# Patient Record
Sex: Male | Born: 1935
Health system: Southern US, Community
[De-identification: ages and names within clinical notes are randomized; demographics above are authoritative.]

## PROBLEM LIST (undated history)

## (undated) DIAGNOSIS — C44101 Unspecified malignant neoplasm of skin of unspecified eyelid, including canthus: Secondary | ICD-10-CM

## (undated) DIAGNOSIS — I10 Essential (primary) hypertension: Secondary | ICD-10-CM

## (undated) DIAGNOSIS — F329 Major depressive disorder, single episode, unspecified: Secondary | ICD-10-CM

## (undated) DIAGNOSIS — C20 Malignant neoplasm of rectum: Secondary | ICD-10-CM

## (undated) DIAGNOSIS — F32A Depression, unspecified: Secondary | ICD-10-CM

## (undated) DIAGNOSIS — E782 Mixed hyperlipidemia: Secondary | ICD-10-CM

## (undated) HISTORY — DX: Mixed hyperlipidemia: E78.2

## (undated) HISTORY — DX: Essential (primary) hypertension: I10

## (undated) HISTORY — DX: Unspecified malignant neoplasm of skin of unspecified eyelid, including canthus: C44.101

## (undated) HISTORY — DX: Depression, unspecified: F32.A

## (undated) HISTORY — DX: Major depressive disorder, single episode, unspecified: F32.9

## (undated) HISTORY — DX: Malignant neoplasm of rectum: C20

---

## 1997-05-02 DIAGNOSIS — C20 Malignant neoplasm of rectum: Secondary | ICD-10-CM

## 1997-05-02 HISTORY — PX: COLON RESECTION: SHX5231

## 1997-05-02 HISTORY — DX: Malignant neoplasm of rectum: C20

## 1997-12-24 ENCOUNTER — Other Ambulatory Visit: Admission: RE | Admit: 1997-12-24 | Discharge: 1997-12-24 | Payer: Self-pay | Admitting: Internal Medicine

## 1997-12-31 ENCOUNTER — Encounter: Admission: RE | Admit: 1997-12-31 | Discharge: 1998-03-31 | Payer: Self-pay | Admitting: Radiation Oncology

## 1998-01-12 ENCOUNTER — Ambulatory Visit (HOSPITAL_COMMUNITY): Admission: RE | Admit: 1998-01-12 | Discharge: 1998-01-12 | Payer: Self-pay

## 1998-04-09 ENCOUNTER — Inpatient Hospital Stay (HOSPITAL_COMMUNITY): Admission: RE | Admit: 1998-04-09 | Discharge: 1998-04-15 | Payer: Self-pay

## 1998-10-11 ENCOUNTER — Encounter: Payer: Self-pay | Admitting: Emergency Medicine

## 1998-10-11 ENCOUNTER — Emergency Department (HOSPITAL_COMMUNITY): Admission: EM | Admit: 1998-10-11 | Discharge: 1998-10-11 | Payer: Self-pay | Admitting: Emergency Medicine

## 1999-01-27 ENCOUNTER — Encounter (INDEPENDENT_AMBULATORY_CARE_PROVIDER_SITE_OTHER): Payer: Self-pay | Admitting: Specialist

## 1999-01-27 ENCOUNTER — Other Ambulatory Visit: Admission: RE | Admit: 1999-01-27 | Discharge: 1999-01-27 | Payer: Self-pay | Admitting: Internal Medicine

## 1999-06-28 ENCOUNTER — Other Ambulatory Visit: Admission: RE | Admit: 1999-06-28 | Discharge: 1999-06-28 | Payer: Self-pay

## 2000-03-15 ENCOUNTER — Encounter (INDEPENDENT_AMBULATORY_CARE_PROVIDER_SITE_OTHER): Payer: Self-pay | Admitting: Specialist

## 2000-03-15 ENCOUNTER — Other Ambulatory Visit: Admission: RE | Admit: 2000-03-15 | Discharge: 2000-03-15 | Payer: Self-pay | Admitting: Internal Medicine

## 2001-03-12 ENCOUNTER — Encounter: Payer: Self-pay | Admitting: Hematology & Oncology

## 2001-03-12 ENCOUNTER — Ambulatory Visit (HOSPITAL_COMMUNITY): Admission: RE | Admit: 2001-03-12 | Discharge: 2001-03-12 | Payer: Self-pay | Admitting: Hematology & Oncology

## 2004-05-18 ENCOUNTER — Ambulatory Visit: Payer: Self-pay | Admitting: Hematology & Oncology

## 2005-05-17 ENCOUNTER — Ambulatory Visit: Payer: Self-pay | Admitting: Hematology & Oncology

## 2005-07-01 ENCOUNTER — Ambulatory Visit: Payer: Self-pay | Admitting: Internal Medicine

## 2005-07-12 ENCOUNTER — Ambulatory Visit: Payer: Self-pay | Admitting: Internal Medicine

## 2006-05-12 ENCOUNTER — Ambulatory Visit: Payer: Self-pay | Admitting: Hematology & Oncology

## 2006-05-17 ENCOUNTER — Ambulatory Visit (HOSPITAL_COMMUNITY): Admission: RE | Admit: 2006-05-17 | Discharge: 2006-05-17 | Payer: Self-pay | Admitting: Hematology & Oncology

## 2006-05-17 LAB — CBC WITH DIFFERENTIAL/PLATELET
Basophils Absolute: 0.1 10*3/uL (ref 0.0–0.1)
EOS%: 7.6 % — ABNORMAL HIGH (ref 0.0–7.0)
HCT: 45.1 % (ref 38.7–49.9)
LYMPH%: 26 % (ref 14.0–48.0)
MCH: 29.8 pg (ref 28.0–33.4)
MCV: 85.6 fL (ref 81.6–98.0)
MONO%: 7.8 % (ref 0.0–13.0)
Platelets: 217 10*3/uL (ref 145–400)
lymph#: 1.3 10*3/uL (ref 0.9–3.3)

## 2006-05-17 LAB — COMPREHENSIVE METABOLIC PANEL
ALT: 33 U/L (ref 0–53)
Alkaline Phosphatase: 71 U/L (ref 39–117)
BUN: 13 mg/dL (ref 6–23)
CO2: 31 mEq/L (ref 19–32)
Calcium: 9.7 mg/dL (ref 8.4–10.5)
Chloride: 102 mEq/L (ref 96–112)
Creatinine, Ser: 0.8 mg/dL (ref 0.40–1.50)
Glucose, Bld: 99 mg/dL (ref 70–99)
Potassium: 3.9 mEq/L (ref 3.5–5.3)
Sodium: 141 mEq/L (ref 135–145)
Total Bilirubin: 1 mg/dL (ref 0.3–1.2)

## 2006-05-17 LAB — LIPID PANEL
HDL: 34 mg/dL — ABNORMAL LOW (ref >39)
Triglycerides: 119 mg/dL (ref <150)

## 2006-05-25 ENCOUNTER — Ambulatory Visit (HOSPITAL_COMMUNITY): Admission: RE | Admit: 2006-05-25 | Discharge: 2006-05-25 | Payer: Self-pay | Admitting: Hematology & Oncology

## 2007-05-14 ENCOUNTER — Ambulatory Visit: Payer: Self-pay | Admitting: Hematology & Oncology

## 2007-05-16 LAB — COMPREHENSIVE METABOLIC PANEL
AST: 25 U/L (ref 0–37)
Calcium: 9.8 mg/dL (ref 8.4–10.5)
Chloride: 101 mEq/L (ref 96–112)
Creatinine, Ser: 0.83 mg/dL (ref 0.40–1.50)
Potassium: 4 mEq/L (ref 3.5–5.3)
Total Bilirubin: 0.7 mg/dL (ref 0.3–1.2)

## 2007-05-16 LAB — LIPID PANEL
Cholesterol: 179 mg/dL (ref 0–200)
HDL: 32 mg/dL — ABNORMAL LOW (ref >39)
LDL Cholesterol: 115 mg/dL — ABNORMAL HIGH (ref 0–99)
VLDL: 32 mg/dL (ref 0–40)

## 2007-05-16 LAB — CBC WITH DIFFERENTIAL/PLATELET
Basophils Absolute: 0 10*3/uL (ref 0.0–0.1)
MCH: 29.5 pg (ref 28.0–33.4)
WBC: 5.1 10*3/uL (ref 4.0–10.0)

## 2007-05-16 LAB — CEA: CEA: <0.5 ng/mL (ref 0.0–5.0)

## 2008-05-02 HISTORY — PX: HAND TUMOR EXCISION: SUR568

## 2008-05-14 ENCOUNTER — Ambulatory Visit: Payer: Self-pay | Admitting: Hematology & Oncology

## 2008-05-15 LAB — CBC WITH DIFFERENTIAL (CANCER CENTER ONLY)
BASO#: 0 10*3/uL (ref 0.0–0.2)
EOS%: 8 % — ABNORMAL HIGH (ref 0.0–7.0)
HCT: 44.9 % (ref 38.7–49.9)
LYMPH#: 1.3 10*3/uL (ref 0.9–3.3)
LYMPH%: 26.8 % (ref 14.0–48.0)
MCH: 29.6 pg (ref 28.0–33.4)
MCHC: 34.1 g/dL (ref 32.0–35.9)
MONO#: 0.4 10*3/uL (ref 0.1–0.9)
NEUT%: 57.1 % (ref 40.0–80.0)
Platelets: 159 10*3/uL (ref 145–400)
RBC: 5.17 10*6/uL (ref 4.20–5.70)
WBC: 4.7 10*3/uL (ref 4.0–10.0)

## 2008-05-15 LAB — LIPID PANEL
HDL: 39 mg/dL — ABNORMAL LOW (ref >39)
LDL Cholesterol: 116 mg/dL — ABNORMAL HIGH (ref 0–99)
Total CHOL/HDL Ratio: 4.5 Ratio

## 2008-05-15 LAB — COMPREHENSIVE METABOLIC PANEL
Alkaline Phosphatase: 67 U/L (ref 39–117)
Calcium: 9.2 mg/dL (ref 8.4–10.5)
Glucose, Bld: 96 mg/dL (ref 70–99)
Potassium: 3.8 mEq/L (ref 3.5–5.3)

## 2008-05-20 ENCOUNTER — Ambulatory Visit: Payer: Self-pay | Admitting: Internal Medicine

## 2008-06-11 ENCOUNTER — Ambulatory Visit: Payer: Self-pay | Admitting: Internal Medicine

## 2008-06-11 ENCOUNTER — Encounter: Payer: Self-pay | Admitting: Internal Medicine

## 2008-06-12 ENCOUNTER — Encounter: Payer: Self-pay | Admitting: Internal Medicine

## 2009-05-12 ENCOUNTER — Ambulatory Visit: Payer: Self-pay | Admitting: Hematology & Oncology

## 2009-05-14 LAB — LIPID PANEL
HDL: 35 mg/dL — ABNORMAL LOW (ref >39)
Total CHOL/HDL Ratio: 5.5 Ratio
VLDL: 32 mg/dL (ref 0–40)

## 2009-05-14 LAB — COMPREHENSIVE METABOLIC PANEL
BUN: 12 mg/dL (ref 6–23)
CO2: 27 mEq/L (ref 19–32)
Chloride: 102 mEq/L (ref 96–112)
Glucose, Bld: 94 mg/dL (ref 70–99)
Potassium: 4.4 mEq/L (ref 3.5–5.3)
Sodium: 141 mEq/L (ref 135–145)
Total Bilirubin: 0.5 mg/dL (ref 0.3–1.2)

## 2009-05-14 LAB — CBC WITH DIFFERENTIAL (CANCER CENTER ONLY)
BASO%: 0.3 % (ref 0.0–2.0)
EOS%: 7.1 % — ABNORMAL HIGH (ref 0.0–7.0)
HCT: 47.6 % (ref 38.7–49.9)
HGB: 15.9 g/dL (ref 13.0–17.1)
LYMPH%: 21.7 % (ref 14.0–48.0)
MCH: 29.4 pg (ref 28.0–33.4)
MCHC: 33.4 g/dL (ref 32.0–35.9)
MCV: 88 fL (ref 82–98)
MONO#: 0.4 10*3/uL (ref 0.1–0.9)
NEUT#: 3.6 10*3/uL (ref 1.5–6.5)
Platelets: 224 10*3/uL (ref 145–400)
RDW: 12 % (ref 10.5–14.6)

## 2009-05-14 LAB — CEA: CEA: 0.7 ng/mL (ref 0.0–5.0)

## 2009-08-25 ENCOUNTER — Ambulatory Visit: Payer: Self-pay | Admitting: Internal Medicine

## 2009-08-25 DIAGNOSIS — J4540 Moderate persistent asthma, uncomplicated: Secondary | ICD-10-CM | POA: Insufficient documentation

## 2009-08-25 DIAGNOSIS — R0609 Other forms of dyspnea: Secondary | ICD-10-CM

## 2009-08-25 DIAGNOSIS — J45909 Unspecified asthma, uncomplicated: Secondary | ICD-10-CM | POA: Insufficient documentation

## 2009-08-25 DIAGNOSIS — I10 Essential (primary) hypertension: Secondary | ICD-10-CM | POA: Insufficient documentation

## 2009-08-25 DIAGNOSIS — R0989 Other specified symptoms and signs involving the circulatory and respiratory systems: Secondary | ICD-10-CM | POA: Insufficient documentation

## 2009-08-26 LAB — CONVERTED CEMR LAB
Basophils Absolute: 0 10*3/uL (ref 0.0–0.1)
Calcium: 9.5 mg/dL (ref 8.4–10.5)
GFR calc non Af Amer: 99.76 mL/min (ref >60)
HCT: 45.7 % (ref 39.0–52.0)
Hemoglobin: 15.9 g/dL (ref 13.0–17.0)
Lymphs Abs: 1.3 10*3/uL (ref 0.7–4.0)
Monocytes Absolute: 0.5 10*3/uL (ref 0.1–1.0)
Monocytes Relative: 7.5 % (ref 3.0–12.0)
Neutrophils Relative %: 64.3 % (ref 43.0–77.0)
Potassium: 3.8 meq/L (ref 3.5–5.1)
RDW: 13.4 % (ref 11.5–14.6)
Sodium: 141 meq/L (ref 135–145)

## 2009-10-06 ENCOUNTER — Ambulatory Visit: Payer: Self-pay | Admitting: Internal Medicine

## 2010-03-22 ENCOUNTER — Ambulatory Visit: Payer: Self-pay | Admitting: Internal Medicine

## 2010-05-02 HISTORY — PX: OTHER SURGICAL HISTORY: SHX169

## 2010-05-11 ENCOUNTER — Ambulatory Visit: Payer: Self-pay | Admitting: Hematology & Oncology

## 2010-05-13 LAB — CBC WITH DIFFERENTIAL (CANCER CENTER ONLY)
BASO#: 0 10*3/uL (ref 0.0–0.2)
BASO%: 0.9 % (ref 0.0–2.0)
EOS%: 6.5 % (ref 0.0–7.0)
Eosinophils Absolute: 0.3 10*3/uL (ref 0.0–0.5)
HCT: 46.9 % (ref 38.7–49.9)
HGB: 16 g/dL (ref 13.0–17.1)
LYMPH#: 1.5 10*3/uL (ref 0.9–3.3)
LYMPH%: 29.2 % (ref 14.0–48.0)
MCH: 30 pg (ref 28.0–33.4)
MCHC: 34 g/dL (ref 32.0–35.9)
MCV: 88 fL (ref 82–98)
MONO#: 0.3 10*3/uL (ref 0.1–0.9)
MONO%: 6.8 % (ref 0.0–13.0)
NEUT#: 2.8 10*3/uL (ref 1.5–6.5)
NEUT%: 56.6 % (ref 40.0–80.0)
Platelets: 205 10*3/uL (ref 145–400)
RBC: 5.32 10*6/uL (ref 4.20–5.70)
RDW: 12 % (ref 10.5–14.6)
WBC: 5 10*3/uL (ref 4.0–10.0)

## 2010-05-13 LAB — COMPREHENSIVE METABOLIC PANEL
ALT: 22 U/L (ref 0–53)
AST: 19 U/L (ref 0–37)
Albumin: 4.2 g/dL (ref 3.5–5.2)
Alkaline Phosphatase: 65 U/L (ref 39–117)
BUN: 12 mg/dL (ref 6–23)
CO2: 28 mEq/L (ref 19–32)
Calcium: 9.6 mg/dL (ref 8.4–10.5)
Chloride: 103 mEq/L (ref 96–112)
Creatinine, Ser: 0.83 mg/dL (ref 0.40–1.50)
Glucose, Bld: 97 mg/dL (ref 70–99)
Potassium: 4.2 mEq/L (ref 3.5–5.3)
Sodium: 141 mEq/L (ref 135–145)
Total Bilirubin: 0.8 mg/dL (ref 0.3–1.2)
Total Protein: 7.3 g/dL (ref 6.0–8.3)

## 2010-05-13 LAB — LIPID PANEL
Cholesterol: 201 mg/dL — ABNORMAL HIGH (ref 0–200)
HDL: 34 mg/dL — ABNORMAL LOW (ref >39)
LDL Cholesterol: 136 mg/dL — ABNORMAL HIGH (ref 0–99)
Total CHOL/HDL Ratio: 5.9 Ratio
Triglycerides: 154 mg/dL — ABNORMAL HIGH (ref <150)
VLDL: 31 mg/dL (ref 0–40)

## 2010-05-13 LAB — VITAMIN D 25 HYDROXY (VIT D DEFICIENCY, FRACTURES): Vit D, 25-Hydroxy: 26 ng/mL — ABNORMAL LOW (ref 30–89)

## 2010-06-01 NOTE — Assessment & Plan Note (Signed)
Summary: Pulmonary/  final f/u ov   Copy to:  Self Primary Provider/Referring Provider:  Dr. Jeannetta Nap  CC:  Dyspnea- resolved.  History of Present Illness: 32 yowm never  smoker with ? breathing problems as child exposed to second hand smoke until 2000,  August 25, 2009 indolent onset x 10 years progressive  doe x fast walk or heavy yard work and after heavy meal assoc with noct wheeze. occ yellow mucus. not typically flaring nocturnally as long as remembers to take serevent at hs.  rec symbicort 80 and ppi and hs h2 and diet > 100%   October 06, 2009 6 wk followup with PFT's.  Pt states that he feels his breathing is much better.  no limitations during day, no noct c/os'.    rec try off prilosec and pepcid  March 22, 2010 ov cc sob and cough resolved 100%  on symbicort.  Pt denies any significant sore throat, dysphagia, itching, sneezing,  nasal congestion or excess secretions,  fever, chills, sweats, unintended wt loss, pleuritic or exertional cp, hempoptysis, change in activity tolerance  orthopnea pnd or leg swelling. and cough resolved 100%  on symbicort.  Pt denies any significant sore throat, dysphagia, itching, sneezing,  nasal congestion or excess secretions,  fever, chills, sweats, unintended wt loss, pleuritic or exertional cp, hempoptysis, change in activity tolerance  orthopnea pnd or leg swelling.    Current Medications (verified): 1)  Hydrochlorothiazide 50 Mg Tabs (Hydrochlorothiazide) .... 1/2 Once Daily 2)  Symbicort 80-4.5 Mcg/act  Aero (Budesonide-Formoterol Fumarate) .... 2 Puffs First Thing  in Am and 2 Puffs Again in Pm About 12 Hours Later  Allergies (verified): No Known Drug Allergies  Past History:  Past Medical History: Hx of COLON CANCER (ICD-153.9) HYPERTENSION (ICD-401.9) Chronic Athma    - HFA 77 year old male Weight:  > 90% October 06, 2009     - Allergy profile sent August 25, 2009 >>>   neg    - PFTs wnl October 06, 2009 except for nonspecific reduction mid flows  Vital Signs:  Patient profile:   77 year old male Weight:      161.25 pounds O2 Sat:      95 % on Room air Temp:     97.8 degrees F oral Pulse rate:   67 / minute BP sitting:   124 / 62  (left arm)  Vitals Entered By: Vernie Murders (March 22, 2010 9:06 AM)  O2  Flow:  Room air  Physical Exam  Additional Exam:  amb wm with weathered facies nad wt 150 August 25, 2009 > 161 March 22, 2010  HEENT: nl dentition, turbinates, and orophanx. Nl external ear canals without cough reflex NECK :  without JVD/Nodes/TM/ nl carotid upstrokes bilaterally LUNGS: no acc muscle use, clear to A and P bilaterally without cough on insp or exp maneuvers CV:  RRR  no s3 or murmur or increase in P2, no edema  ABD:  soft and nontender with nl excursion in the supine position. No bruits or organomegaly, bowel sounds nl MS:  warm without deformities, calf tenderness, cyanosis or clubbing SKIN: warm and dry without lesions        Impression & Recommendations:  Problem # 1:  ASTHMA, UNSPECIFIED (ICD-493.90) All goals of asthma met including optimal function and elimination of symptoms with minimum need for rescue therapy. Contingencies discussed today including the rule of two's.    Each maintenance medication was reviewed in detail including most importantly the difference between maintenance and as needed and under what circumstances the prns are to be used.  f/u can be prn  Patient Instructions: 1)  Ok to reduce symbicort to 1-2 puffs every 12 hours 2)   Please schedule a follow-up  appointment as needed - ok to let Dr Jeannetta Nap refill your symbicort if you are happy with it Prescriptions: SYMBICORT 80-4.5 MCG/ACT  AERO (BUDESONIDE-FORMOTEROL FUMARATE) 2 puffs first thing  in am and 2 puffs again in pm about 12 hours later  #3 x 3   Entered and Authorized by:   Nyoka Cowden MD   Signed by:   Nyoka Cowden MD on 03/22/2010   Method used:   Electronically to        [facility]  [address] [phone] (retail)       [address]       [place], [place]  [id number]       Ph: [phone] or [phone]       Fax: [phone]   RxID:   [id number]   Appended Document: Orders Update    Clinical Lists Changes       Appended  Document: Orders Update    Clinical Lists Changes  Orders: Added new Service order of Est. Patient Level III (40347) - Signed

## 2010-06-01 NOTE — Assessment & Plan Note (Signed)
Summary: Pulmonary/ new pt eval for asthma, try symbicort 80   Visit Type:  Initial Consult Copy to:  Self Primary Provider/Referring Provider:  Dr. Jeannetta Nap  CC:  Dyspnea and Cough.  History of Present Illness: 25 yowm never  smoker with ? breathing problems as child exposed to second hand smoke until 2000,  August 25, 2009 indolent onset x 10 years progressive  doe x fast walk or heavy yard work and after heavy meal assoc with noct wheeze. occ yellow mucus. not typically flaring nocturnally as long as remembers to take serevent at hs.  Pt denies any significant sore throat, dysphagia, itching, sneezing,  nasal congestion or excess secretions,  fever, chills, sweats, unintended wt loss, pleuritic or exertional cp, hempoptysis, change in activity tolerance  orthopnea pnd or leg swelling Pt also denies any obvious fluctuation in symptoms with weather or environmental change or other alleviating or aggravating factors.       Current Medications (verified): 1)  Hydrochlorothiazide 50 Mg Tabs (Hydrochlorothiazide) .... 1/2 Once Daily 2)  Serevent Diskus 50 Mcg/dose Aepb (Salmeterol Xinafoate) .Marland Kitchen.. 1 Puff Every 12 Hours  Allergies (verified): No Known Drug Allergies  Past History:  Past Medical History:  Hx of COLON CANCER (ICD-153.9) HYPERTENSION (ICD-401.9) Chronic Athma    - HFA 75% after teaching August 25, 2009     - Allergy profile sent August 25, 2009 >>>  Past Surgical History: Colon surgery 1999  Family History: Asthma- PGM  Social History: Married Jimmye Norman Never smoker Quit ETOH in 1999  Review of Systems       The patient complains of shortness of breath with activity, productive cough, and change in color of mucus.  The patient denies shortness of breath at rest, non-productive cough, coughing up blood, chest pain, irregular heartbeats, acid heartburn, indigestion, loss of appetite, weight change, abdominal pain, difficulty swallowing, sore throat, tooth/dental  problems, headaches, nasal congestion/difficulty breathing through nose, sneezing, itching, ear ache, anxiety, depression, hand/feet swelling, joint stiffness or pain, rash, and fever.    Vital Signs:  Patient profile:   77 year old male Height:      68 inches Weight:      150 pounds BMI:     22.89 O2 Sat:      92 % on Room air Temp:     97.8 degrees F oral Pulse rate:   70 / minute BP sitting:   120 / 72  (left arm)  Vitals Entered By: Vernie Murders (August 25, 2009 10:00 AM)  O2 Flow:  Room air  Physical Exam  Additional Exam:  amb wm with weathered facies nad wt 150 August 25, 2009 HEENT: nl dentition, turbinates, and orophanx. Nl external ear canals without cough reflex NECK :  without JVD/Nodes/TM/ nl carotid upstrokes bilaterally LUNGS: no acc muscle use, clear to A and P bilaterally without cough on insp or exp maneuvers CV:  RRR  no s3 or murmur or increase in P2, no edema  ABD:  soft and nontender with nl excursion in the supine position. No bruits or organomegaly, bowel sounds nl MS:  warm without deformities, calf tenderness, cyanosis or clubbing SKIN: warm and dry without lesions   NEURO:  alert, approp, no deficits     White Cell Count          6.2 K/uL                    4.5-10.5   Red Cell Count  5.20 Mil/uL                 4.22-5.81   Hemoglobin                15.9 g/dL                   16.1-09.6   Hematocrit                45.7 %                      39.0-52.0   MCV                       88.0 fl                     78.0-100.0   MCHC                      34.7 g/dL                   04.5-40.9   RDW                       13.4 %                      11.5-14.6   Platelet Count            187.0 K/uL                  150.0-400.0   Neutrophil %              64.3 %                      43.0-77.0   Lymphocyte %              20.6 %                      12.0-46.0   Monocyte %                7.5 %                       3.0-12.0   Eosinophils%         [H]  7.0 %                        0.0-5.0   Basophils %               0.6 %                       0.0-3.0   Neutrophill Absolute      4.0 K/uL                    1.4-7.7   Lymphocyte Absolute       1.3 K/uL                    0.7-4.0   Monocyte Absolute         0.5 K/uL                    0.1-1.0  Eosinophils, Absolute  0.4 K/uL                    0.0-0.7   Basophils Absolute        0.0 K/uL                    0.0-0.1  Tests: (2) BMP (METABOL)   Sodium                    141 mEq/L                   135-145   Potassium                 3.8 mEq/L                   3.5-5.1   Chloride                  103 mEq/L                   96-112   Carbon Dioxide            30 mEq/L                    19-32   Glucose                   85 mg/dL                    16-10   BUN                       15 mg/dL                    9-60   Creatinine                0.8 mg/dL                   4.5-4.0   Calcium                   9.5 mg/dL                   9.8-11.9   GFR                       99.76 mL/min                >60  Tests: (3) B-Type Natiuretic Peptide (BNPR)  B-Type Natriuetic Peptide                             45.0 pg/mL                  0.0-100.0  CXR  Procedure date:  08/25/2009  Findings:      Comparison: [facility] chest x-rays 05/17/2006 and 05/25/2006.   Findings: Lungs are clear.  Heart size and configuration remain normal.  Mediastinum, hila, pleura and osseous structures remain normal for age.   IMPRESSION: Stable.  No active cardiopulmonary disease  Impression & Recommendations:  Problem # 1:  ASTHMA, UNSPECIFIED (ICD-493.90)  DDX of  difficult airways managment all start with A and  include Adherence, Ace Inhibitors, Acid Reflux, Active Sinus Disease, Alpha 1 Antitripsin deficiency, Anxiety masquerading as Airways dz,  ABPA,  allergy(esp in young), Aspiration (  esp in elderly), Adverse effects of DPI,  Active smokers, plus one B  = Beta blocker use..    Adherence:  I spent extra time with the patient today explaining optimal mdi  technique.  This improved from  25-75%, try symbicort  Acid reflux:  rx x 6 weeks only to establish baseline, diet reviewed  Allergies  Eos up so check allergy profile   Each maintenance medication was reviewed in detail including most importantly the difference between maintenance prns and under what circumstances the prns are to be used.  In addition, these two groups (for which the patient should keep up with refills) were distinguished from a third group :  meds that are used only short term with the intent to complete a course of therapy and then not refill them.  The med list was then fully reconciled and reorganized to reflect this important distinction.   Medications Added to Medication List This Visit: 1)  Hydrochlorothiazide 50 Mg Tabs (Hydrochlorothiazide) .... 1/2 once daily 2)  Serevent Diskus 50 Mcg/dose Aepb (Salmeterol xinafoate) .Marland Kitchen.. 1 puff every 12 hours 3)  Symbicort 80-4.5 Mcg/act Aero (Budesonide-formoterol fumarate) .... Take one 30-60 min before first and last meals of the day 4)  Prilosec Otc 20 Mg Tbec (Omeprazole magnesium) .... Take  one 30-60 min before first meal of the day 5)  Pepcid Ac Maximum Strength 20 Mg Tabs (Famotidine) .... One at bedtime  Other Orders: TLB-CBC Platelet - w/Differential (85025-CBCD) TLB-BMP (Basic Metabolic Panel-BMET) (80048-METABOL) TLB-BNP (B-Natriuretic Peptide) (83880-BNPR) T-Allergy Profile Region II-[place], [place], [place], [place], [place] (5484) T-2 View CXR (71020TC) New Patient Level V [id number])  Patient Instructions: 1)  stop serevent 2)  start symbicort 80 2 puffs first thing  in am and 2 puffs again in pm about 12 hours later and fill presription if happy with it 3)  start prilosec Take  one 30-60 min before first meal of the day and pepcid 20 mg at bedtime 4)  Please schedule a follow-up appointment in 6 weeks, sooner if needed with PFT's on return 5)  GERD  (REFLUX)  is a common cause of respiratory symptoms. It commonly presents without heartburn and can be treated with medication, but also with lifestyle changes including avoidance of late meals, excessive alcohol, smoking cessation, and avoid fatty foods, chocolate, peppermint, colas, red wine, and acidic juices such as orange juice. NO MINT OR MENTHOL PRODUCTS SO NO COUGH DROPS  6)  USE SUGARLESS CANDY INSTEAD (jolley ranchers)  7)  NO OIL BASED VITAMINS  Prescriptions: SYMBICORT 80-4.5 MCG/ACT  AERO (BUDESONIDE-FORMOTEROL FUMARATE) Take one 30-60 min before first and last meals of the day  #1 x 11   Entered and Authorized by:   Nyoka Cowden MD   Signed by:   Nyoka Cowden MD on 08/25/2009   Method used:   Print then Give to Patient   RxID:   [id number]    CXR  Procedure date:  08/25/2009  Findings:      Comparison: [facility] chest x-rays 05/17/2006 and 05/25/2006.   Findings: Lungs are clear.  Heart size and configuration remain normal.  Mediastinum, hila, pleura and osseous structures remain normal for age.   IMPRESSION: Stable.  No active cardiopulmonary disease

## 2010-06-01 NOTE — Assessment & Plan Note (Signed)
Summary: Pulmonary/ f/u ov with hfa now at 90% effective   Copy to:  Self Primary Provider/Referring Provider:  Dr. Jeannetta Nap  CC:  6 wk followup with PFT's.  Pt states that he feels his breathing is much better.  No complaints today.Marland Kitchen  History of Present Illness: 85 yowm never  smoker with ? breathing problems as child exposed to second hand smoke until 2000,  August 25, 2009 indolent onset x 10 years progressive  doe x fast walk or heavy yard work and after heavy meal assoc with noct wheeze. occ yellow mucus. not typically flaring nocturnally as long as remembers to take serevent at hs.  rec symbicort 80 and ppi and hs h2 and diet > 100%   October 06, 2009 6 wk followup with PFT's.  Pt states that he feels his breathing is much better.  no limitations during day, no noct c/os'.  Pt denies any significant sore throat, dysphagia, itching, sneezing,  nasal congestion or excess secretions,  fever, chills, sweats, unintended wt loss, pleuritic or exertional cp, hempoptysis, change in activity tolerance  orthopnea pnd or leg swelling Pt also denies any obvious fluctuation in symptoms with weather or environmental change or other alleviating or aggravating factors.       Allergies (verified): No Known Drug Allergies  Past History:  Past Medical History: Hx of COLON CANCER (ICD-153.9) HYPERTENSION (ICD-401.9) Chronic Athma    - HFA 75% after teaching August 25, 2009  > 90% October 06, 2009     - Allergy profile sent August 25, 2009 >>> neg    - PFTs wnl October 06, 2009 except for nonspecific reduction mid flows  Vital Signs:  Patient profile:   77 year old male Weight:      148 pounds O2 Sat:      96 % on Room air Temp:     98.0 degrees F oral Pulse rate:   69 / minute BP sitting:   140 / 80  (left arm)  Vitals Entered By: Vernie Murders (October 06, 2009 10:01 AM)  O2 Flow:  Room air   Impression & Recommendations:  Problem # 1:  ASTHMA, UNSPECIFIED (ICD-493.90) All goals of asthma met  including optimal function and elimination of symptoms with minimum need for rescue therapy. Contingencies discussed today including the rule of two's.   I spent extra time with the patient today explaining optimal mdi  technique.  This improved from  75-90%   Each maintenance medication was reviewed in detail including most importantly the difference between maintenance and as needed and under what circumstances the prns are useed. See instructions for specific recommendations   Problem # 2:  DYSPNEA (ICD-786.09)  Not clear how much of his symtoms were gerd related, try off ppi but continue diet.  NB the  ramp to expected improvement (and for that matter, worsening, if a chronic effective medication is stopped)  can be measured in weeks, not days, a common misconception because this is not Heartburn with no immediate cause and effect relationship so that response to therapy or lack thereof can be very difficult to assess.   Orders: Est. Patient Level III (16010)  Medications Added to Medication List This Visit: 1)  Symbicort 80-4.5 Mcg/act Aero (Budesonide-formoterol fumarate) .... 2 puffs first thing  in am and 2 puffs again in pm about 12 hours later  Other Orders: HFA Instruction [phone])  Patient Instructions: 1)  GERD (REFLUX)  is a common cause of respiratory symptoms.  It commonly presents without heartburn and can be treated with medication, but also with lifestyle changes including avoidance of late meals, excessive alcohol, smoking cessation, and avoid fatty foods, chocolate, peppermint, colas, red wine, and acidic juices such as orange juice. NO MINT OR MENTHOL PRODUCTS SO NO COUGH DROPS  2)  USE SUGARLESS CANDY INSTEAD (jolley ranchers)  3)  NO OIL BASED VITAMINS  4)  Try off prilosec and pepcid but if symptoms flare please call 5)  Work on perfecting  inhaler technique:  relax and blow all the way out then take a nice smooth deep breath back in, triggering the inhaler at same  time you start breathing in  6)  Return to office in 3 months, sooner if needed

## 2010-06-01 NOTE — Miscellaneous (Signed)
Summary: Orders Update pft charges  Clinical Lists Changes  Orders: Added new Service order of Carbon Monoxide diffusing w/capacity (94720) - Signed Added new Service order of Lung Volumes (94240) - Signed Added new Service order of Spirometry (Pre & Post) (94060) - Signed 

## 2010-10-18 ENCOUNTER — Other Ambulatory Visit: Payer: Self-pay | Admitting: Ophthalmology

## 2011-01-18 ENCOUNTER — Other Ambulatory Visit: Payer: Self-pay | Admitting: Dermatology

## 2011-05-12 ENCOUNTER — Other Ambulatory Visit: Payer: Self-pay | Admitting: Hematology & Oncology

## 2011-05-12 ENCOUNTER — Ambulatory Visit (HOSPITAL_BASED_OUTPATIENT_CLINIC_OR_DEPARTMENT_OTHER): Payer: Medicare Other | Admitting: Hematology & Oncology

## 2011-05-12 ENCOUNTER — Encounter: Payer: Self-pay | Admitting: Hematology & Oncology

## 2011-05-12 ENCOUNTER — Other Ambulatory Visit (HOSPITAL_BASED_OUTPATIENT_CLINIC_OR_DEPARTMENT_OTHER): Payer: Medicare Other | Admitting: Lab

## 2011-05-12 VITALS — BP 143/87 | HR 62 | Temp 97.0°F | Ht 68.0 in | Wt 151.0 lb

## 2011-05-12 DIAGNOSIS — F411 Generalized anxiety disorder: Secondary | ICD-10-CM

## 2011-05-12 DIAGNOSIS — C20 Malignant neoplasm of rectum: Secondary | ICD-10-CM | POA: Insufficient documentation

## 2011-05-12 DIAGNOSIS — Z85048 Personal history of other malignant neoplasm of rectum, rectosigmoid junction, and anus: Secondary | ICD-10-CM

## 2011-05-12 LAB — COMPREHENSIVE METABOLIC PANEL
AST: 19 U/L (ref 0–37)
Albumin: 4.3 g/dL (ref 3.5–5.2)
BUN: 12 mg/dL (ref 6–23)
BUN: 12 mg/dL (ref 6–23)
CO2: 28 mEq/L (ref 19–32)
CO2: 28 mEq/L (ref 19–32)
Calcium: 9.9 mg/dL (ref 8.4–10.5)
Calcium: 9.9 mg/dL (ref 8.4–10.5)
Chloride: 103 mEq/L (ref 96–112)
Chloride: 103 mEq/L (ref 96–112)
Creatinine, Ser: 0.86 mg/dL (ref 0.50–1.35)
Creatinine, Ser: 0.86 mg/dL (ref 0.50–1.35)
Glucose, Bld: 98 mg/dL (ref 70–99)
Potassium: 3.9 mEq/L (ref 3.5–5.3)
Potassium: 3.9 mEq/L (ref 3.5–5.3)

## 2011-05-12 LAB — CBC WITH DIFFERENTIAL (CANCER CENTER ONLY)
BASO#: 0 10*3/uL (ref 0.0–0.2)
BASO%: 0.6 % (ref 0.0–2.0)
LYMPH%: 21.3 % (ref 14.0–48.0)
MONO#: 0.5 10*3/uL (ref 0.1–0.9)
NEUT%: 62.1 % (ref 40.0–80.0)

## 2011-05-12 LAB — CEA: CEA: <0.5 ng/mL (ref 0.0–5.0)

## 2011-05-12 LAB — SEDIMENTATION RATE: Sed Rate: 4 mm/hr (ref 0–16)

## 2011-05-12 MED ORDER — CLORAZEPATE DIPOTASSIUM 3.75 MG PO TABS: 3.7500 mg | ORAL_TABLET | Freq: Two times a day (BID) | ORAL | Status: AC | PRN

## 2011-05-12 NOTE — Progress Notes (Signed)
This office note has been dictated.

## 2011-05-12 NOTE — Progress Notes (Signed)
CC:   [name], M.D.  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  CURRENT THERAPY:  Observation.  INTERIM HISTORY:  [name] comes in for his yearly followup. Unfortunately, his wife passed away late last year.  She was in the hospital for 6 weeks.  She had bad COPD with overlying pneumonia.  She was over at [facility].  It has been a tough situation for [name].  They have been married for about 50 years.  He is doing okay.  He had some surgery for his right eye.  He was having some eyelid problems with the lower eyelid.  He does not want any more surgery for this.  It is not causing him any problems.  He is having no issues with fevers, sweats or chills.  There has been no change in bowel or bladder habits.  He has had no cardiac issues.  PHYSICAL EXAM:  General:  This is a well-developed, well-nourished white gentleman in no obvious distress.  Vital Signs:  Temperature of 97, pulse 62, respiratory rate 18, blood pressure 143/87.  Weight is 151. Head/Neck:  Exam shows a normocephalic, atraumatic skull.  There are no ocular or oral lesions.  There are no palpable cervical or supraclavicular lymph nodes.  His ocular exam shows good ocular muscle movement bilaterally. pupils react appropriately. He does have some slight inflammation of the right lower eyelid.  Lungs:  Clear bilaterally.  Cardiac:  Regular rate and rhythm with a normal S1, S2. There are no murmurs, rubs or bruits.  Abdomen:  Soft with good bowel sounds.  There is no palpable abdominal mass.  There is no fluid wave. There is no palpable hepatosplenomegaly.  Back:  No tenderness of the spine, ribs or hips.  Extremities:  No clubbing, cyanosis or edema. Neurologic:  Exam shows no focal neurological deficits.  Skin:  Exam does show some actinic keratoses.  LABORATORY STUDIES:  White cell count is 5.1, hemoglobin 15.6, hematocrit 44.6, platelet count 181.  IMPRESSION:  [name]  is a 78 year old gentleman with a past history of stage II adenocarcinoma of the rectum.  He is I think about 10 years out from treatment.  He received neoadjuvant chemo and radiation.  He did not require any adjuvant therapy.  We will go ahead and give him a little something to help with his nerves.  I will put him on a little Tranxene (3.75 mg q.8 h. p.r.n.) so that he can get a little rest and have a little bit of calm.  We will plan to get him back to see [place] in another year.    ______________________________ Josph Macho, M.D. PRE/MEDQ  D:  05/12/2011  T:  05/12/2011  Job:  [id number]  ADDENDUM:  CEA is <0.5.

## 2011-05-18 ENCOUNTER — Encounter: Payer: Self-pay | Admitting: Internal Medicine

## 2011-05-23 ENCOUNTER — Encounter: Payer: Self-pay | Admitting: Internal Medicine

## 2011-06-13 ENCOUNTER — Ambulatory Visit (AMBULATORY_SURGERY_CENTER): Payer: Medicare Other | Admitting: *Deleted

## 2011-06-13 VITALS — Ht 68.0 in | Wt 150.6 lb

## 2011-06-13 DIAGNOSIS — Z1211 Encounter for screening for malignant neoplasm of colon: Secondary | ICD-10-CM

## 2011-06-13 MED ORDER — PEG-KCL-NACL-NASULF-NA ASC-C 100 G PO SOLR: ORAL | Status: DC

## 2011-06-27 ENCOUNTER — Ambulatory Visit (AMBULATORY_SURGERY_CENTER): Payer: Medicare Other | Admitting: Internal Medicine

## 2011-06-27 ENCOUNTER — Encounter: Payer: Self-pay | Admitting: Internal Medicine

## 2011-06-27 VITALS — BP 152/74 | HR 57 | Temp 96.8°F | Resp 15 | Ht 68.0 in | Wt 150.0 lb

## 2011-06-27 DIAGNOSIS — K633 Ulcer of intestine: Secondary | ICD-10-CM

## 2011-06-27 DIAGNOSIS — K5730 Diverticulosis of large intestine without perforation or abscess without bleeding: Secondary | ICD-10-CM

## 2011-06-27 DIAGNOSIS — K626 Ulcer of anus and rectum: Secondary | ICD-10-CM

## 2011-06-27 DIAGNOSIS — Z86010 Personal history of colonic polyps: Secondary | ICD-10-CM

## 2011-06-27 DIAGNOSIS — Z1211 Encounter for screening for malignant neoplasm of colon: Secondary | ICD-10-CM

## 2011-06-27 MED ORDER — SODIUM CHLORIDE 0.9 % IV SOLN: 500.0000 mL | INTRAVENOUS | Status: DC

## 2011-06-27 NOTE — Progress Notes (Deleted)
Patient ID: [name], male   DOB: 11/17/1930, 78 y.o.   MRN: [id number]

## 2011-06-27 NOTE — Op Note (Signed)
[facility] [address] [place], [place]  [address]  COLONOSCOPY PROCEDURE REPORT  PATIENT:  Alyus, Mofield  MR#:  [id number] BIRTHDATE:  02-14-1931, 80 yrs. old  GENDER:  male ENDOSCOPIST:  Wilhemina Bonito. Eda Keys, MD REF. BY:  Surveillance Program / Recall PROCEDURE DATE:  06/27/2011 PROCEDURE:  Colonoscopy with biopsies ASA CLASS:  Class II INDICATIONS:  history of colon cancer, history of pre-cancerous (adenomatous) colon polyps, surveillance and high-risk screening ; rectal cancer 1999 s/p LAR; multiple f/u exams (last 06-2008) MEDICATIONS:   MAC sedation, administered by CRNA, propofol (Diprivan) 150 mg IV  DESCRIPTION OF PROCEDURE:   After the risks benefits and alternatives of the procedure were thoroughly explained, informed consent was obtained.  Digital rectal exam was performed and revealed no abnormalities.   The LB [id number] endoscope was introduced through the anus and advanced to the cecum, which was identified by both the appendix and ileocecal valve, without limitations.  The quality of the prep was adequate, using MoviPrep.  The instrument was then slowly withdrawn as the colon was fully examined. <<PROCEDUREIMAGES>>  FINDINGS:  A 2cm friable ulcer was found anastamosis. Multiple bx taken  Severe diverticulosis was found throughout the colon. Retroflexed views in the rectum revealed not done due to anatomy. The time to cecum =2:52  minutes. The scope was then withdrawn in 8:30  minutes from the cecum and the procedure completed.  COMPLICATIONS:  None  ENDOSCOPIC IMPRESSION: 1) Rectal Ulcer at the anastamosis (has had in past intermittently) - s/p bx 2) Severe diverticulosis throughout the colon  RECOMMENDATIONS: 1) Await biopsy results  ______________________________ Wilhemina Bonito. Eda Keys, MD  CC:  Windle Guard, MD;  The Patient  n. eSIGNED:   Wilhemina Bonito. Eda Keys at 06/27/2011 09:51 AM  Sharyon Medicus, [phone]

## 2011-06-27 NOTE — Patient Instructions (Signed)

## 2011-06-27 NOTE — Progress Notes (Signed)
Patient did not experience any of the following events: a burn prior to discharge; a fall within the facility; wrong site/side/patient/procedure/implant event; or a hospital transfer or hospital admission upon discharge from the facility. (G8907) Patient did not have preoperative order for IV antibiotic SSI prophylaxis. (G8918)  

## 2011-06-27 NOTE — Progress Notes (Signed)
Pt uses inhaler for asthma as needed.  He cannot remember the name.

## 2011-06-28 ENCOUNTER — Telehealth: Payer: Self-pay | Admitting: *Deleted

## 2011-06-28 NOTE — Telephone Encounter (Signed)
  Follow up Call-  Call back number 06/27/2011  Post procedure Call Back phone  # [phone]  Permission to leave phone message Yes     Left message to call us if he is having problems or has any questions

## 2011-06-30 ENCOUNTER — Encounter: Payer: Self-pay | Admitting: Internal Medicine

## 2011-10-05 ENCOUNTER — Other Ambulatory Visit: Payer: Self-pay | Admitting: Ophthalmology

## 2011-10-12 IMAGING — CR DG CHEST 2V
2 series · 2 of 2 positions shown · non-contrast
Comparison: [HOSPITAL] chest x-rays 05/17/2006 and
05/25/2006.

CLINICAL DATA: Shortness of breath, hypertension. History colon
cancer.

CHEST - 2 VIEW

[view not recorded (1 of 2)]
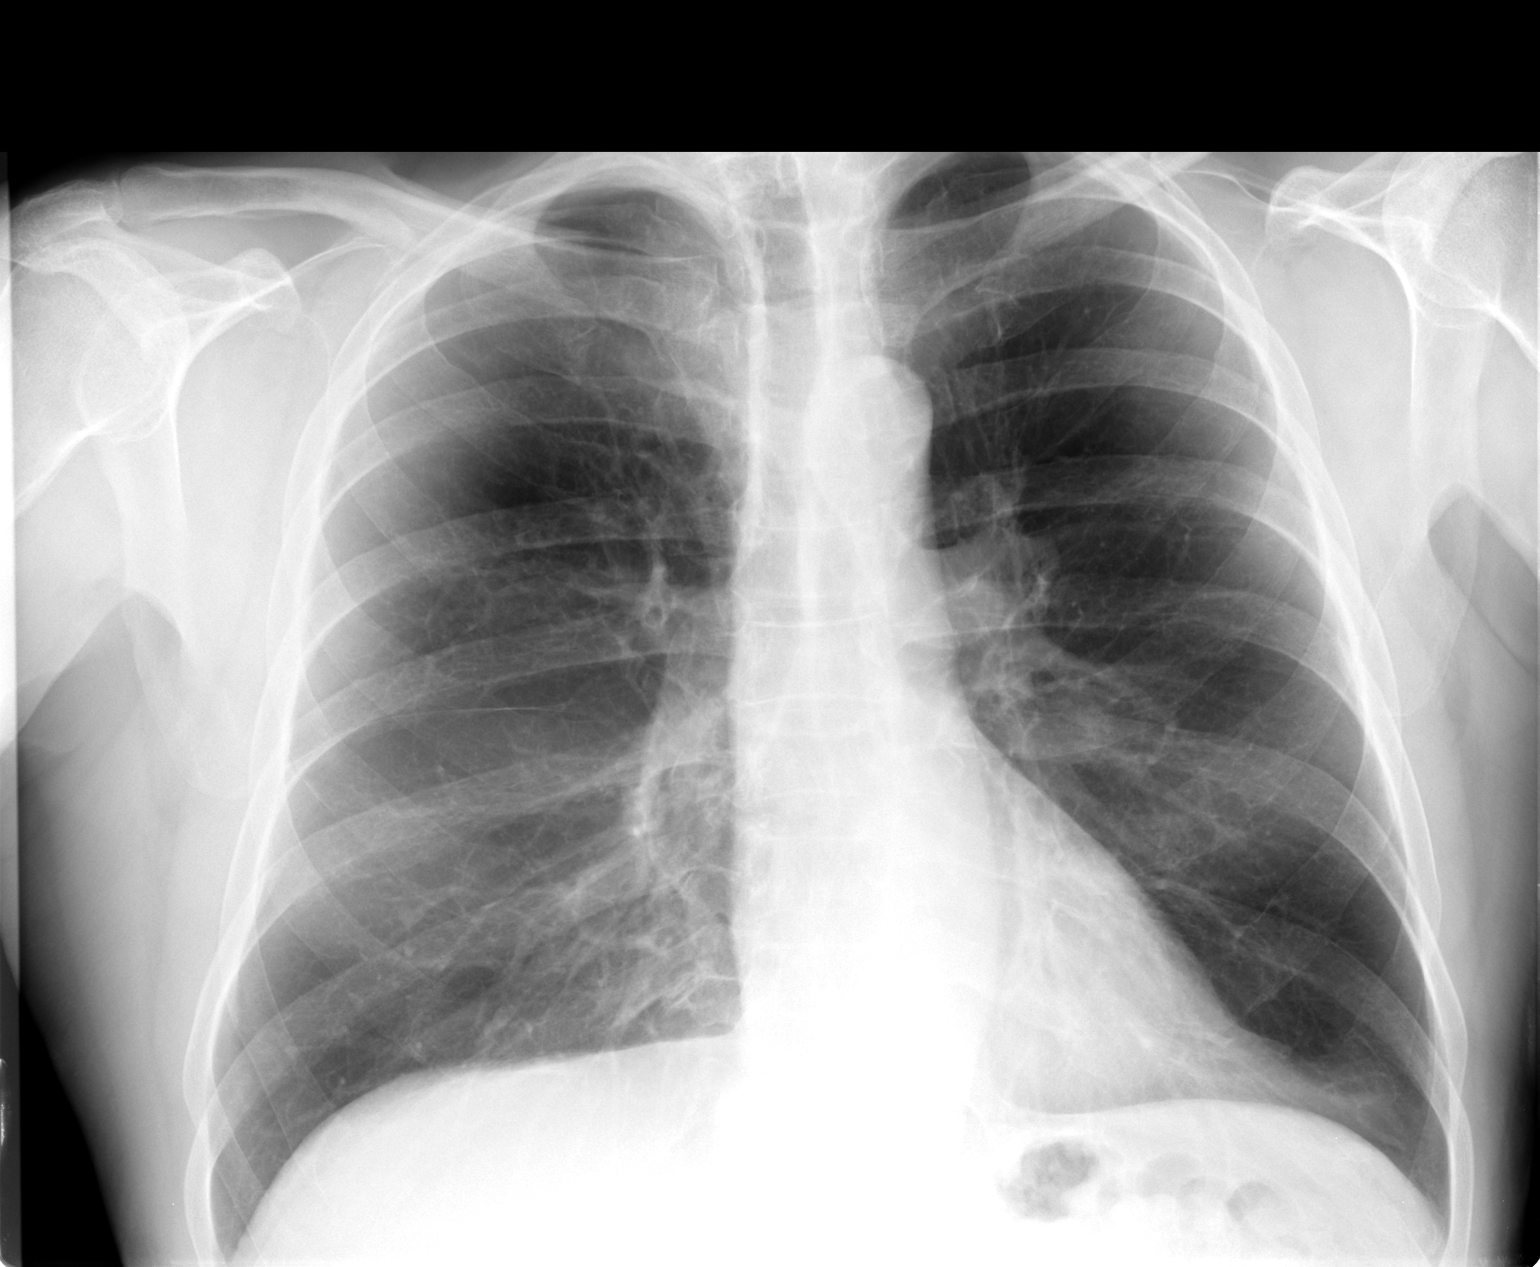

[view not recorded (2 of 2)]
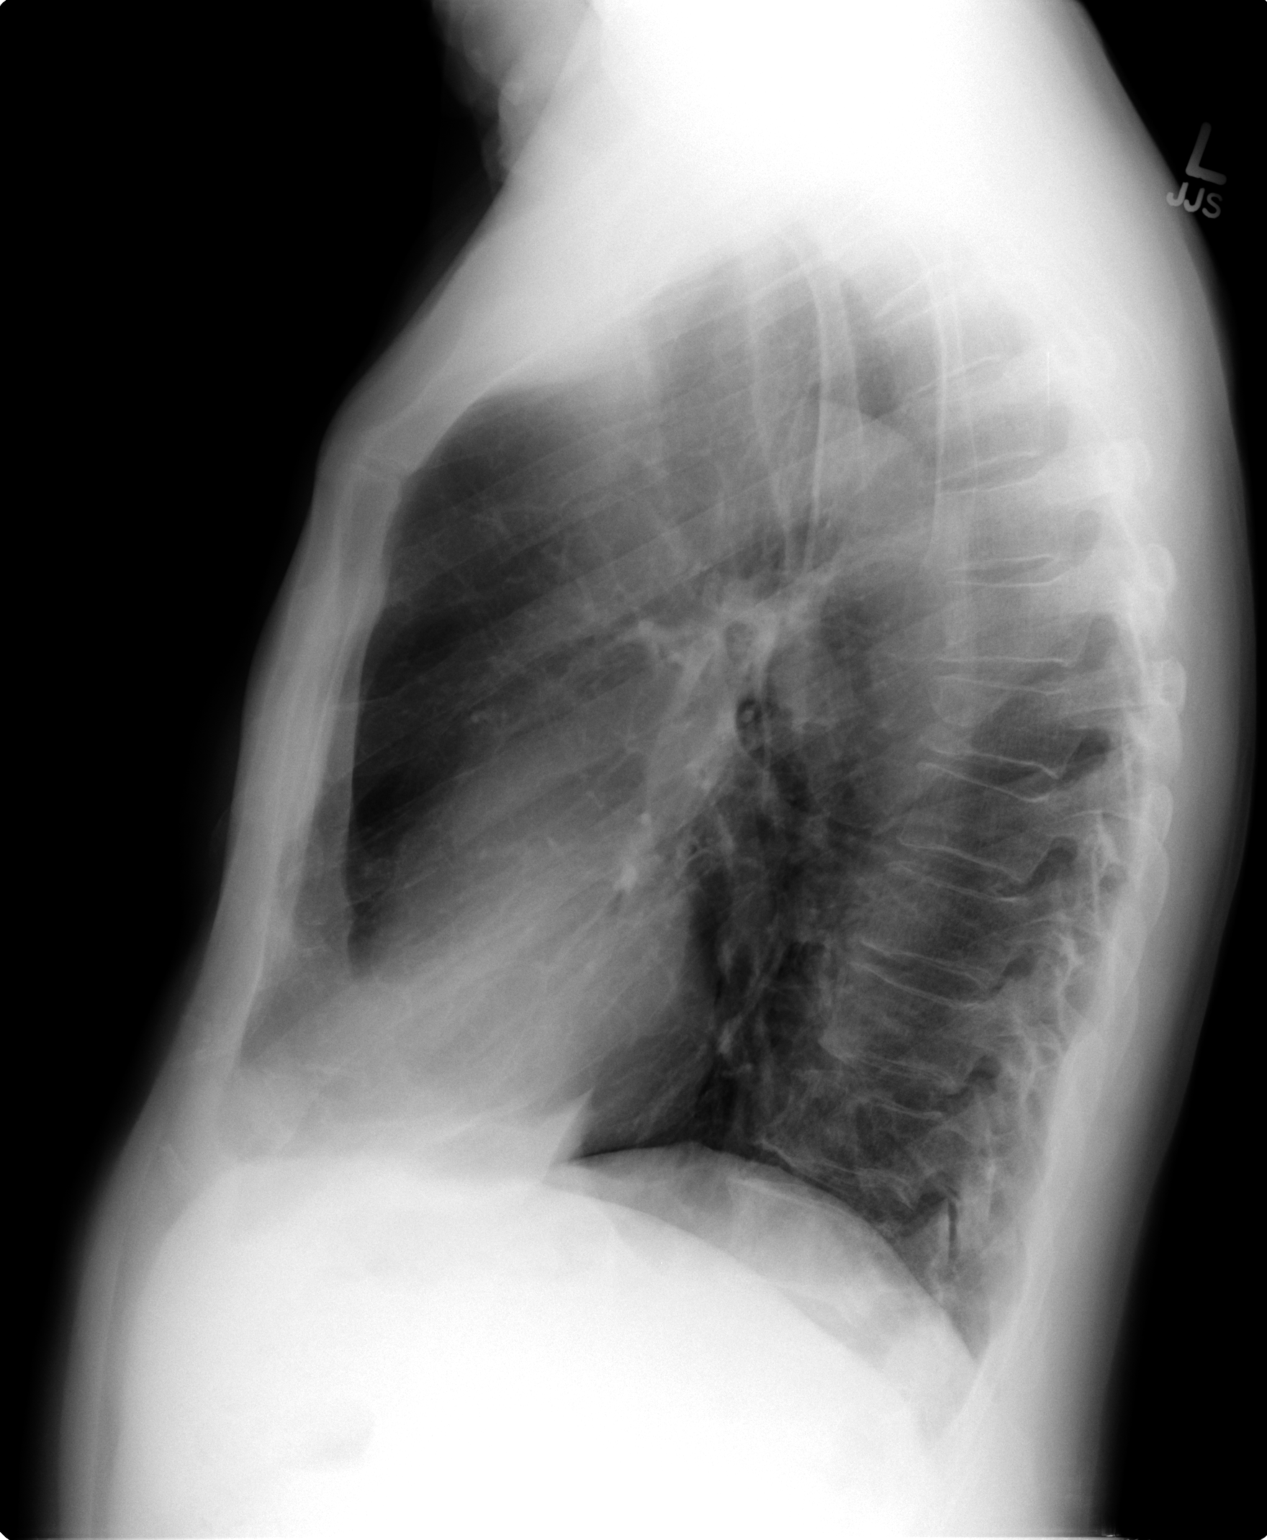

[2 of 2 positions shown; findings below may reference images not displayed]

FINDINGS: Lungs are clear.  Heart size and configuration remain
normal.  Mediastinum, hila, pleura and osseous structures remain
normal for age.
IMPRESSION: Stable.  No active cardiopulmonary disease.

## 2012-01-05 ENCOUNTER — Other Ambulatory Visit: Payer: Self-pay | Admitting: Dermatology

## 2012-05-10 ENCOUNTER — Ambulatory Visit (HOSPITAL_BASED_OUTPATIENT_CLINIC_OR_DEPARTMENT_OTHER): Payer: Medicare Other | Admitting: Hematology & Oncology

## 2012-05-10 ENCOUNTER — Other Ambulatory Visit (HOSPITAL_BASED_OUTPATIENT_CLINIC_OR_DEPARTMENT_OTHER): Payer: Medicare Other | Admitting: Lab

## 2012-05-10 VITALS — BP 121/83 | HR 59 | Temp 97.8°F | Resp 18 | Ht 68.0 in | Wt 146.0 lb

## 2012-05-10 DIAGNOSIS — Z85048 Personal history of other malignant neoplasm of rectum, rectosigmoid junction, and anus: Secondary | ICD-10-CM

## 2012-05-10 DIAGNOSIS — E785 Hyperlipidemia, unspecified: Secondary | ICD-10-CM

## 2012-05-10 DIAGNOSIS — C20 Malignant neoplasm of rectum: Secondary | ICD-10-CM

## 2012-05-10 LAB — COMPREHENSIVE METABOLIC PANEL
Albumin: 4.3 g/dL (ref 3.5–5.2)
BUN: 12 mg/dL (ref 6–23)
CO2: 29 mEq/L (ref 19–32)
Calcium: 9.6 mg/dL (ref 8.4–10.5)
Chloride: 103 mEq/L (ref 96–112)
Creatinine, Ser: 0.89 mg/dL (ref 0.50–1.35)
Glucose, Bld: 98 mg/dL (ref 70–99)
Potassium: 4.5 mEq/L (ref 3.5–5.3)

## 2012-05-10 LAB — CBC WITH DIFFERENTIAL (CANCER CENTER ONLY)
BASO#: 0.1 10*3/uL (ref 0.0–0.2)
HCT: 43.4 % (ref 38.7–49.9)
HGB: 14.8 g/dL (ref 13.0–17.1)
LYMPH#: 1.2 10*3/uL (ref 0.9–3.3)
LYMPH%: 23.5 % (ref 14.0–48.0)
MCHC: 34.1 g/dL (ref 32.0–35.9)
MCV: 86 fL (ref 82–98)
MONO#: 0.5 10*3/uL (ref 0.1–0.9)
NEUT%: 56.1 % (ref 40.0–80.0)
RDW: 12.4 % (ref 11.1–15.7)
WBC: 5.1 10*3/uL (ref 4.0–10.0)

## 2012-05-10 NOTE — Progress Notes (Signed)
This office note has been dictated.

## 2012-05-11 ENCOUNTER — Encounter: Payer: Self-pay | Admitting: *Deleted

## 2012-05-11 ENCOUNTER — Telehealth: Payer: Self-pay | Admitting: *Deleted

## 2012-05-11 NOTE — Progress Notes (Signed)
Wrong encounter opened.

## 2012-05-11 NOTE — Progress Notes (Signed)
CC:   [name], M.D.  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  CURRENT THERAPY:  Observation.  INTERIM HISTORY:  [name] comes in for his yearly followup. Actually, it was a little bit of a tough year for him.  His wife passed away back in 09-20-10.  He is still having a hard time with this.  He is still doing a lot of work around his property.  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  He enjoys doing this.  He is still having some issues with his right lower eyelid.  This is "turned out."  He had surgery for this.  Apparently, surgery has not helped that much.  He is not anterior anymore surgery.  He has had no fevers, sweats, or chills.  He has had no change in bowel or bladder habits.  He has an occasional bout of diverticulitis.  His last CEA level was less than 0.5 back in January 2013.  PHYSICAL EXAMINATION:  General:  This is an elderly, thin white gentleman in no obvious distress.  Vital signs:  Temperature 97.8, pulse 59, respiratory rate 18, blood pressure 121/83.  Weight is 146.  Head and neck:  Normocephalic, atraumatic skull.  There are no ocular or oral lesions.  He does have the lower eyelid on the right eye which is erythematous and everted.  He has no adenopathy in the neck.  Lungs: Clear bilaterally.  Cardiac:  Regular rate and rhythm with a normal S1 and S2.  There are no murmurs, rubs, or bruits.  Abdomen:  Soft with good bowel sounds.  There is no palpable abdominal mass.  There is no fluid wave.  There is no palpable hepatosplenomegaly.  Back:  No tenderness over the spine, ribs, or hips.  Extremities:  No clubbing, cyanosis or edema.  Skin:  Does show actinic keratoses.  He has some solar keratoses.  I do not see anything that appears to be suspicious.  LABORATORY STUDIES:  White cell count is 5.1, hemoglobin 14.8, hematocrit 43.4, platelet count 158.  IMPRESSION:  [name] is a 79 year old gentleman with a remote history of  stage II adenocarcinoma of the rectum.  He is now about I think __________ out from treatment.  He still likes to come back to see [place] every year.  We will plan to get him back in 1 more year.    ______________________________ Josph Macho, M.D. PRE/MEDQ  D:  05/10/2012  T:  05/11/2012  Job:  [id number]

## 2012-05-11 NOTE — Telephone Encounter (Signed)
Message copied by Anselm Jungling on Fri May 11, 2012 10:53 AM ------      Message from: Arlan Organ R      Created: Fri May 11, 2012  7:43 AM       Please call and tell him that his labs are fine. Please mail his labs to him. Thanks. Cindee Lame

## 2012-05-11 NOTE — Telephone Encounter (Signed)
Called patient to let him know that his labwork was all fine per dr. Myna Hidalgo. Labs mailed to patient as requested

## 2013-01-08 ENCOUNTER — Other Ambulatory Visit: Payer: Self-pay | Admitting: Dermatology

## 2013-05-09 ENCOUNTER — Ambulatory Visit (HOSPITAL_BASED_OUTPATIENT_CLINIC_OR_DEPARTMENT_OTHER): Payer: Medicare Other | Admitting: Hematology & Oncology

## 2013-05-09 ENCOUNTER — Other Ambulatory Visit (HOSPITAL_BASED_OUTPATIENT_CLINIC_OR_DEPARTMENT_OTHER): Payer: Medicare Other | Admitting: Lab

## 2013-05-09 VITALS — BP 136/69 | HR 57 | Temp 97.3°F | Resp 18 | Ht 68.0 in | Wt 146.0 lb

## 2013-05-09 DIAGNOSIS — E785 Hyperlipidemia, unspecified: Secondary | ICD-10-CM

## 2013-05-09 DIAGNOSIS — C20 Malignant neoplasm of rectum: Secondary | ICD-10-CM

## 2013-05-09 DIAGNOSIS — Z85048 Personal history of other malignant neoplasm of rectum, rectosigmoid junction, and anus: Secondary | ICD-10-CM

## 2013-05-09 LAB — COMPREHENSIVE METABOLIC PANEL
ALBUMIN: 4.2 g/dL (ref 3.5–5.2)
ALT: 31 U/L (ref 0–53)
AST: 32 U/L (ref 0–37)
Alkaline Phosphatase: 67 U/L (ref 39–117)
BUN: 16 mg/dL (ref 6–23)
CALCIUM: 9.8 mg/dL (ref 8.4–10.5)
CHLORIDE: 101 meq/L (ref 96–112)
CO2: 28 meq/L (ref 19–32)
Creatinine, Ser: 0.8 mg/dL (ref 0.50–1.35)
GLUCOSE: 93 mg/dL (ref 70–99)
POTASSIUM: 4.2 meq/L (ref 3.5–5.3)
Sodium: 140 mEq/L (ref 135–145)
Total Bilirubin: 0.5 mg/dL (ref 0.3–1.2)
Total Protein: 7.5 g/dL (ref 6.0–8.3)

## 2013-05-09 LAB — CBC WITH DIFFERENTIAL (CANCER CENTER ONLY)
BASO#: 0 10*3/uL (ref 0.0–0.2)
BASO%: 0.6 % (ref 0.0–2.0)
EOS ABS: 0.5 10*3/uL (ref 0.0–0.5)
EOS%: 9.6 % — ABNORMAL HIGH (ref 0.0–7.0)
HEMATOCRIT: 43.2 % (ref 38.7–49.9)
HGB: 14.8 g/dL (ref 13.0–17.1)
LYMPH#: 1.2 10*3/uL (ref 0.9–3.3)
LYMPH%: 21.9 % (ref 14.0–48.0)
MCH: 29.9 pg (ref 28.0–33.4)
MCHC: 34.3 g/dL (ref 32.0–35.9)
MCV: 87 fL (ref 82–98)
MONO#: 0.6 10*3/uL (ref 0.1–0.9)
MONO%: 10.3 % (ref 0.0–13.0)
NEUT#: 3.1 10*3/uL (ref 1.5–6.5)
NEUT%: 57.6 % (ref 40.0–80.0)
Platelets: 183 10*3/uL (ref 145–400)
RBC: 4.95 10*6/uL (ref 4.20–5.70)
RDW: 12.3 % (ref 11.1–15.7)
WBC: 5.4 10*3/uL (ref 4.0–10.0)

## 2013-05-09 LAB — LIPID PANEL
CHOLESTEROL: 187 mg/dL (ref 0–200)
HDL: 36 mg/dL — AB (ref >39)
LDL Cholesterol: 128 mg/dL — ABNORMAL HIGH (ref 0–99)
TRIGLYCERIDES: 115 mg/dL (ref <150)
Total CHOL/HDL Ratio: 5.2 Ratio
VLDL: 23 mg/dL (ref 0–40)

## 2013-05-09 LAB — CHCC SATELLITE - SMEAR

## 2013-05-09 LAB — CEA: CEA: <0.5 ng/mL (ref 0.0–5.0)

## 2013-05-09 NOTE — Progress Notes (Signed)
This office note has been dictated.

## 2013-05-13 NOTE — Progress Notes (Signed)
CC:   [name], M.D.  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  CURRENT THERAPY:  Observation.  INTERIM HISTORY:  [name] comes in for followup.  We see him every year.  He is doing quite well.  He has been __________ 15 years since he completed treatment.  He has had no problems over the last year.  There has been no change in medications.  He still has not had his __________ taken care of.  He has had no problems with cough or shortness of breath.  He does have a little of asthma and takes Symbicort for this.  He has had no problem with bowels or bladder.  He has had no leg swelling.  He has had no rashes.  He has had no fever, sweats, or chills.  So far, he has been immune of the flu.  When we last saw him, his CA was normal at less than 0.5.  He has had some skin lesions removed.  These have not been malignant. Squamous cell carcinoma is from extensive sun exposure.  PHYSICAL EXAMINATION:  This is an elderly white gentleman in no obvious distress.  Vital signs show temperature 97.3, pulse 57, respiratory rate 18, blood pressure 136/69, weight is 146 pounds.  Head and neck exam shows a normocephalic, atraumatic skull.  There are no ocular or oral lesions.  There are no palpable cervical or supraclavicular lymph nodes. Lungs are clear bilaterally.  Cardiac:  Regular rate and rhythm with a normal S1, S2.  There are no murmurs, rubs, or bruits.  Abdomen is soft. He has good bowel sounds.  There is no fluid wave.  There is no palpable hepatosplenomegaly.  He has well-healed laparotomy scar.  Extremities shows no clubbing, cyanosis, or edema.  Skin exam shows numerous actinic keratoses and likely some squamous cell carcinomas on the skin.  LABORATORY STUDIES:  White cell count is 5.4, hemoglobin 14.8, hematocrit 43.2, platelet count 183.  IMPRESSION:  [name] is an 80 year old gentleman with a past history of stage II adenocarcinoma of the  rectum.  He went ahead and received neoadjuvant chemo and radiation therapy.  He tolerated this very well. Again, he is 15 years out from treatment.  He __________ from my point of view.  I think, [name] likes to come back yearly just for reassurance.  We will certainly continue to do this for him.    ______________________________ Volanda Napoleon, M.D. PRE/MEDQ  D:  05/09/2013  T:  05/10/2013  Job:  [id number]

## 2013-12-31 ENCOUNTER — Encounter: Payer: Self-pay | Admitting: Internal Medicine

## 2014-05-08 ENCOUNTER — Other Ambulatory Visit (HOSPITAL_BASED_OUTPATIENT_CLINIC_OR_DEPARTMENT_OTHER): Payer: Medicare Other | Admitting: Lab

## 2014-05-08 ENCOUNTER — Encounter: Payer: Self-pay | Admitting: Hematology & Oncology

## 2014-05-08 ENCOUNTER — Ambulatory Visit (HOSPITAL_BASED_OUTPATIENT_CLINIC_OR_DEPARTMENT_OTHER): Payer: Medicare Other | Admitting: Hematology & Oncology

## 2014-05-08 VITALS — BP 133/78 | HR 61 | Temp 97.6°F | Resp 18 | Ht 68.0 in | Wt 145.0 lb

## 2014-05-08 DIAGNOSIS — Z85048 Personal history of other malignant neoplasm of rectum, rectosigmoid junction, and anus: Secondary | ICD-10-CM

## 2014-05-08 DIAGNOSIS — C20 Malignant neoplasm of rectum: Secondary | ICD-10-CM

## 2014-05-08 DIAGNOSIS — M810 Age-related osteoporosis without current pathological fracture: Secondary | ICD-10-CM

## 2014-05-08 LAB — CBC WITH DIFFERENTIAL (CANCER CENTER ONLY)
BASO#: 0 10*3/uL (ref 0.0–0.2)
BASO%: 0.6 % (ref 0.0–2.0)
EOS ABS: 0.5 10*3/uL (ref 0.0–0.5)
EOS%: 9.5 % — AB (ref 0.0–7.0)
HCT: 43.6 % (ref 38.7–49.9)
HEMOGLOBIN: 15.1 g/dL (ref 13.0–17.1)
LYMPH#: 1.3 10*3/uL (ref 0.9–3.3)
LYMPH%: 24.4 % (ref 14.0–48.0)
MCH: 30.1 pg (ref 28.0–33.4)
MCHC: 34.6 g/dL (ref 32.0–35.9)
MCV: 87 fL (ref 82–98)
MONO#: 0.5 10*3/uL (ref 0.1–0.9)
MONO%: 8.9 % (ref 0.0–13.0)
NEUT#: 2.9 10*3/uL (ref 1.5–6.5)
NEUT%: 56.6 % (ref 40.0–80.0)
Platelets: 204 10*3/uL (ref 145–400)
RBC: 5.02 10*6/uL (ref 4.20–5.70)
RDW: 12.2 % (ref 11.1–15.7)
WBC: 5.2 10*3/uL (ref 4.0–10.0)

## 2014-05-08 LAB — COMPREHENSIVE METABOLIC PANEL
ALBUMIN: 4.2 g/dL (ref 3.5–5.2)
ALT: 24 U/L (ref 0–53)
AST: 18 U/L (ref 0–37)
Alkaline Phosphatase: 79 U/L (ref 39–117)
BUN: 19 mg/dL (ref 6–23)
CO2: 29 mEq/L (ref 19–32)
Calcium: 9.5 mg/dL (ref 8.4–10.5)
Chloride: 99 mEq/L (ref 96–112)
Creatinine, Ser: 0.84 mg/dL (ref 0.50–1.35)
GLUCOSE: 93 mg/dL (ref 70–99)
Potassium: 4 mEq/L (ref 3.5–5.3)
SODIUM: 137 meq/L (ref 135–145)
Total Bilirubin: 0.6 mg/dL (ref 0.2–1.2)
Total Protein: 7.3 g/dL (ref 6.0–8.3)

## 2014-05-08 LAB — CEA: CEA: 0.7 ng/mL (ref 0.0–5.0)

## 2014-05-08 NOTE — Progress Notes (Signed)
  Hematology and Oncology Follow Up Visit  [name] [id number] 1930/06/06 81 y.o. 05/08/2014   Principle Diagnosis:   Stage II (T3N0M0) rectal cancer  Current Therapy:    Observation     Interim History:  Mr.  [name] is back for follow-up. I see him yearly. He's doing quite well.  He now has some glaucoma. He's watching out for this.  He watches his diet. He saw some diarrhea. This has been chronic prompt for him.  He is has not had his rectal cancer now for 16 years.  He's had no cough. He's had no shortness of breath. He's had no fever.  Medications: Current outpatient prescriptions: budesonide-formoterol (SYMBICORT) 80-4.5 MCG/ACT inhaler, Inhale 2 puffs into the lungs as needed., Disp: , Rfl: ;  latanoprost (XALATAN) 0.005 % ophthalmic solution, Place 1 drop into both eyes at bedtime., Disp: , Rfl: ;  lisinopril-hydrochlorothiazide (PRINZIDE,ZESTORETIC) 20-25 MG per tablet, Take 1 tablet by mouth daily., Disp: , Rfl:   Allergies: No Known Allergies  Past Medical History, Surgical history, Social history, and Family History were reviewed and updated.  Review of Systems: As above  Physical Exam:  height is 5\' 8"  (1.727 m) and weight is 145 lb (65.772 kg). His oral temperature is 97.6 F (36.4 C). His blood pressure is 133/78 and his pulse is 61. His respiration is 18.   Well-developed and well-nourished white woman. Head and neck exam shows no ocular or oral lesions. He has no palpable cervical or supraclavicular lymph nodes. Lungs are clear. Cardiac exam regular rate and rhythm with no murmurs, rubs or bruits. Abdomen is soft. Has good bowel sounds. There is no fluid wave. He has no palpable liver or spleen tip. Back exam shows no tenderness over the spine, ribs or hips. Extremities shows no clubbing, cyanosis or edema. He has some age related osteoarthritic changes. Skin exam shows some areas of actinic keratoses. He has some areas of likely basal cell or squamous cell  carcinomas. This is chronic. Neurological exam is nonfocal.  Lab Results  Component Value Date   WBC 5.2 05/08/2014   HGB 15.1 05/08/2014   HCT 43.6 05/08/2014   MCV 87 05/08/2014   PLT 204 05/08/2014     Chemistry      Component Value Date/Time   NA 140 05/09/2013 0805   K 4.2 05/09/2013 0805   CL 101 05/09/2013 0805   CO2 28 05/09/2013 0805   BUN 16 05/09/2013 0805   CREATININE 0.80 05/09/2013 0805      Component Value Date/Time   CALCIUM 9.8 05/09/2013 0805   ALKPHOS 67 05/09/2013 0805   AST 32 05/09/2013 0805   ALT 31 05/09/2013 0805   BILITOT 0.5 05/09/2013 0805         Impression and Plan: [name] is 81 year old gentleman with a remote history of rectal cancer. He has stage II disease. He had neoadjuvant chemotherapy and radiation therapy.  He is clearly cured of this. However, her lites come back to see [place] yearly. He just gets "peace of mind" with, to see [place].  I don't see any problems with him having any surgical procedures from my point of view.  We will see him back in one year.   Volanda Napoleon, MD 1/7/20168:36 AM

## 2014-05-12 ENCOUNTER — Telehealth: Payer: Self-pay | Admitting: *Deleted

## 2014-05-12 NOTE — Telephone Encounter (Signed)
-----   Message from Volanda Napoleon, MD sent at 05/08/2014  5:55 PM EST ----- Call - labs look ok!! Laurey Arrow

## 2015-05-06 ENCOUNTER — Other Ambulatory Visit (HOSPITAL_BASED_OUTPATIENT_CLINIC_OR_DEPARTMENT_OTHER): Payer: Medicare Other

## 2015-05-06 ENCOUNTER — Encounter: Payer: Self-pay | Admitting: Hematology & Oncology

## 2015-05-06 ENCOUNTER — Ambulatory Visit (HOSPITAL_BASED_OUTPATIENT_CLINIC_OR_DEPARTMENT_OTHER): Payer: Medicare Other | Admitting: Hematology & Oncology

## 2015-05-06 VITALS — BP 122/75 | HR 59 | Temp 97.9°F | Resp 14 | Ht 68.0 in | Wt 145.0 lb

## 2015-05-06 DIAGNOSIS — C20 Malignant neoplasm of rectum: Secondary | ICD-10-CM

## 2015-05-06 DIAGNOSIS — Z85048 Personal history of other malignant neoplasm of rectum, rectosigmoid junction, and anus: Secondary | ICD-10-CM

## 2015-05-06 DIAGNOSIS — E785 Hyperlipidemia, unspecified: Secondary | ICD-10-CM

## 2015-05-06 DIAGNOSIS — E782 Mixed hyperlipidemia: Secondary | ICD-10-CM | POA: Diagnosis not present

## 2015-05-06 DIAGNOSIS — M810 Age-related osteoporosis without current pathological fracture: Secondary | ICD-10-CM

## 2015-05-06 HISTORY — DX: Mixed hyperlipidemia: E78.2

## 2015-05-06 LAB — LIPID PANEL
CHOLESTEROL: 181 mg/dL (ref 125–200)
HDL: 44 mg/dL (ref >=40)
LDL CALC: 115 mg/dL (ref <130)
TRIGLYCERIDES: 111 mg/dL (ref <150)
Total CHOL/HDL Ratio: 4.1 Ratio (ref <=5.0)
VLDL: 22 mg/dL (ref <30)

## 2015-05-06 LAB — COMPREHENSIVE METABOLIC PANEL
ALT: 25 U/L (ref 0–55)
ANION GAP: 8 meq/L (ref 3–11)
AST: 21 U/L (ref 5–34)
Albumin: 3.8 g/dL (ref 3.5–5.0)
Alkaline Phosphatase: 71 U/L (ref 40–150)
BUN: 15.6 mg/dL (ref 7.0–26.0)
CALCIUM: 9.6 mg/dL (ref 8.4–10.4)
CHLORIDE: 106 meq/L (ref 98–109)
CO2: 25 meq/L (ref 22–29)
CREATININE: 0.9 mg/dL (ref 0.7–1.3)
EGFR: 81 mL/min/{1.73_m2} — AB (ref >90)
Glucose: 94 mg/dl (ref 70–140)
POTASSIUM: 4.2 meq/L (ref 3.5–5.1)
Sodium: 140 mEq/L (ref 136–145)
Total Bilirubin: 0.75 mg/dL (ref 0.20–1.20)
Total Protein: 7.5 g/dL (ref 6.4–8.3)

## 2015-05-06 LAB — CBC WITH DIFFERENTIAL (CANCER CENTER ONLY)
BASO#: 0 10*3/uL (ref 0.0–0.2)
BASO%: 0.5 % (ref 0.0–2.0)
EOS ABS: 0.4 10*3/uL (ref 0.0–0.5)
EOS%: 7 % (ref 0.0–7.0)
HCT: 44.2 % (ref 38.7–49.9)
HGB: 15 g/dL (ref 13.0–17.1)
LYMPH#: 1.3 10*3/uL (ref 0.9–3.3)
LYMPH%: 21.1 % (ref 14.0–48.0)
MCH: 29.9 pg (ref 28.0–33.4)
MCHC: 33.9 g/dL (ref 32.0–35.9)
MCV: 88 fL (ref 82–98)
MONO#: 0.6 10*3/uL (ref 0.1–0.9)
MONO%: 10.1 % (ref 0.0–13.0)
NEUT#: 3.7 10*3/uL (ref 1.5–6.5)
NEUT%: 61.3 % (ref 40.0–80.0)
PLATELETS: 174 10*3/uL (ref 145–400)
RBC: 5.01 10*6/uL (ref 4.20–5.70)
RDW: 12.5 % (ref 11.1–15.7)
WBC: 6 10*3/uL (ref 4.0–10.0)

## 2015-05-06 NOTE — Progress Notes (Signed)
Hematology and Oncology Follow Up Visit  [name] [id number] [birth year] 82 y.o. 05/06/2015   Principle Diagnosis:   Stage II (T3N0M0) rectal cancer  Current Therapy:    Observation     Interim History:  [name] is back for follow-up. I see him yearly. He's doing quite well.  He is doing okay. Unfortunately, at the price of [name] is going down so it makes it over for him to make a living.  He needs cataract surgery. I told him that this would not be a problem from my point of view.  He's had no problems with cough or shortness of breath. He's had no change in bowel bladder habits.  He's had no leg swelling. He's had no rashes.  Overall, his performance status is ECOG 0.  Medications:  Current outpatient prescriptions:  .  budesonide-formoterol (SYMBICORT) 80-4.5 MCG/ACT inhaler, Inhale 2 puffs into the lungs as needed., Disp: , Rfl:  .  latanoprost (XALATAN) 0.005 % ophthalmic solution, Place 1 drop into both eyes at bedtime., Disp: , Rfl:  .  lisinopril-hydrochlorothiazide (PRINZIDE,ZESTORETIC) 20-25 MG per tablet, Take 1 tablet by mouth daily., Disp: , Rfl:   Allergies: No Known Allergies  Past Medical History, Surgical history, Social history, and Family History were reviewed and updated.  Review of Systems: As above  Physical Exam:  height is 5\' 8"  (1.727 m) and weight is 145 lb (65.772 kg). His oral temperature is 97.9 F (36.6 C). His blood pressure is 122/75 and his pulse is 59. His respiration is 14.   Well-developed and well-nourished white woman. Head and neck exam shows no ocular or oral lesions. He has no palpable cervical or supraclavicular lymph nodes. Lungs are clear. Cardiac exam regular rate and rhythm with no murmurs, rubs or bruits. Abdomen is soft. Has good bowel sounds. There is no fluid wave. He has no palpable liver or spleen tip. Back exam shows no tenderness over the spine, ribs or hips. Extremities shows no clubbing, cyanosis or  edema. He has some age related osteoarthritic changes. Skin exam shows some areas of actinic keratoses. He has some areas of likely basal cell or squamous cell carcinomas. This is chronic. Neurological exam is nonfocal.  Lab Results  Component Value Date   WBC 6.0 05/06/2015   HGB 15.0 05/06/2015   HCT 44.2 05/06/2015   MCV 88 05/06/2015   PLT 174 05/06/2015     Chemistry      Component Value Date/Time   NA 137 05/08/2014 0755   K 4.0 05/08/2014 0755   CL 99 05/08/2014 0755   CO2 29 05/08/2014 0755   BUN 19 05/08/2014 0755   CREATININE 0.84 05/08/2014 0755      Component Value Date/Time   CALCIUM 9.5 05/08/2014 0755   ALKPHOS 79 05/08/2014 0755   AST 18 05/08/2014 0755   ALT 24 05/08/2014 0755   BILITOT 0.6 05/08/2014 0755         Impression and Plan: [name] is 82 year old gentleman with a remote history of rectal cancer. He has stage II disease. He had neoadjuvant chemotherapy and radiation therapy.  He is clearly cured of this. However, her likes to come back to see [place] yearly. He just gets "peace of mind" with, to see [place].  We will go ahead and order the lipid panel for him. I will make sure that this goes to his family doctor.  If he needs cataract surgery, I do not see any problems with him having this  from my point of view.  We will see him back in one year.   Volanda Napoleon, MD 1/4/20179:45 AM

## 2015-05-07 ENCOUNTER — Other Ambulatory Visit: Payer: Medicare Other

## 2015-05-07 ENCOUNTER — Ambulatory Visit: Payer: Medicare Other | Admitting: Hematology & Oncology

## 2015-05-07 LAB — CEA: CEA: <0.5 ng/mL (ref 0.0–5.0)

## 2015-05-07 LAB — VITAMIN D 25 HYDROXY (VIT D DEFICIENCY, FRACTURES): Vit D, 25-Hydroxy: 22 ng/mL — ABNORMAL LOW (ref 30–100)

## 2015-05-11 ENCOUNTER — Telehealth: Payer: Self-pay

## 2015-05-11 NOTE — Telephone Encounter (Signed)
-----   Message from Volanda Napoleon, MD sent at 05/07/2015  7:48 PM EST ----- Call - vit D is very low.  You must be taking 2000units a day of vit D.  pete

## 2015-05-12 ENCOUNTER — Telehealth: Payer: Self-pay | Admitting: *Deleted

## 2015-05-12 NOTE — Telephone Encounter (Signed)
Instructed patient to start taking Vitamin D 2000 units daily.   Per patient's request; a copy of recent labs mailed to his home.   Patient verbalized understanding.

## 2015-05-12 NOTE — Telephone Encounter (Addendum)
 -----   Message from Volanda Napoleon, MD sent at 05/07/2015  7:48 PM EST ----- Call - vit D is very low.  You must be taking 2000units a day of vit D.  pete

## 2016-05-05 ENCOUNTER — Encounter: Payer: Self-pay | Admitting: Hematology & Oncology

## 2016-05-05 ENCOUNTER — Other Ambulatory Visit (HOSPITAL_BASED_OUTPATIENT_CLINIC_OR_DEPARTMENT_OTHER): Payer: Medicare HMO

## 2016-05-05 ENCOUNTER — Ambulatory Visit (HOSPITAL_BASED_OUTPATIENT_CLINIC_OR_DEPARTMENT_OTHER): Payer: Medicare HMO | Admitting: Hematology & Oncology

## 2016-05-05 VITALS — BP 147/83 | HR 65 | Temp 97.4°F | Wt 144.0 lb

## 2016-05-05 DIAGNOSIS — Z85048 Personal history of other malignant neoplasm of rectum, rectosigmoid junction, and anus: Secondary | ICD-10-CM

## 2016-05-05 DIAGNOSIS — E785 Hyperlipidemia, unspecified: Secondary | ICD-10-CM | POA: Diagnosis not present

## 2016-05-05 DIAGNOSIS — C20 Malignant neoplasm of rectum: Secondary | ICD-10-CM

## 2016-05-05 DIAGNOSIS — E782 Mixed hyperlipidemia: Secondary | ICD-10-CM | POA: Diagnosis not present

## 2016-05-05 LAB — CBC WITH DIFFERENTIAL (CANCER CENTER ONLY)
BASO#: 0 10*3/uL (ref 0.0–0.2)
BASO%: 0.7 % (ref 0.0–2.0)
EOS%: 10.2 % — AB (ref 0.0–7.0)
Eosinophils Absolute: 0.6 10*3/uL — ABNORMAL HIGH (ref 0.0–0.5)
HCT: 44.4 % (ref 38.7–49.9)
HEMOGLOBIN: 15.7 g/dL (ref 13.0–17.1)
LYMPH#: 1.2 10*3/uL (ref 0.9–3.3)
LYMPH%: 21.3 % (ref 14.0–48.0)
MCH: 31.3 pg (ref 28.0–33.4)
MCHC: 35.4 g/dL (ref 32.0–35.9)
MCV: 88 fL (ref 82–98)
MONO#: 0.5 10*3/uL (ref 0.1–0.9)
MONO%: 8.5 % (ref 0.0–13.0)
NEUT%: 59.3 % (ref 40.0–80.0)
NEUTROS ABS: 3.4 10*3/uL (ref 1.5–6.5)
PLATELETS: 195 10*3/uL (ref 145–400)
RBC: 5.02 10*6/uL (ref 4.20–5.70)
RDW: 12 % (ref 11.1–15.7)
WBC: 5.8 10*3/uL (ref 4.0–10.0)

## 2016-05-05 LAB — COMPREHENSIVE METABOLIC PANEL
ALT: 46 U/L (ref 0–55)
AST: 30 U/L (ref 5–34)
Albumin: 4.2 g/dL (ref 3.5–5.0)
Alkaline Phosphatase: 79 U/L (ref 40–150)
Anion Gap: 11 mEq/L (ref 3–11)
BUN: 20.5 mg/dL (ref 7.0–26.0)
CO2: 27 mEq/L (ref 22–29)
Calcium: 10.2 mg/dL (ref 8.4–10.4)
Chloride: 103 mEq/L (ref 98–109)
Creatinine: 1 mg/dL (ref 0.7–1.3)
EGFR: 71 mL/min/{1.73_m2} — AB (ref >90)
GLUCOSE: 95 mg/dL (ref 70–140)
Potassium: 4.1 mEq/L (ref 3.5–5.1)
SODIUM: 140 meq/L (ref 136–145)
Total Bilirubin: 0.74 mg/dL (ref 0.20–1.20)
Total Protein: 7.7 g/dL (ref 6.4–8.3)

## 2016-05-05 LAB — CEA (IN HOUSE-CHCC): CEA (CHCC-In House): 1.27 ng/mL (ref 0.00–5.00)

## 2016-05-05 NOTE — Progress Notes (Signed)
Hematology and Oncology Follow Up Visit  [name] [id number] 23-Jul-1930 83 y.o. 05/05/2016   Principle Diagnosis:   Stage II (T3N0M0) rectal cancer  Current Therapy:    Observation     Interim History:  [name] is back for follow-up. I see him yearly. He's doing quite well.  He is doing okay. He is still working. He really enjoys his work. He works on a farm. 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  He had Mohs surgery for a squamous cell carcinoma next to his left ear.  He had cataract surgery last year. He did well with this.  He's had no problems with cough or shortness of breath. He's had no change in bowel bladder habits.  He's had no leg swelling. He's had no rashes.  Overall, his performance status is ECOG 0.  Medications:  Current Outpatient Prescriptions:  .  budesonide-formoterol (SYMBICORT) 80-4.5 MCG/ACT inhaler, Inhale 2 puffs into the lungs as needed., Disp: , Rfl:  .  latanoprost (XALATAN) 0.005 % ophthalmic solution, Place 1 drop into both eyes at bedtime., Disp: , Rfl:  .  lisinopril-hydrochlorothiazide (PRINZIDE,ZESTORETIC) 20-25 MG per tablet, Take 1 tablet by mouth daily., Disp: , Rfl:   Allergies: No Known Allergies  Past Medical History, Surgical history, Social history, and Family History were reviewed and updated.  Review of Systems: As above  Physical Exam:  weight is 144 lb (65.3 kg). His oral temperature is 97.4 F (36.3 C). His blood pressure is 147/83 (abnormal) and his pulse is 65.   Well-developed and well-nourished white woman. Head and neck exam shows no ocular or oral lesions. He has no palpable cervical or supraclavicular lymph nodes. Lungs are clear. Cardiac exam regular rate and rhythm with no murmurs, rubs or bruits. Abdomen is soft. Has good bowel sounds. There is no fluid wave. He has no palpable liver or spleen tip. Back exam shows no tenderness over the spine,  ribs or hips. Extremities shows no clubbing, cyanosis or edema. He has some age related osteoarthritic changes. Skin exam shows some areas of actinic keratoses. He has some areas of likely basal cell or squamous cell carcinomas. This is chronic. Neurological exam is nonfocal.  Lab Results  Component Value Date   WBC 5.8 05/05/2016   HGB 15.7 05/05/2016   HCT 44.4 05/05/2016   MCV 88 05/05/2016   PLT 195 05/05/2016     Chemistry      Component Value Date/Time   NA 140 05/06/2015 0840   K 4.2 05/06/2015 0840   CL 99 05/08/2014 0755   CO2 25 05/06/2015 0840   BUN 15.6 05/06/2015 0840   CREATININE 0.9 05/06/2015 0840      Component Value Date/Time   CALCIUM 9.6 05/06/2015 0840   ALKPHOS 71 05/06/2015 0840   AST 21 05/06/2015 0840   ALT 25 05/06/2015 0840   BILITOT 0.75 05/06/2015 0840         Impression and Plan: [name] is 83 year old gentleman with a remote history of rectal cancer. He has stage II disease. He had neoadjuvant chemotherapy and radiation therapy.  He is clearly cured of this. However, he likes to come back to see [place] yearly. He just gets "peace of mind" with, to see [place].  His vitamin D level was low last year. I told take 2000 units daily of vitamin D. I'm not sure if he is doing this.  We will  see him back in one year. Again, he likes to come back to see [place] so that he can get a good physical exam.  Volanda Napoleon, MD 1/4/20188:29 AM

## 2016-05-06 LAB — CEA (PARALLEL TESTING): CEA: <0.5 ng/mL

## 2016-05-23 DIAGNOSIS — R69 Illness, unspecified: Secondary | ICD-10-CM | POA: Diagnosis not present

## 2016-08-15 DIAGNOSIS — H02831 Dermatochalasis of right upper eyelid: Secondary | ICD-10-CM | POA: Diagnosis not present

## 2016-08-15 DIAGNOSIS — H02834 Dermatochalasis of left upper eyelid: Secondary | ICD-10-CM | POA: Diagnosis not present

## 2016-08-15 DIAGNOSIS — H02112 Cicatricial ectropion of right lower eyelid: Secondary | ICD-10-CM | POA: Diagnosis not present

## 2016-08-15 DIAGNOSIS — H401131 Primary open-angle glaucoma, bilateral, mild stage: Secondary | ICD-10-CM | POA: Diagnosis not present

## 2016-12-05 DIAGNOSIS — R69 Illness, unspecified: Secondary | ICD-10-CM | POA: Diagnosis not present

## 2017-02-09 DIAGNOSIS — R69 Illness, unspecified: Secondary | ICD-10-CM | POA: Diagnosis not present

## 2017-02-13 DIAGNOSIS — H25013 Cortical age-related cataract, bilateral: Secondary | ICD-10-CM | POA: Diagnosis not present

## 2017-02-13 DIAGNOSIS — H401131 Primary open-angle glaucoma, bilateral, mild stage: Secondary | ICD-10-CM | POA: Diagnosis not present

## 2017-02-13 DIAGNOSIS — H2513 Age-related nuclear cataract, bilateral: Secondary | ICD-10-CM | POA: Diagnosis not present

## 2017-05-04 ENCOUNTER — Other Ambulatory Visit: Payer: Self-pay

## 2017-05-04 ENCOUNTER — Other Ambulatory Visit: Payer: Medicare HMO

## 2017-05-04 ENCOUNTER — Ambulatory Visit (HOSPITAL_BASED_OUTPATIENT_CLINIC_OR_DEPARTMENT_OTHER): Payer: Medicare HMO | Admitting: Hematology & Oncology

## 2017-05-04 VITALS — BP 149/71 | HR 58 | Temp 97.5°F | Resp 18 | Wt 139.5 lb

## 2017-05-04 DIAGNOSIS — E782 Mixed hyperlipidemia: Secondary | ICD-10-CM | POA: Diagnosis not present

## 2017-05-04 DIAGNOSIS — Z85048 Personal history of other malignant neoplasm of rectum, rectosigmoid junction, and anus: Secondary | ICD-10-CM | POA: Diagnosis not present

## 2017-05-04 DIAGNOSIS — H538 Other visual disturbances: Secondary | ICD-10-CM | POA: Diagnosis not present

## 2017-05-04 DIAGNOSIS — C20 Malignant neoplasm of rectum: Secondary | ICD-10-CM

## 2017-05-04 DIAGNOSIS — B0229 Other postherpetic nervous system involvement: Secondary | ICD-10-CM

## 2017-05-04 LAB — CBC WITH DIFFERENTIAL (CANCER CENTER ONLY)
BASO#: 0 10*3/uL (ref 0.0–0.2)
BASO%: 0.6 % (ref 0.0–2.0)
EOS%: 9.8 % — AB (ref 0.0–7.0)
Eosinophils Absolute: 0.5 10*3/uL (ref 0.0–0.5)
HEMATOCRIT: 45.1 % (ref 38.7–49.9)
HEMOGLOBIN: 15.9 g/dL (ref 13.0–17.1)
LYMPH#: 1.3 10*3/uL (ref 0.9–3.3)
LYMPH%: 24.2 % (ref 14.0–48.0)
MCH: 31.4 pg (ref 28.0–33.4)
MCHC: 35.3 g/dL (ref 32.0–35.9)
MCV: 89 fL (ref 82–98)
MONO#: 0.6 10*3/uL (ref 0.1–0.9)
MONO%: 11 % (ref 0.0–13.0)
NEUT%: 54.4 % (ref 40.0–80.0)
NEUTROS ABS: 2.8 10*3/uL (ref 1.5–6.5)
Platelets: 176 10*3/uL (ref 145–400)
RBC: 5.06 10*6/uL (ref 4.20–5.70)
RDW: 11.9 % (ref 11.1–15.7)
WBC: 5.2 10*3/uL (ref 4.0–10.0)

## 2017-05-04 LAB — CMP (CANCER CENTER ONLY)
ALBUMIN: 4 g/dL (ref 3.3–5.5)
ALK PHOS: 62 U/L (ref 26–84)
ALT: 36 U/L (ref 10–47)
AST: 31 U/L (ref 11–38)
BILIRUBIN TOTAL: 0.9 mg/dL (ref 0.20–1.60)
BUN, Bld: 13 mg/dL (ref 7–22)
CALCIUM: 9.7 mg/dL (ref 8.0–10.3)
CO2: 29 mEq/L (ref 18–33)
CREATININE: 0.8 mg/dL (ref 0.6–1.2)
Chloride: 99 mEq/L (ref 98–108)
Glucose, Bld: 101 mg/dL (ref 73–118)
Potassium: 4.1 mEq/L (ref 3.3–4.7)
SODIUM: 142 meq/L (ref 128–145)
TOTAL PROTEIN: 7.5 g/dL (ref 6.4–8.1)

## 2017-05-04 LAB — CEA (IN HOUSE-CHCC): CEA (CHCC-IN HOUSE): 1.09 ng/mL (ref 0.00–5.00)

## 2017-05-04 NOTE — Progress Notes (Signed)
Hematology and Oncology Follow Up Visit  [name] [id number] 03/22/1931 84 y.o. 05/04/2017   Principle Diagnosis:   Stage II (T3N0M0) rectal cancer  Current Therapy:    Observation     Interim History:  [name] is back for follow-up.  As always, I see him yearly.  He likes to come back to see [place] once a year.  He just feels very confident with our examinations.  He has had no problems since we last saw him.  He has had no issues with his medicines.  He had no change in medications.  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  I suppose people just or not eating beef anymore.  I myself, enjoy eating a ton of beef.  He still has some postherpetic neuralgia.  He has had no bleeding.  He has had no change in bowel or bladder habits.  He has had no cough or shortness of breath.  Is been no leg swelling.  He says he might need cataract surgery.  He wants to try to hold off on this as long as possible.  Overall, his performance status is ECOG 0.  Medications:  Current Outpatient Medications:  .  budesonide-formoterol (SYMBICORT) 80-4.5 MCG/ACT inhaler, Inhale 2 puffs into the lungs as needed., Disp: , Rfl:  .  latanoprost (XALATAN) 0.005 % ophthalmic solution, Place 1 drop into both eyes at bedtime., Disp: , Rfl:  .  lisinopril-hydrochlorothiazide (PRINZIDE,ZESTORETIC) 20-25 MG per tablet, Take 1 tablet by mouth daily., Disp: , Rfl:   Allergies: No Known Allergies  Past Medical History, Surgical history, Social history, and Family History were reviewed and updated.  Review of Systems: Review of Systems  Constitutional: Negative.   HENT: Negative.   Eyes: Positive for blurred vision and redness.  Respiratory: Negative.   Cardiovascular: Negative.   Gastrointestinal: Negative.   Genitourinary: Negative.   Musculoskeletal: Negative.   Skin: Negative.   Neurological: Negative.   Endo/Heme/Allergies: Negative.   Psychiatric/Behavioral:  Negative.     Physical Exam:  weight is 139 lb 8 oz (63.3 kg). His oral temperature is 97.5 F (36.4 C) (abnormal). His blood pressure is 149/71 (abnormal) and his pulse is 58 (abnormal). His respiration is 18 and oxygen saturation is 97%.   Physical Exam  Constitutional: He is oriented to person, place, and time.  HENT:  Head: Normocephalic and atraumatic.  Mouth/Throat: Oropharynx is clear and moist.  Eyes: EOM are normal. Pupils are equal, round, and reactive to light.  Neck: Normal range of motion.  Cardiovascular: Normal rate, regular rhythm and normal heart sounds.  Pulmonary/Chest: Effort normal and breath sounds normal.  Abdominal: Soft. Bowel sounds are normal.  Musculoskeletal: Normal range of motion. He exhibits no edema, tenderness or deformity.  Lymphadenopathy:    He has no cervical adenopathy.  Neurological: He is alert and oriented to person, place, and time.  Skin: Skin is warm and dry. No rash noted. No erythema.  Psychiatric: He has a normal mood and affect. His behavior is normal. Judgment and thought content normal.  Vitals reviewed.    Lab Results  Component Value Date   WBC 5.2 05/04/2017   HGB 15.9 05/04/2017   HCT 45.1 05/04/2017   MCV 89 05/04/2017   PLT 176 05/04/2017     Chemistry      Component Value Date/Time   NA 142 05/04/2017 0741   NA 140 05/05/2016 0747   K 4.1 05/04/2017 0741  K 4.1 05/05/2016 0747   CL 99 05/04/2017 0741   CO2 29 05/04/2017 0741   CO2 27 05/05/2016 0747   BUN 13 05/04/2017 0741   BUN 20.5 05/05/2016 0747   CREATININE 0.8 05/04/2017 0741   CREATININE 1.0 05/05/2016 0747      Component Value Date/Time   CALCIUM 9.7 05/04/2017 0741   CALCIUM 10.2 05/05/2016 0747   ALKPHOS 62 05/04/2017 0741   ALKPHOS 79 05/05/2016 0747   AST 31 05/04/2017 0741   AST 30 05/05/2016 0747   ALT 36 05/04/2017 0741   ALT 46 05/05/2016 0747   BILITOT 0.90 05/04/2017 0741   BILITOT 0.74 05/05/2016 0747         Impression  and Plan: [name] is 84 year old gentleman with a remote history of rectal cancer. He has stage II disease. He had neoadjuvant chemotherapy and radiation therapy.  He is clearly cured of this. However, he likes to come back to see [place] yearly. He just gets "peace of mind" with, to see [place].  As always, will see him back in 1 year.  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  Volanda Napoleon, MD 1/3/20198:29 AM

## 2017-05-05 LAB — LIPID PANEL
CHOL/HDL RATIO: 3.2 ratio (ref 0.0–5.0)
Cholesterol, Total: 177 mg/dL (ref 100–199)
HDL: 56 mg/dL (ref >39)
LDL CALC: 106 mg/dL — AB (ref 0–99)
TRIGLYCERIDES: 76 mg/dL (ref 0–149)
VLDL CHOLESTEROL CAL: 15 mg/dL (ref 5–40)

## 2017-06-12 DIAGNOSIS — R69 Illness, unspecified: Secondary | ICD-10-CM | POA: Diagnosis not present

## 2017-08-10 ENCOUNTER — Other Ambulatory Visit: Payer: Self-pay | Admitting: Family Medicine

## 2017-08-10 ENCOUNTER — Ambulatory Visit
Admission: RE | Admit: 2017-08-10 | Discharge: 2017-08-10 | Disposition: A | Discharge status: Home / Self Care | Payer: Medicare HMO | Source: Ambulatory Visit | Attending: Family Medicine | Admitting: Family Medicine

## 2017-08-10 DIAGNOSIS — I10 Essential (primary) hypertension: Secondary | ICD-10-CM | POA: Diagnosis not present

## 2017-08-10 DIAGNOSIS — R0609 Other forms of dyspnea: Secondary | ICD-10-CM | POA: Diagnosis not present

## 2017-08-10 DIAGNOSIS — R0600 Dyspnea, unspecified: Secondary | ICD-10-CM | POA: Diagnosis not present

## 2017-08-10 LAB — BASIC METABOLIC PANEL
BUN: 14 (ref 4–21)
CREATININE: 0.9 (ref <=1.3)
Glucose: 91
Potassium: 4.3 (ref 3.4–5.3)
SODIUM: 139 (ref 137–147)

## 2017-08-14 ENCOUNTER — Encounter: Payer: Self-pay | Admitting: Internal Medicine

## 2017-08-14 DIAGNOSIS — R0609 Other forms of dyspnea: Secondary | ICD-10-CM | POA: Diagnosis not present

## 2017-08-15 DIAGNOSIS — H52223 Regular astigmatism, bilateral: Secondary | ICD-10-CM | POA: Diagnosis not present

## 2017-08-15 DIAGNOSIS — H524 Presbyopia: Secondary | ICD-10-CM | POA: Diagnosis not present

## 2017-08-15 DIAGNOSIS — H5203 Hypermetropia, bilateral: Secondary | ICD-10-CM | POA: Diagnosis not present

## 2017-09-05 DIAGNOSIS — H409 Unspecified glaucoma: Secondary | ICD-10-CM | POA: Diagnosis not present

## 2017-09-05 DIAGNOSIS — N529 Male erectile dysfunction, unspecified: Secondary | ICD-10-CM | POA: Diagnosis not present

## 2017-09-05 DIAGNOSIS — Z7722 Contact with and (suspected) exposure to environmental tobacco smoke (acute) (chronic): Secondary | ICD-10-CM | POA: Diagnosis not present

## 2017-09-05 DIAGNOSIS — Z823 Family history of stroke: Secondary | ICD-10-CM | POA: Diagnosis not present

## 2017-09-05 DIAGNOSIS — Z85038 Personal history of other malignant neoplasm of large intestine: Secondary | ICD-10-CM | POA: Diagnosis not present

## 2017-09-05 DIAGNOSIS — Z7951 Long term (current) use of inhaled steroids: Secondary | ICD-10-CM | POA: Diagnosis not present

## 2017-09-05 DIAGNOSIS — J45909 Unspecified asthma, uncomplicated: Secondary | ICD-10-CM | POA: Diagnosis not present

## 2017-09-05 DIAGNOSIS — I10 Essential (primary) hypertension: Secondary | ICD-10-CM | POA: Diagnosis not present

## 2017-09-14 ENCOUNTER — Ambulatory Visit: Payer: Medicare HMO | Admitting: Internal Medicine

## 2017-09-14 ENCOUNTER — Encounter: Payer: Self-pay | Admitting: Internal Medicine

## 2017-09-14 VITALS — BP 132/82 | HR 56 | Ht 67.5 in | Wt 145.6 lb

## 2017-09-14 DIAGNOSIS — I10 Essential (primary) hypertension: Secondary | ICD-10-CM

## 2017-09-14 DIAGNOSIS — J4540 Moderate persistent asthma, uncomplicated: Secondary | ICD-10-CM | POA: Insufficient documentation

## 2017-09-14 DIAGNOSIS — I351 Nonrheumatic aortic (valve) insufficiency: Secondary | ICD-10-CM

## 2017-09-14 MED ORDER — VALSARTAN-HYDROCHLOROTHIAZIDE 160-25 MG PO TABS: 1.0000 | ORAL_TABLET | Freq: Every day | ORAL | 11 refills | Status: DC

## 2017-09-14 MED ORDER — BUDESONIDE-FORMOTEROL FUMARATE 160-4.5 MCG/ACT IN AERO: 2.0000 | INHALATION_SPRAY | Freq: Two times a day (BID) | RESPIRATORY_TRACT | 0 refills | Status: DC

## 2017-09-14 NOTE — Patient Instructions (Addendum)
Symbicort 160 can be used up to 2 puffs every 12 hours  Work on inhaler technique:  relax and gently blow all the way out then take a nice smooth deep breath back in, triggering the inhaler at same time you start breathing in.  Hold for up to 5 seconds if you can. Blow out thru nose. Rinse and gargle with water when done     Stop lisinopril -hctz and start diovan 160-25 mg daily - call if problems    Please schedule a follow up office visit in 6 weeks, call sooner if needed with all medications /inhalers/ solutions in hand so we can verify exactly what you are taking. This includes all medications from all doctors and over the counters  - PFTs on return

## 2017-09-14 NOTE — Progress Notes (Signed)
Subjective:     Patient ID: [name], male   DOB: Nov 08, 1930,    MRN: [id number]  HPI  75 yowm never smoker with hx suggestive of longstanding asthma and  h/o cough attributed to to gerd and cc  variable sob/wheeze  for which inhalers seem  to help referred to pulmonary clinic 09/14/2017 by Dr  Arelia Sneddon with worse noct wheeze ? since starting ACEi and refractory to symb  160 dosed at 2bid though baseline hfa technique suboptimal.   . 09/14/2017 1st [facility] office visit/ Deandria Klute   Chief Complaint  Patient presents with  . Consult    SOB with excertion. wheezing at night.   doe x decades worse with fence post splitting and rapid walking assoc with noct wheeze while on ACEi and better with  use of Symbicort esp at bedtime but also used with "wheezing"  Am of ov (w/in 12 hours of last symb 160  dose)   No obvious day to day or daytime variability or assoc excess/ purulent sputum or mucus plugs or hemoptysis or cp or chest tightness,   or overt sinus or hb symptoms. No unusual exposure hx or h/o childhood pna/ asthma or knowledge of premature birth.    Also denies any obvious fluctuation of symptoms with weather or environmental changes or other aggravating or alleviating factors except as outlined above   Current Allergies, Complete Past Medical History, Past Surgical History, Family History, and Social History were reviewed in [employer] record.  ROS  The following are not active complaints unless bolded Hoarseness, sore throat, dysphagia, dental problems, itching, sneezing,  nasal congestion or discharge of excess mucus or purulent secretions, ear ache,   fever, chills, sweats, unintended wt loss or wt gain, classically pleuritic or exertional cp,  orthopnea pnd or arm/hand swelling  or leg swelling, presyncope, palpitations, abdominal pain, anorexia, nausea, vomiting, diarrhea  or change in bowel habits or change in bladder habits, change in stools or change  in urine, dysuria, hematuria,  rash, arthralgias, visual complaints, headache, numbness, weakness or ataxia or problems with walking or coordination,  change in mood or  memory.        Current Meds  Medication Sig  . budesonide-formoterol (SYMBICORT) 160-4.5 MCG/ACT inhaler Inhale 2 puffs into the lungs 2 (two) times daily.  Marland Kitchen latanoprost (XALATAN) 0.005 % ophthalmic solution Place 1 drop into both eyes at bedtime.  Marland Kitchen  ] budesonide-formoterol (SYMBICORT) 160-4.5 MCG/ACT inhaler Inhale 2 puffs into the lungs 2 (two) times daily.  Marland Kitchen   lisinopril-hydrochlorothiazide (PRINZIDE,ZESTORETIC) 20-25 MG per tablet Take 1 tablet by mouth daily.            Review of Systems     Objective:   Physical Exam pseudowheeze only    amb elderly wm nad - moderately hoarse with pseudowheeze only   Wt Readings from Last 3 Encounters:  09/14/17 145 lb 9.6 oz (66 kg)  05/04/17 139 lb 8 oz (63.3 kg)  05/05/16 144 lb (65.3 kg)     Vital signs reviewed - Note on arrival 02 sats  96% on RA     HEENT: nl   turbinates bilaterally, and oropharynx. Nl external ear canals without cough reflex   NECK :  without JVD/Nodes/TM/ nl carotid upstrokes bilaterally   LUNGS: no acc muscle use,  Nl contour chest which is clear to A and P bilaterally without cough on insp or exp maneuvers   CV:  RRR  no s3 or murmur  or increase in P2, and no edema   ABD:  soft and nontender with nl inspiratory excursion in the supine position. No bruits or organomegaly appreciated, bowel sounds nl  MS:  Nl gait/ ext warm without deformities, calf tenderness, cyanosis or clubbing No obvious joint restrictions   SKIN: warm and dry without lesions    NEURO:  alert, approp, nl sensorium with  no motor or cerebellar deficits apparent.     I personally reviewed images and agree with radiology impression as follows:  CXR:   08/10/17 Hyperinflation. No focal opacity or pleural effusion. Normal cardiomediastinal silhouette with  aortic atherosclerosis. No pneumothorax.    Assessment:

## 2017-09-15 ENCOUNTER — Encounter: Payer: Self-pay | Admitting: Internal Medicine

## 2017-09-15 DIAGNOSIS — I351 Nonrheumatic aortic (valve) insufficiency: Secondary | ICD-10-CM | POA: Insufficient documentation

## 2017-09-15 NOTE — Assessment & Plan Note (Addendum)
PFT's  10/06/09   FEV1 2.56 (100 % ) ratio 51  p no % improvement from saba p ? prior to study with DLCO  116%   09/14/2017  After extensive coaching inhaler device  effectiveness =    75% from baseline 25%   Previous w/u and spirometry from Dr Arelia Sneddon office c/w chronic asthma with possible fixed component in this pt who never smoked and symptoms are suddenly now difficult to control ? Why  DDX of  difficult airways management almost all start with A and  include Adherence, Ace Inhibitors, Acid Reflux, Active Sinus Disease, Alpha 1 Antitripsin deficiency, Anxiety masquerading as Airways dz,  ABPA,  Allergy(esp in young), Aspiration (esp in elderly), Adverse effects of meds,  Active smokers, A bunch of PE's (a small clot burden can't cause this syndrome unless there is already severe underlying pulm or vascular dz with poor reserve) plus two Bs  = Bronchiectasis and Beta blocker use..and one C= CHF  Adherence is always the initial "prime suspect" and is a multilayered concern that requires a "trust but verify" approach in every patient - starting with knowing how to use medications, especially inhalers, correctly, keeping up with refills and understanding the fundamental difference between maintenance and prns vs those medications only taken for a very short course and then stopped and not refilled.  - see hfa teaching - return with all meds in hand using a trust but verify approach to confirm accurate Medication  Reconciliation The principal here is that until we are certain that the  patients are doing what we've asked, it makes no sense to ask them to do more.   ACEi adverse effects at the  top of the usual list of suspects and the only way to rule it out is a trial off > see a/p    ? Allergy > lack of variablity against this  ? GERD >  Noct wheeze just as likely to be due to gerd so if not better off acei and taking symb 160 2bid then add pepcid 20 mg hs next   ? CHF >  Echo 08/14/17 mild /moderate  AI only but could be prone to "cardiac asthma" if bp goes up but note bnp nl 08/10/17 so less likely   Total time devoted to counseling  > 50 % of initial 60 min office visit:  review case with pt/ discussion of options/alternatives/ personally creating written customized instructions  in presence of pt  then going over those specific  Instructions directly with the pt including how to use all of the meds but in particular covering each new medication in detail and the difference between the maintenance= "automatic" meds and the prns using an action plan format for the latter (If this problem/symptom => do that organization reading Left to right).  Please see AVS from this visit for a full list of these instructions which I personally wrote for this pt and  are unique to this visit.   See device teaching which extended face to face time for this visit

## 2017-09-15 NOTE — Assessment & Plan Note (Signed)
Changed acei to arb 09/14/2017 due to noct "wheeze"  In the best review of chronic cough to date ( NEJM 2016 375 [id number]) ,  ACEi are now felt to cause cough in up to  20% of pts which is a 4 fold increase from previous reports and does not include the variety of non-specific complaints we see in pulmonary clinic in pts on ACEi but previously attributed to another dx like  Copd/asthma and  include PNDS, throat and chest congestion, "bronchitis", unexplained dyspnea and noct "strangling" sensations, and hoarseness, but also  atypical /refractory GERD symptoms like dysphagia and "bad heartburn"   The only way I know  to prove this is not an "ACEi Case" is a trial off ACEi x a minimum of 6 weeks then regroup.   Try  diovan 160-25 one daily and f/u with pfts in 6 weeks

## 2017-09-15 NOTE — Assessment & Plan Note (Signed)
See echo 08/14/17 with mild/mod AI  Per Wynonia Lawman    key is to keep afterload down so needs tight bp control /monitoring advised

## 2017-10-30 ENCOUNTER — Ambulatory Visit: Payer: Medicare HMO | Admitting: Internal Medicine

## 2017-10-30 ENCOUNTER — Encounter: Payer: Self-pay | Admitting: Internal Medicine

## 2017-10-30 ENCOUNTER — Ambulatory Visit (INDEPENDENT_AMBULATORY_CARE_PROVIDER_SITE_OTHER): Payer: Medicare HMO | Admitting: Internal Medicine

## 2017-10-30 VITALS — BP 130/78 | HR 62 | Ht 66.0 in | Wt 143.4 lb

## 2017-10-30 DIAGNOSIS — I10 Essential (primary) hypertension: Secondary | ICD-10-CM

## 2017-10-30 DIAGNOSIS — J4540 Moderate persistent asthma, uncomplicated: Secondary | ICD-10-CM | POA: Diagnosis not present

## 2017-10-30 LAB — PULMONARY FUNCTION TEST
DL/VA % PRED: 89 %
DL/VA: 3.87 ml/min/mmHg/L
DLCO UNC: 19.45 ml/min/mmHg
DLCO unc % pred: 72 %
FEF 25-75 Post: 1.5 L/sec
FEF 25-75 Pre: 0.62 L/sec
FEF2575-%CHANGE-POST: 143 %
FEF2575-%PRED-POST: 108 %
FEF2575-%Pred-Pre: 44 %
FEV1-%CHANGE-POST: 39 %
FEV1-%PRED-PRE: 59 %
FEV1-%Pred-Post: 82 %
FEV1-Post: 1.8 L
FEV1-Pre: 1.29 L
FEV1FVC-%CHANGE-POST: -1 %
FEV1FVC-%PRED-PRE: 78 %
FEV6-%Change-Post: 42 %
FEV6-%PRED-POST: 109 %
FEV6-%Pred-Pre: 76 %
FEV6-PRE: 2.24 L
FEV6-Post: 3.19 L
FEV6FVC-%CHANGE-POST: 0 %
FEV6FVC-%Pred-Post: 106 %
FEV6FVC-%Pred-Pre: 105 %
FVC-%Change-Post: 41 %
FVC-%PRED-POST: 102 %
FVC-%Pred-Pre: 72 %
FVC-PRE: 2.3 L
FVC-Post: 3.26 L
POST FEV1/FVC RATIO: 55 %
PRE FEV6/FVC RATIO: 97 %
Post FEV6/FVC ratio: 98 %
Pre FEV1/FVC ratio: 56 %
RV % pred: 210 %
RV: 5.33 L
TLC % pred: 133 %
TLC: 8.39 L

## 2017-10-30 MED ORDER — BUDESONIDE-FORMOTEROL FUMARATE 160-4.5 MCG/ACT IN AERO: 2.0000 | INHALATION_SPRAY | Freq: Two times a day (BID) | RESPIRATORY_TRACT | 11 refills | Status: DC

## 2017-10-30 NOTE — Progress Notes (Signed)
Patient had full PFT test today.

## 2017-10-30 NOTE — Progress Notes (Signed)
Subjective:     Patient ID: [name], male   DOB: 06/07/1930,    MRN: [id number]    Brief patient profile:  35 yowm never smoker with hx suggestive of longstanding asthma and  h/o cough attributed to to gerd and cc  variable sob/wheeze  for which inhalers seem  to help referred to pulmonary clinic 09/14/2017 by Dr Arelia Sneddon with worse noct wheeze ? since starting ACEi and refractory to symb  160 dosed at 2bid though baseline hfa technique suboptimal.   .History of Present Illness  09/14/2017 1st [facility] office visit/ Tonique Mendonca   Chief Complaint  Patient presents with  . Consult    SOB with excertion. wheezing at night.   doe x decades worse with fence post splitting and rapid walking assoc with noct wheeze while on ACEi and better with  use of Symbicort esp at bedtime but also used with "wheezing"  Am of ov (w/in 12 hours of last symb 160  dose)  rec Symbicort 160 can be used up to 2 puffs every 12 hours Work on inhaler technique:  relax and gently blow all the way out then take a nice smooth deep breath back in, triggering the inhaler at same time you start breathing in.  Hold for up to 5 seconds if you can. Blow out thru nose. Rinse and gargle with water when done Stop lisinopril -hctz and start diovan 160-25 mg daily - call if problems  Please schedule a follow up office visit in 6 weeks, call sooner if needed with all medications /inhalers/ solutions in hand so we can verify exactly what you are taking. This includes all medications from all doctors and over the counters  - PFTs on return    10/30/2017  f/u ov/Emery Binz re:  Moderate asthma  Chief Complaint  Patient presents with  . Follow-up    Pt has SOB with exertion when really active, doing well overall.   Dyspnea: better when use symbicort 160  Cough: none  SABA use: none  No obvious day to day or daytime variability or assoc excess/ purulent sputum or mucus plugs or hemoptysis or cp or chest tightness, subjective wheeze  or overt sinus or hb symptoms.   Sleeping: well 2 pillows  without nocturnal  or early am exacerbation  of respiratory  c/o's or need for noct saba. Also denies any obvious fluctuation of symptoms with weather or environmental changes or other aggravating or alleviating factors except as outlined above   No unusual exposure hx or h/o childhood pna/ asthma or knowledge of premature birth.  Current Allergies, Complete Past Medical History, Past Surgical History, Family History, and Social History were reviewed in [employer] record.  ROS  The following are not active complaints unless bolded Hoarseness, sore throat, dysphagia, dental problems, itching, sneezing,  nasal congestion or discharge of excess mucus or purulent secretions, ear ache,   fever, chills, sweats, unintended wt loss or wt gain, classically pleuritic or exertional cp,  orthopnea pnd or arm/hand swelling  or leg swelling, presyncope, palpitations, abdominal pain, anorexia, nausea, vomiting, diarrhea  or change in bowel habits or change in bladder habits, change in stools or change in urine, dysuria, hematuria,  rash, arthralgias, visual complaints, headache, numbness, weakness or ataxia or problems with walking or coordination,  change in mood or  memory.         Meds: [id number]  One daily  Objective:   Physical Exam     amb wm still mod hoarse    10/30/2017         143   09/14/17 145 lb 9.6 oz (66 kg)  05/04/17 139 lb 8 oz (63.3 kg)  05/05/16 144 lb (65.3 kg)     Vital signs reviewed - Note on arrival 02 sats  97% on RA  And bp 130/78  HEENT: nl dentition, turbinates bilaterally, and oropharynx. Nl external ear canals without cough reflex   NECK :  without JVD/Nodes/TM/ nl carotid upstrokes bilaterally   LUNGS: no acc muscle use,  Nl contour chest which is clear to A and P bilaterally without cough on insp or exp maneuvers   CV:  RRR  no s3 or murmur or increase in P2, and  no edema   ABD:  soft and nontender with nl inspiratory excursion in the supine position. No bruits or organomegaly appreciated, bowel sounds nl  MS:  Nl gait/ ext warm without deformities, calf tenderness, cyanosis or clubbing No obvious joint restrictions   SKIN: warm and dry without lesions    NEURO:  alert, approp, nl sensorium with  no motor or cerebellar deficits apparent.        I personally reviewed images again 10/30/2017   CXR:   08/10/17 Hyperinflation. No focal opacity or pleural effusion. Normal cardiomediastinal silhouette with aortic atherosclerosis. No pneumothorax.    Assessment:

## 2017-10-30 NOTE — Patient Instructions (Addendum)
Continue symbiocrt 160 Take 2 puffs first thing in am and then another 2 puffs about 12 hours later.   Work on inhaler technique:  relax and gently blow all the way out then take a nice smooth deep breath back in, triggering the inhaler at same time you start breathing in.  Hold for up to 5 seconds if you can. Blow out thru nose. Rinse and gargle with water when done   Please schedule a follow up visit in 3 months but call sooner if needed

## 2017-10-31 ENCOUNTER — Encounter: Payer: Self-pay | Admitting: Internal Medicine

## 2017-10-31 NOTE — Assessment & Plan Note (Signed)
PFT's  10/06/09   FEV1 2.56 (100 % ) ratio 51  p no % improvement from saba p ? prior to study with DLCO  116%    - PFT's  10/30/2017  FEV1 1.80 (39 % ) ratio 55   p 39 % improvement from saba p nothing prior to study with DLCO  72 % corrects to 89 % for alv volume   - 10/30/2017  After extensive coaching inhaler device  effectiveness =    75% > symbicort 160   2 bid  He has much more asthma than I anticipated and note as long as he stays on symbicort 160 2bid he does fine.     I used  the analogy of putting steroid cream on a rash to help explain the meaning of topical therapy and the need to get the drug to the target tissue.    F/u can be q 3 m   I had an extended discussion with the patient reviewing all relevant studies completed to date and  lasting 15 to 20 minutes of a 25 minute visit    See device teaching which extended face to face time for this visit.  Each maintenance medication was reviewed in detail including emphasizing most importantly the difference between maintenance and prns and under what circumstances the prns are to be triggered using an action plan format that is not reflected in the computer generated alphabetically organized AVS which I have not found useful in most complex patients, especially with respiratory illnesses  Please see AVS for specific instructions unique to this visit that I personally wrote and verbalized to the the pt in detail and then reviewed with pt  by my nurse highlighting any  changes in therapy recommended at today's visit to their plan of care.

## 2017-10-31 NOTE — Assessment & Plan Note (Signed)
Although even in retrospect it may not be clear the ACEi contributed to the pt's symptoms,  Pt improved off them and adding them back at this point or in the future would risk confusion in interpretation of non-specific respiratory symptoms to which this patient is prone  ie  Better not to muddy the waters here.   Adequate control on present rx, reviewed in detail with pt > no change in rx needed  = diovan 160-25 mg daily

## 2017-11-01 DIAGNOSIS — H02411 Mechanical ptosis of right eyelid: Secondary | ICD-10-CM | POA: Diagnosis not present

## 2017-11-01 DIAGNOSIS — D492 Neoplasm of unspecified behavior of bone, soft tissue, and skin: Secondary | ICD-10-CM | POA: Diagnosis not present

## 2017-11-01 DIAGNOSIS — H02112 Cicatricial ectropion of right lower eyelid: Secondary | ICD-10-CM | POA: Diagnosis not present

## 2017-11-15 DIAGNOSIS — D492 Neoplasm of unspecified behavior of bone, soft tissue, and skin: Secondary | ICD-10-CM | POA: Diagnosis not present

## 2017-11-15 DIAGNOSIS — L821 Other seborrheic keratosis: Secondary | ICD-10-CM | POA: Diagnosis not present

## 2017-12-12 DIAGNOSIS — R69 Illness, unspecified: Secondary | ICD-10-CM | POA: Diagnosis not present

## 2017-12-18 NOTE — Progress Notes (Addendum)
Subjective:    Patient ID: [name], male    DOB: 28-May-1930, 84 y.o.   MRN: [id number]  HPI:  [name] is here to establish as a new pt.  He is a pleasant 84 year old male. PMH: HTN, HLD, Aortic Insufficiency,  Occular Shingles 20 years ago- affected OD Has has portions of R lower eyelid removed, followed by Ophthalmologist every 6 months Rectal CA- Dx'Huber 1999 Chemo/radiation Surgery 04/09/1998, in remission since then Followed by Oncology/[name] every Jan  Last CEA level 05/04/2017- 1.09 (normal 0.00-5.00) He denies current GI sx's  April 2019- Transthoracic Echo- Mild LVH with normal wall motion, EF 60% Mild-mod aortic regurg Mild (grade 1) mitral regurg Trace tricuspid/pulmonis regurg Follow-up with [name] not required per pt. 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 [name] is currently treating his asthma and HTN- he would like [place] to take over her antihypertensives and symbicort from pulmonoloy once he is released from practice He denies tobacco use He enjoys 2-3 [employer]'s an evening He reports a great support system of local friends and "a lady friend"- that he spends meal times with and occasional trips to the beach  Patient Care Team    Relationship Specialty Notifications Start End  Eric Marble D, NP PCP - General Family Medicine  12/19/17   [name] Referring Physician Family Medicine  12/19/17   [name] Consulting Physician Pulmonary Disease  12/19/17   [name] Consulting Physician Dermatology  12/19/17   [name] Referring Physician Ophthalmology  12/19/17     Patient Active Problem List   Diagnosis Date Noted  . Aortic insufficiency 09/15/2017  . Chronic asthma, moderate persistent, uncomplicated 60/63/0160  . Hyperlipidemia, mixed 05/06/2015  . Rectal cancer ([place])  05/12/2011  . Essential hypertension 08/25/2009  . ASTHMA, UNSPECIFIED 08/25/2009  . DYSPNEA 08/25/2009     Past Medical History:  Diagnosis Date  . Asthma   . Depression    wife died 2 months ago. 2011/05/24  . Eyelid cancer   . Hyperlipidemia, mixed 05/06/2015  . Hypertension   . Rectal cancer ([place]) 1999     Past Surgical History:  Procedure Laterality Date  . COLON RESECTION  1999  . cyst eyelid  2012  . HAND TUMOR EXCISION  2010   right     Family History  Problem Relation Age of Onset  . Hypertension Father   . Stroke Father   . Suicidality Brother   . Colon cancer Neg Hx   . Rectal cancer Neg Hx      Social History   Substance and Sexual Activity  Drug Use No     Social History   Substance and Sexual Activity  Alcohol Use Yes  . Alcohol/week: 16.0 standard drinks  . Types: 16 Cans of beer per week     Social History   Tobacco Use  Smoking Status Never Smoker  Smokeless Tobacco Never Used  Tobacco Comment   Never Used Tobacco     Outpatient Encounter Medications as of 12/19/2017  Medication Sig  . betamethasone dipropionate (DIPROLENE) 0.05 % cream Apply 1 application topically as needed.  . budesonide-formoterol (SYMBICORT) 160-4.5 MCG/ACT inhaler Inhale 2 puffs into the lungs 2 (two) times daily.  Marland Kitchen latanoprost (XALATAN) 0.005 % ophthalmic solution Place 1 drop into  both eyes at bedtime.  . valsartan-hydrochlorothiazide (DIOVAN HCT) 160-25 MG tablet Take 1 tablet by mouth daily.  . [DISCONTINUED] DTaP-hepatitis B recombinant-IPV (PEDIARIX) injection Inject 0.5 mLs into the muscle once for 1 dose.   No facility-administered encounter medications on file as of 12/19/2017.     Allergies: Patient has no known allergies.  Body mass index is 24.18 kg/m.  Blood pressure (!) 166/85, pulse 67, height 5\' 6"  (1.676 m), weight 149 lb 12.8 oz (67.9 kg).  Review of Systems  Constitutional: Negative for activity change, appetite change,  chills, diaphoresis, fatigue, fever and unexpected weight change.  Eyes: Negative for visual disturbance.  Respiratory: Negative for cough, chest tightness, shortness of breath, wheezing and stridor.   Cardiovascular: Negative for chest pain, palpitations and leg swelling.  Gastrointestinal: Negative for abdominal distention, abdominal pain, anal bleeding, constipation, diarrhea, nausea and rectal pain.  Endocrine: Negative for cold intolerance, heat intolerance, polydipsia, polyphagia and polyuria.  Genitourinary: Negative for difficulty urinating and flank pain.  Musculoskeletal: Positive for arthralgias and myalgias.  Skin: Negative for color change, pallor, rash and wound.  Neurological: Negative for dizziness and headaches.  Hematological: Does not bruise/bleed easily.  Psychiatric/Behavioral: Negative for behavioral problems, confusion, decreased concentration, dysphoric mood, hallucinations, self-injury, sleep disturbance and suicidal ideas. The patient is not nervous/anxious and is not hyperactive.        Objective:   Physical Exam  Constitutional: He is oriented to person, place, and time. He appears well-developed and well-nourished. No distress.  HENT:  Head: Normocephalic and atraumatic.  Right Ear: External ear normal.  Left Ear: External ear normal.  Nose: Nose normal.  Mouth/Throat: Oropharynx is clear and moist.  Eyes: Pupils are equal, round, and reactive to light. EOM are normal. Right conjunctiva is injected.  Cardiovascular: Normal rate, regular rhythm, normal heart sounds and intact distal pulses.  Pulmonary/Chest: Effort normal and breath sounds normal. No stridor. No respiratory distress. He has no wheezes. He has no rales.  Neurological: He is alert and oriented to person, place, and time.  Skin: Skin is warm and dry. Capillary refill takes less than 2 seconds. No rash noted. He is not diaphoretic. No erythema. No pallor.  Psychiatric: He has a normal mood and  affect. His behavior is normal. Judgment and thought content normal.  Nursing note and vitals reviewed.     Assessment & Plan:   1. Need for Tdap vaccination   2. Rectal cancer ([place])   3. Hyperlipidemia, mixed   4. Essential hypertension     Rectal cancer Dx'Huber 1999 Chemo/radiation Surgery 04/09/1998, in remission since then Followed by Oncology/[name] every Jan  Last CEA level 05/04/2017- 1.09 (normal 0.00-5.00) He denies current GI sx's   Hyperlipidemia, mixed 05/2017 LDL- 106 He eats primarily a plant based diet Will have fasting labs repeated in 05/2018  Essential hypertension Both BP readings above goal Currently on Valsartan/HCTZ (Diovan) 160/25mg  QD Previously on ACE that was stopped to nocturnal wheeze BP currently managed by Dr. Wende Mott We will take over med management after he is released from that practice  ASTHMA, UNSPECIFIED Followed by [name] Never smoker Currently on Symbicort  Has f/u next month    FOLLOW-UP:  Return in about 6 months (around 06/21/2018).

## 2017-12-19 ENCOUNTER — Encounter: Payer: Self-pay | Admitting: Adult Health

## 2017-12-19 ENCOUNTER — Ambulatory Visit (INDEPENDENT_AMBULATORY_CARE_PROVIDER_SITE_OTHER): Payer: Medicare HMO | Admitting: Adult Health

## 2017-12-19 VITALS — BP 166/85 | HR 67 | Ht 66.0 in | Wt 149.8 lb

## 2017-12-19 DIAGNOSIS — C20 Malignant neoplasm of rectum: Secondary | ICD-10-CM

## 2017-12-19 DIAGNOSIS — E782 Mixed hyperlipidemia: Secondary | ICD-10-CM | POA: Diagnosis not present

## 2017-12-19 DIAGNOSIS — I10 Essential (primary) hypertension: Secondary | ICD-10-CM

## 2017-12-19 DIAGNOSIS — Z23 Encounter for immunization: Secondary | ICD-10-CM

## 2017-12-19 MED ORDER — DTAP-HEPATITIS B RECOMB-IPV IM SUSP: 0.5000 mL | Freq: Once | INTRAMUSCULAR | 0 refills | Status: DC

## 2017-12-19 NOTE — Assessment & Plan Note (Signed)
05/2017 LDL- 106 He eats primarily a plant based diet Will have fasting labs repeated in 05/2018

## 2017-12-19 NOTE — Assessment & Plan Note (Signed)
Dx'd 1999 Chemo/radiation Surgery 04/09/1998, in remission since then Followed by Oncology/Dr. Marin Olp every Jan  Last CEA level 05/04/2017- 1.09 (normal 0.00-5.00) He denies current GI sx's

## 2017-12-19 NOTE — Assessment & Plan Note (Signed)
Followed by Dr. Melvyn Novas Never smoker Currently on Symbicort  Has f/u next month

## 2017-12-19 NOTE — Assessment & Plan Note (Signed)
>>  ASSESSMENT AND PLAN FOR ASTHMA WRITTEN ON 12/19/2017 12:21 PM BY DANFORD, Orpha Bur D, NP  Followed by Dr. Sherene Sires Never smoker Currently on Symbicort  Has f/u next month

## 2017-12-19 NOTE — Patient Instructions (Addendum)

## 2017-12-19 NOTE — Assessment & Plan Note (Signed)
Both BP readings above goal Currently on Valsartan/HCTZ (Diovan) 160/25mg  QD Previously on ACE that was stopped to nocturnal wheeze BP currently managed by Dr. Wende Mott We will take over med management after he is released from that practice

## 2017-12-21 ENCOUNTER — Telehealth: Payer: Self-pay

## 2017-12-21 NOTE — Telephone Encounter (Signed)
Thank you! Chart updated. Sincerely, Valetta Fuller

## 2017-12-21 NOTE — Telephone Encounter (Signed)
Per pt, Dr. Arelia Sneddon informed him that echo was normal and that no follow up was needed.  Charyl Bigger, CMA

## 2017-12-21 NOTE — Telephone Encounter (Signed)
Pt saw Dr. Wynonia Lawman and had a transthoracic echo done 07/2017.  Mina Marble, NP requested that pt be called to find out if he was to have f/u with Dr. Wynonia Lawman.  Charyl Bigger, CMA

## 2017-12-25 DIAGNOSIS — R69 Illness, unspecified: Secondary | ICD-10-CM | POA: Diagnosis not present

## 2017-12-26 ENCOUNTER — Other Ambulatory Visit: Payer: Self-pay

## 2017-12-26 DIAGNOSIS — Z23 Encounter for immunization: Secondary | ICD-10-CM

## 2017-12-26 NOTE — Telephone Encounter (Signed)
Received fax from pharmacy stating that RX was sent in for pt for pediarix, which is not indicated for this patient.  RX was supposed to be for Boostrix.  RX sent to pharmacy for Boostrix.  Charyl Bigger, CMA

## 2017-12-28 ENCOUNTER — Other Ambulatory Visit: Payer: Self-pay

## 2017-12-28 MED ORDER — TETANUS-DIPHTH-ACELL PERTUSSIS 5-2.5-18.5 LF-MCG/0.5 IM SUSP: 0.5000 mL | Freq: Once | INTRAMUSCULAR | 0 refills | Status: AC

## 2018-01-07 DIAGNOSIS — Z23 Encounter for immunization: Secondary | ICD-10-CM | POA: Diagnosis not present

## 2018-02-01 ENCOUNTER — Encounter: Payer: Self-pay | Admitting: Internal Medicine

## 2018-02-01 ENCOUNTER — Other Ambulatory Visit (INDEPENDENT_AMBULATORY_CARE_PROVIDER_SITE_OTHER): Payer: Medicare HMO

## 2018-02-01 ENCOUNTER — Ambulatory Visit: Payer: Medicare HMO | Admitting: Internal Medicine

## 2018-02-01 VITALS — BP 160/82 | HR 66 | Ht 66.0 in | Wt 148.8 lb

## 2018-02-01 DIAGNOSIS — J4540 Moderate persistent asthma, uncomplicated: Secondary | ICD-10-CM | POA: Diagnosis not present

## 2018-02-01 DIAGNOSIS — I10 Essential (primary) hypertension: Secondary | ICD-10-CM

## 2018-02-01 LAB — CBC WITH DIFFERENTIAL/PLATELET
BASOS ABS: 0 10*3/uL (ref 0.0–0.1)
Basophils Relative: 0.7 % (ref 0.0–3.0)
Eosinophils Absolute: 0.3 10*3/uL (ref 0.0–0.7)
Eosinophils Relative: 5.4 % — ABNORMAL HIGH (ref 0.0–5.0)
HEMATOCRIT: 44 % (ref 39.0–52.0)
HEMOGLOBIN: 15.1 g/dL (ref 13.0–17.0)
LYMPHS PCT: 17.6 % (ref 12.0–46.0)
Lymphs Abs: 1.1 10*3/uL (ref 0.7–4.0)
MCHC: 34.3 g/dL (ref 30.0–36.0)
MCV: 93 fl (ref 78.0–100.0)
Monocytes Absolute: 0.6 10*3/uL (ref 0.1–1.0)
Monocytes Relative: 10.1 % (ref 3.0–12.0)
NEUTROS ABS: 4 10*3/uL (ref 1.4–7.7)
Neutrophils Relative %: 66.2 % (ref 43.0–77.0)
PLATELETS: 209 10*3/uL (ref 150.0–400.0)
RBC: 4.73 Mil/uL (ref 4.22–5.81)
RDW: 13 % (ref 11.5–15.5)
WBC: 6 10*3/uL (ref 4.0–10.5)

## 2018-02-01 MED ORDER — BUDESONIDE-FORMOTEROL FUMARATE 160-4.5 MCG/ACT IN AERO: 2.0000 | INHALATION_SPRAY | Freq: Two times a day (BID) | RESPIRATORY_TRACT | 0 refills | Status: DC

## 2018-02-01 MED ORDER — ALBUTEROL SULFATE HFA 108 (90 BASE) MCG/ACT IN AERS: INHALATION_SPRAY | RESPIRATORY_TRACT | 1 refills | Status: DC

## 2018-02-01 NOTE — Patient Instructions (Addendum)
Work on inhaler technique:  relax and gently blow all the way out then take a nice smooth deep breath back in, triggering the inhaler at same time you start breathing in.  Hold for up to 5 seconds if you can. Blow out thru nose. Rinse and gargle with water when done      Only use your albuterol as a rescue medication to be used if you can't catch your breath by resting or doing a relaxed purse lip breathing pattern.  - The less you use it, the better it will work when you need it. - Ok to use up to 2 puffs  every 4 hours if you must but call for immediate appointment if use goes up over your usual need - Don't leave home without it !!  (think of it like the spare tire for your car)    Please schedule a follow up visit in 3 months but call sooner if needed  with all medications /inhalers/ solutions in hand so we can verify exactly what you are taking. This includes all medications from all doctors and over the counters

## 2018-02-01 NOTE — Progress Notes (Signed)
Subjective:     Patient ID: [name], male   DOB: 09/22/1930,    MRN: [id number]    Brief patient profile:  65 yowm never smoker with hx suggestive of longstanding asthma and  h/o cough attributed to to gerd and cc  variable sob/wheeze  for which inhalers seem  to help referred to pulmonary clinic 09/14/2017 by Dr Arelia Sneddon with worse noct wheeze ? since starting ACEi and refractory to symb  160 dosed at 2bid though baseline hfa technique suboptimal.     History of Present Illness  09/14/2017 1st [facility] office visit/ [facility]   Chief Complaint  Patient presents with  . Consult    SOB with excertion. wheezing at night.   doe x decades worse with fence post splitting and rapid walking assoc with noct wheeze while on ACEi and better with  use of Symbicort esp at bedtime but also used with "wheezing"  Am of ov (w/in 12 hours of last symb 160  dose)  rec Symbicort 160 can be used up to 2 puffs every 12 hours Work on inhaler technique:  relax and gently blow all the way out then take a nice smooth deep breath back in, triggering the inhaler at same time you start breathing in.  Hold for up to 5 seconds if you can. Blow out thru nose. Rinse and gargle with water when done Stop lisinopril -hctz and start diovan 160-25 mg daily - call if problems  Please schedule a follow up office visit in 6 weeks, call sooner if needed with all medications /inhalers/ solutions in hand so we can verify exactly what you are taking. This includes all medications from all doctors and over the counters  - PFTs on return    10/30/2017  f/u ov/[facility] re:  Moderate asthma  Chief Complaint  Patient presents with  . Follow-up    Pt has SOB with exertion when really active, doing well overall.   Dyspnea: better when use symbicort 160  Cough: none SABA use: none rec Continue symbiocrt 160 Take 2 puffs first thing in am and then another 2 puffs about 12 hours later.  Work on inhaler technique:   Please  schedule a follow up visit in 3 months but call sooner if needed   02/01/2018  f/u ov/[facility] re:  Chief Complaint  Patient presents with  . Follow-up    Breathing is much improved. He does not have a rescue inhaler.   Dyspnea:  Not limited by breathing from desired activities   Cough: no Sleeping: electric up 30 degrees - originally started using for wife and stayed that way p she died  SABA use: none 02: none    No obvious day to day or daytime variability or assoc excess/ purulent sputum or mucus plugs or hemoptysis or cp or chest tightness, subjective wheeze or overt sinus or hb symptoms.   Sleeping fine as above without nocturnal  or early am exacerbation  of respiratory  c/o's or need for noct saba. Also denies any obvious fluctuation of symptoms with weather or environmental changes or other aggravating or alleviating factors except as outlined above   No unusual exposure hx or h/o childhood pna/ asthma or knowledge of premature birth.  Current Allergies, Complete Past Medical History, Past Surgical History, Family History, and Social History were reviewed in [employer] record.  ROS  The following are not active complaints unless bolded Hoarseness, sore throat, dysphagia, dental problems, itching, sneezing,  nasal congestion  or discharge of excess mucus or purulent secretions, ear ache,   fever, chills, sweats, unintended wt loss or wt gain, classically pleuritic or exertional cp,  Orthopnea(denies this is why sleeps @ 30 degrees, "just got use to it"  pnd or arm/hand swelling  or leg swelling, presyncope, palpitations, abdominal pain, anorexia, nausea, vomiting, diarrhea  or change in bowel habits or change in bladder habits, change in stools or change in urine, dysuria, hematuria,  rash, arthralgias, visual complaints, headache, numbness, weakness or ataxia or problems with walking or coordination,  change in mood or  memory.        Current Meds  Medication  Sig  . betamethasone dipropionate (DIPROLENE) 0.05 % cream Apply 1 application topically as needed.  . budesonide-formoterol (SYMBICORT) 160-4.5 MCG/ACT inhaler Inhale 2 puffs into the lungs 2 (two) times daily.  Marland Kitchen latanoprost (XALATAN) 0.005 % ophthalmic solution Place 1 drop into both eyes at bedtime.  . valsartan-hydrochlorothiazide (DIOVAN HCT) 160-25 MG tablet Take 1 tablet by mouth daily.            Objective:   Physical Exam    amb wm min hoarse nad   02/01/2018       148   10/30/2017         143   09/14/17 145 lb 9.6 oz (66 kg)  05/04/17 139 lb 8 oz (63.3 kg)  05/05/16 144 lb (65.3 kg)     Vital signs reviewed - Note on arrival 02 sats  96% on RA and bp 160/82 p ? Missed dose      HEENT: nl dentition, turbinates bilaterally, and oropharynx. Nl external ear canals without cough reflex   NECK :  without JVD/Nodes/TM/ nl carotid upstrokes bilaterally   LUNGS: no acc muscle use,  Nl contour chest with distant mid exp wheeze   Bilaterally better with plm but without cough on insp or exp maneuvers   CV:  RRR  no s3 or murmur or increase in P2, and no edema   ABD:  soft and nontender with nl inspiratory excursion in the supine position. No bruits or organomegaly appreciated, bowel sounds nl  MS:  Nl gait/ ext warm without deformities, calf tenderness, cyanosis or clubbing No obvious joint restrictions   SKIN: warm and dry without lesions    NEURO:  alert, approp, nl sensorium with  no motor or cerebellar deficits apparent.             Assessment:

## 2018-02-02 LAB — RESPIRATORY ALLERGY PROFILE REGION II ~~LOC~~
Allergen, A. alternata, m6: <0.1 kU/L
Allergen, Cedar tree, t12: <0.1 kU/L
Allergen, Comm Silver Birch, t9: <0.1 kU/L
Allergen, Cottonwood, t14: <0.1 kU/L
Allergen, Mouse Urine Protein, e78: <0.1 kU/L
Allergen, Mulberry, t76: <0.1 kU/L
Aspergillus fumigatus, m3: <0.1 kU/L
Bermuda Grass: <0.1 kU/L
CLADOSPORIUM HERBARUM (M2) IGE: <0.1 kU/L
CLASS: 0
CLASS: 0
CLASS: 0
CLASS: 0
CLASS: 0
CLASS: 0
CLASS: 0
CLASS: 0
CLASS: 0
CLASS: 0
COMMON RAGWEED (SHORT) (W1) IGE: <0.1 kU/L
Cat Dander: <0.1 kU/L
Class: 0
Class: 0
Class: 0
Class: 0
Class: 0
Class: 0
Class: 0
Class: 0
Class: 0
Class: 0
Class: 0
Class: 0
Class: 0
Class: 0
Dog Dander: <0.1 kU/L
IGE (IMMUNOGLOBULIN E), SERUM: 35 kU/L (ref <=114)
Pecan/Hickory Tree IgE: <0.1 kU/L
Rough Pigweed  IgE: <0.1 kU/L
Sheep Sorrel IgE: <0.1 kU/L
Timothy Grass: <0.1 kU/L

## 2018-02-02 LAB — INTERPRETATION:

## 2018-02-04 ENCOUNTER — Encounter: Payer: Self-pay | Admitting: Internal Medicine

## 2018-02-04 NOTE — Assessment & Plan Note (Signed)
PFT's  10/06/09   FEV1 2.56 (100 % ) ratio 51  p no % improvement from saba p ? prior to study with DLCO  116%  - PFT's  10/30/2017  FEV1 1.80 (82 % ) ratio 55   p 39 % improvement from saba p nothing prior to study with DLCO  72 % corrects to 89 % for alv volume   - 10/30/2017  symbicort 160   2 bid  - 02/01/2018  After extensive coaching inhaler device,  effectiveness =    75% from baseline 25% (very late insp p trigger, short Ti) - Spirometry 02/01/2018  FEV1 1.3 (59%)  Ratio 56 p am symb 160 with poor hfa   - Allergy profile 02/01/2018 >  Eos 0.3/  IgE  35 RAST neg   Despite poor hfa at baseline, he remains mostly asymptomatic but if can't master hfa better may need to consider change to DPI next ov    I had an extended discussion with the patient reviewing all relevant studies completed to date and  lasting 15 to 20 minutes of a 25 minute visit    See device teaching which extended face to face time for this visit.  Each maintenance medication was reviewed in detail including emphasizing most importantly the difference between maintenance and prns and under what circumstances the prns are to be triggered using an action plan format that is not reflected in the computer generated alphabetically organized AVS which I have not found useful in most complex patients, especially with respiratory illnesses  Please see AVS for specific instructions unique to this visit that I personally wrote and verbalized to the the pt in detail and then reviewed with pt  by my nurse highlighting any  changes in therapy recommended at today's visit to their plan of care.

## 2018-02-04 NOTE — Assessment & Plan Note (Signed)
Admits to not being sure he's taking his meds appropriately  Suggested using pill organizer and return  with all meds in hand using a trust but verify approach to confirm accurate Medication  Reconciliation The principal here is that until we are certain that the  patients are doing what we've asked, it makes no sense to ask them to do more.

## 2018-02-05 ENCOUNTER — Telehealth: Payer: Self-pay | Admitting: Internal Medicine

## 2018-02-05 NOTE — Telephone Encounter (Signed)
Notes recorded by Tanda Rockers, MD on 02/04/2018 at 11:17 AM EDT Call patient : Studies are unremarkable, no change in recs ------------------------------ Spoke with pt. He is aware of results. Nothing further was needed.

## 2018-02-05 NOTE — Progress Notes (Signed)
LMTCB

## 2018-02-05 NOTE — Telephone Encounter (Signed)
Pt returning call. Pt contact number [phone]

## 2018-02-07 NOTE — Progress Notes (Signed)
Pt was notified of results

## 2018-02-08 DIAGNOSIS — C4442 Squamous cell carcinoma of skin of scalp and neck: Secondary | ICD-10-CM | POA: Diagnosis not present

## 2018-02-08 DIAGNOSIS — L280 Lichen simplex chronicus: Secondary | ICD-10-CM | POA: Diagnosis not present

## 2018-02-08 DIAGNOSIS — L812 Freckles: Secondary | ICD-10-CM | POA: Diagnosis not present

## 2018-02-08 DIAGNOSIS — D485 Neoplasm of uncertain behavior of skin: Secondary | ICD-10-CM | POA: Diagnosis not present

## 2018-02-08 DIAGNOSIS — L821 Other seborrheic keratosis: Secondary | ICD-10-CM | POA: Diagnosis not present

## 2018-02-08 DIAGNOSIS — Z85828 Personal history of other malignant neoplasm of skin: Secondary | ICD-10-CM | POA: Diagnosis not present

## 2018-02-08 DIAGNOSIS — L570 Actinic keratosis: Secondary | ICD-10-CM | POA: Diagnosis not present

## 2018-02-15 DIAGNOSIS — H02112 Cicatricial ectropion of right lower eyelid: Secondary | ICD-10-CM | POA: Diagnosis not present

## 2018-02-15 DIAGNOSIS — H2513 Age-related nuclear cataract, bilateral: Secondary | ICD-10-CM | POA: Diagnosis not present

## 2018-02-15 DIAGNOSIS — H401131 Primary open-angle glaucoma, bilateral, mild stage: Secondary | ICD-10-CM | POA: Diagnosis not present

## 2018-02-15 DIAGNOSIS — H25013 Cortical age-related cataract, bilateral: Secondary | ICD-10-CM | POA: Diagnosis not present

## 2018-05-04 ENCOUNTER — Inpatient Hospital Stay: Payer: Medicare HMO | Attending: Hematology & Oncology | Admitting: Hematology & Oncology

## 2018-05-04 ENCOUNTER — Encounter: Payer: Self-pay | Admitting: Hematology & Oncology

## 2018-05-04 ENCOUNTER — Telehealth: Payer: Self-pay | Admitting: Hematology & Oncology

## 2018-05-04 ENCOUNTER — Inpatient Hospital Stay: Payer: Medicare HMO

## 2018-05-04 ENCOUNTER — Other Ambulatory Visit: Payer: Self-pay

## 2018-05-04 VITALS — BP 160/76 | HR 66 | Temp 97.6°F | Resp 18 | Wt 153.0 lb

## 2018-05-04 DIAGNOSIS — Z923 Personal history of irradiation: Secondary | ICD-10-CM | POA: Diagnosis not present

## 2018-05-04 DIAGNOSIS — E782 Mixed hyperlipidemia: Secondary | ICD-10-CM | POA: Insufficient documentation

## 2018-05-04 DIAGNOSIS — Z85048 Personal history of other malignant neoplasm of rectum, rectosigmoid junction, and anus: Secondary | ICD-10-CM | POA: Diagnosis not present

## 2018-05-04 DIAGNOSIS — C20 Malignant neoplasm of rectum: Secondary | ICD-10-CM

## 2018-05-04 DIAGNOSIS — Z9221 Personal history of antineoplastic chemotherapy: Secondary | ICD-10-CM | POA: Diagnosis not present

## 2018-05-04 LAB — CBC WITH DIFFERENTIAL (CANCER CENTER ONLY)
Abs Immature Granulocytes: 0.03 10*3/uL (ref 0.00–0.07)
Basophils Absolute: 0.1 10*3/uL (ref 0.0–0.1)
Basophils Relative: 1 %
Eosinophils Absolute: 0.3 10*3/uL (ref 0.0–0.5)
Eosinophils Relative: 6 %
HCT: 42.9 % (ref 39.0–52.0)
Hemoglobin: 14.4 g/dL (ref 13.0–17.0)
Immature Granulocytes: 1 %
Lymphocytes Relative: 16 %
Lymphs Abs: 0.9 10*3/uL (ref 0.7–4.0)
MCH: 31.2 pg (ref 26.0–34.0)
MCHC: 33.6 g/dL (ref 30.0–36.0)
MCV: 92.9 fL (ref 80.0–100.0)
Monocytes Absolute: 0.5 10*3/uL (ref 0.1–1.0)
Monocytes Relative: 9 %
Neutro Abs: 3.8 10*3/uL (ref 1.7–7.7)
Neutrophils Relative %: 67 %
Platelet Count: 178 10*3/uL (ref 150–400)
RBC: 4.62 MIL/uL (ref 4.22–5.81)
RDW: 11.7 % (ref 11.5–15.5)
WBC Count: 5.6 10*3/uL (ref 4.0–10.5)
nRBC: 0 % (ref 0.0–0.2)

## 2018-05-04 LAB — CMP (CANCER CENTER ONLY)
ALT: 46 U/L — ABNORMAL HIGH (ref 0–44)
AST: 32 U/L (ref 15–41)
Albumin: 4.2 g/dL (ref 3.5–5.0)
Alkaline Phosphatase: 61 U/L (ref 38–126)
Anion gap: 8 (ref 5–15)
BUN: 14 mg/dL (ref 8–23)
CO2: 28 mmol/L (ref 22–32)
Calcium: 9.2 mg/dL (ref 8.9–10.3)
Chloride: 98 mmol/L (ref 98–111)
Creatinine: 0.99 mg/dL (ref 0.61–1.24)
GFR, Est AFR Am: >60 mL/min (ref >60)
GFR, Estimated: >60 mL/min (ref >60)
Glucose, Bld: 103 mg/dL — ABNORMAL HIGH (ref 70–99)
Potassium: 3.7 mmol/L (ref 3.5–5.1)
Sodium: 134 mmol/L — ABNORMAL LOW (ref 135–145)
Total Bilirubin: 0.6 mg/dL (ref 0.3–1.2)
Total Protein: 7.3 g/dL (ref 6.5–8.1)

## 2018-05-04 LAB — LIPID PANEL
CHOL/HDL RATIO: 3.2 ratio
Cholesterol: 193 mg/dL (ref 0–200)
HDL: 61 mg/dL (ref >40)
LDL Cholesterol: 121 mg/dL — ABNORMAL HIGH (ref 0–99)
TRIGLYCERIDES: 57 mg/dL (ref <150)
VLDL: 11 mg/dL (ref 0–40)

## 2018-05-04 LAB — CEA (IN HOUSE-CHCC): CEA (CHCC-In House): 1.58 ng/mL (ref 0.00–5.00)

## 2018-05-04 NOTE — Telephone Encounter (Signed)
Appointments scheduled LMVM with date/time/letter/calendar mailed per 1/3 los

## 2018-05-04 NOTE — Progress Notes (Signed)
Hematology and Oncology Follow Up Visit  [name] [id number] 1930/05/07 85 y.o. 05/04/2018   Principle Diagnosis:   Stage II (T3N0M0) rectal cancer  Current Therapy:    Observation     Interim History:  Mr.  [name] is back for follow-up.  As always, I see him yearly.  He likes to come back to see [place] once a year.  He just feels very confident with our examinations.  He has had no problems since we last saw him.  He has had no issues with his medicines.  He had no change in medications.  His last CEA from 2019 was 1.09.  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  I suppose people just or not eating beef anymore.  I myself, enjoy eating a ton of beef.  As always, we talked about the knuckle head millenials taken who do not believe in eating meat.  He still has some postherpetic neuralgia.  He has had no bleeding.  He has had no change in bowel or bladder habits.  He has had no cough or shortness of breath.  There has s been no leg swelling.  He says he might need cataract surgery.  He wants to try to hold off on this as long as possible.  Overall, his performance status is ECOG 0.  Medications:  Current Outpatient Medications:  .  albuterol (PROAIR HFA) 108 (90 Base) MCG/ACT inhaler, 2 puffs every 4 hours as needed only  if your can't catch your breath, Disp: 1 Inhaler, Rfl: 1 .  betamethasone dipropionate (DIPROLENE) 0.05 % cream, Apply 1 application topically as needed., Disp: , Rfl:  .  budesonide-formoterol (SYMBICORT) 160-4.5 MCG/ACT inhaler, Inhale 2 puffs into the lungs 2 (two) times daily., Disp: 1 Inhaler, Rfl: 0 .  latanoprost (XALATAN) 0.005 % ophthalmic solution, Place 1 drop into both eyes at bedtime., Disp: , Rfl:  .  valsartan-hydrochlorothiazide (DIOVAN HCT) 160-25 MG tablet, Take 1 tablet by mouth daily., Disp: 30 tablet, Rfl: 11  Allergies: No Known Allergies  Past Medical History, Surgical history, Social history, and  Family History were reviewed and updated.  Review of Systems: Review of Systems  Constitutional: Negative.   HENT: Negative.   Eyes: Positive for blurred vision and redness.  Respiratory: Negative.   Cardiovascular: Negative.   Gastrointestinal: Negative.   Genitourinary: Negative.   Musculoskeletal: Negative.   Skin: Negative.   Neurological: Negative.   Endo/Heme/Allergies: Negative.   Psychiatric/Behavioral: Negative.     Physical Exam:  weight is 153 lb (69.4 kg). His oral temperature is 97.6 F (36.4 C). His blood pressure is 160/76 (abnormal) and his pulse is 66. His respiration is 18 and oxygen saturation is 100%.   Physical Exam Vitals signs reviewed.  HENT:     Head: Normocephalic and atraumatic.  Eyes:     Pupils: Pupils are equal, round, and reactive to light.  Neck:     Musculoskeletal: Normal range of motion.  Cardiovascular:     Rate and Rhythm: Normal rate and regular rhythm.     Heart sounds: Normal heart sounds.  Pulmonary:     Effort: Pulmonary effort is normal.     Breath sounds: Normal breath sounds.  Abdominal:     General: Bowel sounds are normal.     Palpations: Abdomen is soft.  Musculoskeletal: Normal range of motion.        General: No tenderness or deformity.  Lymphadenopathy:  Cervical: No cervical adenopathy.  Skin:    General: Skin is warm and dry.     Findings: No erythema or rash.  Neurological:     Mental Status: He is alert and oriented to person, place, and time.  Psychiatric:        Behavior: Behavior normal.        Thought Content: Thought content normal.        Judgment: Judgment normal.      Lab Results  Component Value Date   WBC 5.6 05/04/2018   HGB 14.4 05/04/2018   HCT 42.9 05/04/2018   MCV 92.9 05/04/2018   PLT 178 05/04/2018     Chemistry      Component Value Date/Time   NA 134 (L) 05/04/2018 0744   NA 139 08/10/2017   NA 142 05/04/2017 0741   NA 140 05/05/2016 0747   K 3.7 05/04/2018 0744   K 4.1  05/04/2017 0741   K 4.1 05/05/2016 0747   CL 98 05/04/2018 0744   CL 99 05/04/2017 0741   CO2 28 05/04/2018 0744   CO2 29 05/04/2017 0741   CO2 27 05/05/2016 0747   BUN 14 05/04/2018 0744   BUN 14 08/10/2017   BUN 13 05/04/2017 0741   BUN 20.5 05/05/2016 0747   CREATININE 0.99 05/04/2018 0744   CREATININE 0.8 05/04/2017 0741   CREATININE 1.0 05/05/2016 0747   GLU 91 08/10/2017      Component Value Date/Time   CALCIUM 9.2 05/04/2018 0744   CALCIUM 9.7 05/04/2017 0741   CALCIUM 10.2 05/05/2016 0747   ALKPHOS 61 05/04/2018 0744   ALKPHOS 62 05/04/2017 0741   ALKPHOS 79 05/05/2016 0747   AST 32 05/04/2018 0744   AST 30 05/05/2016 0747   ALT 46 (H) 05/04/2018 0744   ALT 36 05/04/2017 0741   ALT 46 05/05/2016 0747   BILITOT 0.6 05/04/2018 0744   BILITOT 0.74 05/05/2016 0747         Impression and Plan: Mr. Sawatzky is 85 year old gentleman with a remote history of rectal cancer. He has stage II disease. He had neoadjuvant chemotherapy and radiation therapy.  He is clearly cured of this. However, he likes to come back to see [place] yearly. He just gets "peace of mind" with, to see [place].  As always, will see him back in 1 year.  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  Volanda Napoleon, MD 1/3/20208:29 AM

## 2018-05-07 ENCOUNTER — Ambulatory Visit: Payer: Medicare HMO | Admitting: Internal Medicine

## 2018-05-07 ENCOUNTER — Encounter: Payer: Self-pay | Admitting: Internal Medicine

## 2018-05-07 VITALS — BP 158/72 | HR 65 | Wt 152.6 lb

## 2018-05-07 DIAGNOSIS — J4540 Moderate persistent asthma, uncomplicated: Secondary | ICD-10-CM | POA: Diagnosis not present

## 2018-05-07 MED ORDER — BUDESONIDE-FORMOTEROL FUMARATE 160-4.5 MCG/ACT IN AERO: 2.0000 | INHALATION_SPRAY | Freq: Two times a day (BID) | RESPIRATORY_TRACT | 0 refills | Status: DC

## 2018-05-07 NOTE — Patient Instructions (Addendum)
Work on inhaler technique:  relax and gently blow all the way out then take a nice smooth deep breath back in, triggering the inhaler at same time you start breathing in.  Hold for up to 5 seconds if you can. Blow out thru nose. Rinse and gargle with water when done      Only use your albuterol as a rescue medication to be used if you can't catch your breath by resting or doing a relaxed purse lip breathing pattern.  - The less you use it, the better it will work when you need it. - Ok to use up to 2 puffs  every 4 hours if you must but call for immediate appointment if use goes up over your usual need - Don't leave home without it !!  (think of it like the spare tire for your car)     If you are satisfied with your treatment plan,  let your doctor know and he/she can either refill your medications or you can return here when your prescription runs out.     If in any way you are not 100% satisfied,  please tell us.  If 100% better, tell your friends!  Pulmonary follow up is as needed

## 2018-05-07 NOTE — Progress Notes (Signed)
Subjective:     Patient ID: [name], male   DOB: [birth year],    MRN: [id number]    Brief patient profile:  72 yowm never smoker with hx suggestive of longstanding asthma and  h/o cough attributed to to gerd and cc  variable sob/wheeze  for which inhalers seem  to help referred to pulmonary clinic 09/14/2017 by Dr Arelia Sneddon with worse noct wheeze ? since starting ACEi and refractory to symb  160 dosed at 2bid though baseline hfa technique suboptimal.     History of Present Illness  09/14/2017 1st [facility] office visit/ [name]   Chief Complaint  Patient presents with  . Consult    SOB with excertion. wheezing at night.   doe x decades worse with fence post splitting and rapid walking assoc with noct wheeze while on ACEi and better with  use of Symbicort esp at bedtime but also used with "wheezing"  Am of ov (w/in 12 hours of last symb 160  dose)  rec Symbicort 160 can be used up to 2 puffs every 12 hours Work on inhaler technique:   Stop lisinopril -hctz and start diovan 160-25 mg daily - call if problems  Please schedule a follow up office visit in 6 weeks, call sooner if needed with all medications /inhalers/ solutions in hand so we can verify exactly what you are taking. This includes all medications from all doctors and over the counters  - PFTs on return    10/30/2017  f/u ov/[name] re:  Moderate asthma  Chief Complaint  Patient presents with  . Follow-up    Pt has SOB with exertion when really active, doing well overall.   Dyspnea: better when use symbicort 160  Cough: none SABA use: none rec Continue symbiocrt 160 Take 2 puffs first thing in am and then another 2 puffs about 12 hours later.  Work on inhaler technique:   Please schedule a follow up visit in 3 months but call sooner if needed   02/01/2018  f/u ov/[name] re: mod asthma  Chief Complaint  Patient presents with  . Follow-up    Breathing is much improved. He does not have a rescue inhaler.   Dyspnea:   Not limited by breathing from desired activities   Cough: no Sleeping: electric up 30 degrees - originally started using for wife and stayed that way p she died  SABA use: none   05/23/18  f/u ov/[name] re: mod asthma  Chief Complaint  Patient presents with  . 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   Sleeping as above  without nocturnal  or early am exacerbation  of respiratory  c/o's or need for noct saba. Also denies any obvious fluctuation of symptoms with weather or environmental changes or other aggravating or alleviating factors except as outlined above   No unusual exposure hx or h/o childhood pna  or knowledge of premature birth.  Current Allergies, Complete Past Medical History, Past Surgical History, Family History, and Social History were reviewed in [employer] record.  ROS  The following are not active complaints unless bolded Hoarseness,  sore throat, dysphagia, dental problems, itching, sneezing,  nasal congestion or discharge of excess mucus or purulent secretions, ear ache,   fever, chills, sweats, unintended wt loss or wt gain, classically pleuritic or exertional cp,  orthopnea pnd or arm/hand swelling  or leg swelling, presyncope, palpitations, abdominal pain, anorexia, nausea, vomiting, diarrhea  or change in bowel habits or change in bladder habits, change in stools or change in urine, dysuria, hematuria,  rash, arthralgias, visual complaints, headache, numbness, weakness or ataxia or problems with walking or coordination,  change in mood or  memory.        Current Meds  Medication Sig  . albuterol  (PROAIR HFA) 108 (90 Base) MCG/ACT inhaler 2 puffs every 4 hours as needed only  if your can't catch your breath  . betamethasone dipropionate (DIPROLENE) 0.05 % cream Apply 1 application topically as needed.  . budesonide-formoterol (SYMBICORT) 160-4.5 MCG/ACT inhaler Inhale 2 puffs into the lungs 2 (two) times daily.  Marland Kitchen latanoprost (XALATAN) 0.005 % ophthalmic solution Place 1 drop into both eyes at bedtime.  . valsartan-hydrochlorothiazide (DIOVAN HCT) 160-25 MG tablet Take 1 tablet by mouth daily.                   Objective:   Physical Exam    amb wm all smiles today   05/07/2018          152 02/01/2018        148   10/30/2017         143   09/14/17 145 lb 9.6 oz (66 kg)  05/04/17 139 lb 8 oz (63.3 kg)  05/05/16 144 lb (65.3 kg)    Vital signs reviewed - Note on arrival 02 sats  95% on RA      HEENT: nl dentition / oropharynx. Nl external ear canals without cough reflex -  Mild bilateral non-specific turbinate edema     NECK :  without JVD/Nodes/TM/ nl carotid upstrokes bilaterally   LUNGS: no acc muscle use,  Mild barrel  contour chest wall with bilateral  Distant bs / min wheeze with fvc   without cough on insp or exp maneuver and mild  Hyperresonant  to  percussion bilaterally     CV:  RRR  no s3 or murmur or increase in P2, and no edema   ABD:  soft and nontender with pos end  insp Hoover's  in the supine position. No bruits or organomegaly appreciated, bowel sounds nl  MS:   Nl gait/  ext warm without deformities, calf tenderness, cyanosis or clubbing No obvious joint restrictions   SKIN: warm and dry without lesions    NEURO:  alert, approp, nl sensorium with  no motor or cerebellar deficits apparent.             Assessment:

## 2018-05-09 ENCOUNTER — Encounter: Payer: Self-pay | Admitting: Internal Medicine

## 2018-05-09 NOTE — Assessment & Plan Note (Addendum)
PFT's  10/06/09   FEV1 2.56 (100 % ) ratio 51  p no % improvement from saba p ? prior to study with DLCO  116%  - PFT's  10/30/2017  FEV1 1.80 (39 % ) ratio 55   p 39 % improvement from saba p nothing prior to study with DLCO  72 % corrects to 89 % for alv volume   - 10/30/2017  symbicort 160   2 bid - 02/01/2018  After extensive coaching inhaler device,  effectiveness =    75% from baseline 25% (very late insp p trigger, short Ti) - Spirometry 02/01/2018  FEV1 1.3 (59%)  Ratio 56 p am symb 160 with poor hfa   - Allergy profile 02/01/2018 >  Eos 0.3/  IgE  35 RAST neg   - Alpha one AT rec 05/07/2018  > declined    - The proper method of use, as well as anticipated side effects, of a metered-dose inhaler are discussed and demonstrated to the patient. Improved effectiveness after extensive coaching during this visit to a level of approximately 75 % from a baseline of 90 % > ok to continue hfa rx   >>> pulmonary f/u can be yearly but if PCP willing to refill symb 160 can see him prn     I had an extended discussion with the patient reviewing all relevant studies completed to date and  lasting 10 minutes of a 15 minute visit     Work on inhaler technique:  relax and gently blow all the way out then take a nice smooth deep breath back in, triggering the inhaler at same time you start breathing in.  Hold for up to 5 seconds if you can. Blow out thru nose. Rinse and gargle with water when done      Each maintenance medication was reviewed in detail including most importantly the difference between maintenance and prns and under what circumstances the prns are to be triggered using an action plan format that is not reflected in the computer generated alphabetically organized AVS.     Please see AVS for specific instructions unique to this visit that I personally wrote and verbalized to the the pt in detail and then reviewed with pt  by my nurse highlighting any  changes in therapy recommended at today's visit  to their plan of care.

## 2018-05-30 NOTE — Progress Notes (Signed)
Subjective:    Patient ID: [name], male    DOB: 07-13-1930, 85 y.o.   MRN: [id number]  HPI: 12/19/17 OV:  [name] is here to establish as a new pt.  He is a pleasant 85 year old male. PMH: HTN, HLD, Aortic Insufficiency,  Occular Shingles 20 years ago- affected OD Has has portions of R lower eyelid removed, followed by Ophthalmologist every 6 months Rectal CA- Dx'd 1999 Chemo/radiation Surgery 04/09/1998, in remission since then Followed by Oncology/Dr. Marin Olp every Jan  Last CEA level 05/04/2017- 1.09 (normal 0.00-5.00) He denies current GI sx's  April 2019- Transthoracic Echo- Mild LVH with normal wall motion, EF 60% Mild-mod aortic regurg Mild (grade 1) mitral regurg Trace tricuspid/pulmonis regurg Follow-up with Dr. Wynonia Lawman not required per pt. 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 Melvyn Novas is currently treating his asthma and HTN- he would like [place] to take over her antihypertensives and symbicort from pulmonoloy once he is released from practice He denies tobacco use He enjoys 2-3 [employer]'s an evening He reports a great support system of local friends and "a lady friend"- that he spends meal times with and occasional trips to the beach  06/06/2018 OV: [name] is here for f/u: HTN, HLD He reports medication compliance, denies SE He remains active- runs operating [employer] He estimates to drink 20-30 oz plain water/day, prefers to hydrate with coffee He was recently seen by Dr. Corrie Mckusick- 05/07/2018- reviewed inhaler use techniques, and when to use Rx, f/u PRN He was recently seen by Dr. Edrick Kins, re: rectal ca-medications reviewed,  "He is clearly cured of this. However, he likes to come back to see [place] yearly. He just gets "peace of mind" with, to see [place]" First BP in office slightly above goal, repeat much better and home readings  at goal- SBP 120-140, mean 130 DBP 70-80, mean 75 He denies CP/dyspnea/dizziness/HA/palpitations  Recent Lipid Panel- 13/2020 Tot 193 TGs 57 HDL 61 LDL 121 CMP- stable, GFR >60, slight bump in ALT 46   We discussed the risk/benefits of statin therapy, he declined starting- prefers to continues to remain as active as possible and eating a diet low in saturated fat He continues to abstain from tobacco/vape use He continues to enjoy 2-3 [place] in afternoon  Patient Care Team    Relationship Specialty Notifications Start End  Mina Marble D, NP PCP - General Family Medicine  12/19/17   Leonard Downing, MD Referring Physician Family Medicine  12/19/17   Tanda Rockers, MD Consulting Physician Pulmonary Disease  12/19/17   Griselda Miner, MD Consulting Physician Dermatology  12/19/17   Ramonita Lab, Jovita Kussmaul, MD Referring Physician Ophthalmology  12/19/17     Patient Active Problem List   Diagnosis Date Noted  . Healthcare maintenance 06/06/2018  . Elevated LDL cholesterol level 06/06/2018  . Aortic insufficiency 09/15/2017  . Chronic asthma, moderate persistent, uncomplicated 93/26/7124  . Hyperlipidemia, mixed 05/06/2015  . Rectal cancer ([place]) 05/12/2011  . Essential hypertension 08/25/2009  . ASTHMA, UNSPECIFIED 08/25/2009  . DYSPNEA 08/25/2009     Past Medical History:  Diagnosis Date  . Asthma   . Depression    wife died 2 months ago. 05/27/2011  . Eyelid cancer   . Hyperlipidemia, mixed 05/06/2015  . Hypertension   . Rectal cancer ([place]) 1999  Past Surgical History:  Procedure Laterality Date  . COLON RESECTION  1999  . cyst eyelid  2012  . HAND TUMOR EXCISION  2010   right     Family History  Problem Relation Age of Onset  . Hypertension Father   . Stroke Father   . Suicidality Brother   . Colon cancer Neg Hx   . Rectal cancer Neg Hx      Social History   Substance and Sexual Activity  Drug Use No     Social History    Substance and Sexual Activity  Alcohol Use Yes  . Alcohol/week: 16.0 standard drinks  . Types: 16 Cans of beer per week     Social History   Tobacco Use  Smoking Status Never Smoker  Smokeless Tobacco Never Used  Tobacco Comment   Never Used Tobacco     Outpatient Encounter Medications as of 06/06/2018  Medication Sig  . betamethasone dipropionate (DIPROLENE) 0.05 % cream Apply 1 application topically as needed.  . budesonide-formoterol (SYMBICORT) 160-4.5 MCG/ACT inhaler Inhale 2 puffs into the lungs 2 (two) times daily.  Marland Kitchen latanoprost (XALATAN) 0.005 % ophthalmic solution Place 1 drop into both eyes at bedtime.  . valsartan-hydrochlorothiazide (DIOVAN HCT) 160-25 MG tablet Take 1 tablet by mouth daily.  . [DISCONTINUED] valsartan-hydrochlorothiazide (DIOVAN HCT) 160-25 MG tablet Take 1 tablet by mouth daily.  . [DISCONTINUED] albuterol (PROAIR HFA) 108 (90 Base) MCG/ACT inhaler 2 puffs every 4 hours as needed only  if your can't catch your breath  . [DISCONTINUED] budesonide-formoterol (SYMBICORT) 160-4.5 MCG/ACT inhaler Inhale 2 puffs into the lungs 2 (two) times daily.   No facility-administered encounter medications on file as of 06/06/2018.     Allergies: Patient has no known allergies.  Body mass index is 24.87 kg/m.  Blood pressure (!) 144/66, pulse 66, temperature 98.2 F (36.8 C), temperature source Oral, height 5\' 6"  (1.676 m), weight 154 lb 1.6 oz (69.9 kg), SpO2 95 %.  Review of Systems  Constitutional: Negative for activity change, appetite change, chills, diaphoresis, fatigue, fever and unexpected weight change.  Eyes: Negative for visual disturbance.  Respiratory: Negative for cough, chest tightness, shortness of breath, wheezing and stridor.   Cardiovascular: Negative for chest pain, palpitations and leg swelling.  Gastrointestinal: Negative for abdominal distention, abdominal pain, anal bleeding, constipation, diarrhea, nausea and rectal pain.   Endocrine: Negative for cold intolerance, heat intolerance, polydipsia, polyphagia and polyuria.  Genitourinary: Negative for difficulty urinating and flank pain.  Musculoskeletal: Positive for arthralgias and myalgias.  Skin: Negative for color change, pallor, rash and wound.  Neurological: Negative for dizziness and headaches.  Hematological: Does not bruise/bleed easily.  Psychiatric/Behavioral: Negative for behavioral problems, confusion, decreased concentration, dysphoric mood, hallucinations, self-injury, sleep disturbance and suicidal ideas. The patient is not nervous/anxious and is not hyperactive.        Objective:   Physical Exam Vitals signs and nursing note reviewed.  Constitutional:      General: He is not in acute distress.    Appearance: He is well-developed. He is not diaphoretic.  HENT:     Head: Normocephalic and atraumatic.     Right Ear: External ear normal.     Left Ear: External ear normal.     Nose: Nose normal.  Eyes:     Conjunctiva/sclera:     Right eye: Right conjunctiva is injected.     Pupils: Pupils are equal, round, and reactive to light.  Cardiovascular:     Rate and Rhythm: Normal  rate and regular rhythm.     Heart sounds: Normal heart sounds.  Pulmonary:     Effort: Pulmonary effort is normal. No respiratory distress.     Breath sounds: Normal breath sounds. No stridor. No wheezing or rales.  Skin:    General: Skin is warm and dry.     Capillary Refill: Capillary refill takes less than 2 seconds.     Coloration: Skin is not pale.     Findings: No erythema or rash.  Neurological:     Mental Status: He is alert and oriented to person, place, and time.  Psychiatric:        Behavior: Behavior normal.        Thought Content: Thought content normal.        Judgment: Judgment normal.       Assessment & Plan:   1. Healthcare maintenance   2. Essential hypertension   3. Elevated LDL cholesterol level     Healthcare maintenance Continue  all medications as directed. Please check your blood pressure and heart rate several times week and if consistently >150/90 or <100/60 or HR >100 or <60 please call clinic. Try to increase plain water intake to at least 68 oz/day Continue to eat a diet rick in fruits/vegetables/lean protein. Continue regular follow-up with various specialists. Follow-up here in 6 months- Medicare Wellness.  Essential hypertension First BP in office slightly above goal, repeat much better and home readings at goal- SBP 120-140, mean 130 DBP 70-80, mean 75 Repeat BP in office at goal 144/66, HR 66 Continue Diovan 160/25mg  Will not add CCB, due to risk of dropping BP at home- believe his consistently elevated BPs at various providers is due to white coat syndrome and pressure readings taken after his 2-3 cups coffee/day He reports his home readings are taken prior to caffeine intake  Elevated LDL cholesterol level Recent Lipid Panel- 13/2020 Tot 193 TGs 57 HDL 61 LDL 121 We discussed the risk/benefits of statin therapy, he declined starting- prefers to continues to remain as active as possible and eating a diet low in saturated fat He continues to abstain from tobacco/vape use    FOLLOW-UP:  Return in about 6 months (around 12/05/2018) for Medicare Wellness.

## 2018-06-06 ENCOUNTER — Encounter: Payer: Self-pay | Admitting: Adult Health

## 2018-06-06 ENCOUNTER — Ambulatory Visit (INDEPENDENT_AMBULATORY_CARE_PROVIDER_SITE_OTHER): Payer: Medicare HMO | Admitting: Adult Health

## 2018-06-06 VITALS — BP 144/66 | HR 66 | Temp 98.2°F | Ht 66.0 in | Wt 154.1 lb

## 2018-06-06 DIAGNOSIS — Z0000 Encounter for general adult medical examination without abnormal findings: Secondary | ICD-10-CM | POA: Diagnosis not present

## 2018-06-06 DIAGNOSIS — I10 Essential (primary) hypertension: Secondary | ICD-10-CM

## 2018-06-06 DIAGNOSIS — E7800 Pure hypercholesterolemia, unspecified: Secondary | ICD-10-CM | POA: Diagnosis not present

## 2018-06-06 MED ORDER — VALSARTAN-HYDROCHLOROTHIAZIDE 160-25 MG PO TABS: 1.0000 | ORAL_TABLET | Freq: Every day | ORAL | 1 refills | Status: DC

## 2018-06-06 NOTE — Patient Instructions (Addendum)
Managing Your Hypertension Hypertension is commonly called high blood pressure. This is when the force of your blood pressing against the walls of your arteries is too strong. Arteries are blood vessels that carry blood from your heart throughout your body. Hypertension forces the heart to work harder to pump blood, and may cause the arteries to become narrow or stiff. Having untreated or uncontrolled hypertension can cause heart attack, stroke, kidney disease, and other problems. What are blood pressure readings? A blood pressure reading consists of a higher number over a lower number. Ideally, your blood pressure should be below 120/80. The first ("top") number is called the systolic pressure. It is a measure of the pressure in your arteries as your heart beats. The second ("bottom") number is called the diastolic pressure. It is a measure of the pressure in your arteries as the heart relaxes. What does my blood pressure reading mean? Blood pressure is classified into four stages. Based on your blood pressure reading, your health care provider may use the following stages to determine what type of treatment you need, if any. Systolic pressure and diastolic pressure are measured in a unit called mm Hg. Normal  Systolic pressure: below 120.  Diastolic pressure: below 80. Elevated  Systolic pressure: 120-129.  Diastolic pressure: below 80. Hypertension stage 1  Systolic pressure: 130-139.  Diastolic pressure: 80-89. Hypertension stage 2  Systolic pressure: 140 or above.  Diastolic pressure: 90 or above. What health risks are associated with hypertension? Managing your hypertension is an important responsibility. Uncontrolled hypertension can lead to:  A heart attack.  A stroke.  A weakened blood vessel (aneurysm).  Heart failure.  Kidney damage.  Eye damage.  Metabolic syndrome.  Memory and concentration problems. What changes can I make to manage my  hypertension? Hypertension can be managed by making lifestyle changes and possibly by taking medicines. Your health care provider will help you make a plan to bring your blood pressure within a normal range. Eating and drinking   Eat a diet that is high in fiber and potassium, and low in salt (sodium), added sugar, and fat. An example eating plan is called the DASH (Dietary Approaches to Stop Hypertension) diet. To eat this way: ? Eat plenty of fresh fruits and vegetables. Try to fill half of your plate at each meal with fruits and vegetables. ? Eat whole grains, such as whole wheat pasta, brown rice, or whole grain bread. Fill about one quarter of your plate with whole grains. ? Eat low-fat diary products. ? Avoid fatty cuts of meat, processed or cured meats, and poultry with skin. Fill about one quarter of your plate with lean proteins such as fish, chicken without skin, beans, eggs, and tofu. ? Avoid premade and processed foods. These tend to be higher in sodium, added sugar, and fat.  Reduce your daily sodium intake. Most people with hypertension should eat less than 1,500 mg of sodium a day.  Limit alcohol intake to no more than 1 drink a day for nonpregnant women and 2 drinks a day for men. One drink equals 12 oz of beer, 5 oz of wine, or 1 oz of hard liquor. Lifestyle  Work with your health care provider to maintain a healthy body weight, or to lose weight. Ask what an ideal weight is for you.  Get at least 30 minutes of exercise that causes your heart to beat faster (aerobic exercise) most days of the week. Activities may include walking, swimming, or biking.  Include exercise   to strengthen your muscles (resistance exercise), such as weight lifting, as part of your weekly exercise routine. Try to do these types of exercises for 30 minutes at least 3 days a week.  Do not use any products that contain nicotine or tobacco, such as cigarettes and e-cigarettes. If you need help quitting,  ask your health care provider.  Control any long-term (chronic) conditions you have, such as high cholesterol or diabetes. Monitoring  Monitor your blood pressure at home as told by your health care provider. Your personal target blood pressure may vary depending on your medical conditions, your age, and other factors.  Have your blood pressure checked regularly, as often as told by your health care provider. Working with your health care provider  Review all the medicines you take with your health care provider because there may be side effects or interactions.  Talk with your health care provider about your diet, exercise habits, and other lifestyle factors that may be contributing to hypertension.  Visit your health care provider regularly. Your health care provider can help you create and adjust your plan for managing hypertension. Will I need medicine to control my blood pressure? Your health care provider may prescribe medicine if lifestyle changes are not enough to get your blood pressure under control, and if:  Your systolic blood pressure is 130 or higher.  Your diastolic blood pressure is 80 or higher. Take medicines only as told by your health care provider. Follow the directions carefully. Blood pressure medicines must be taken as prescribed. The medicine does not work as well when you skip doses. Skipping doses also puts you at risk for problems. Contact a health care provider if:  You think you are having a reaction to medicines you have taken.  You have repeated (recurrent) headaches.  You feel dizzy.  You have swelling in your ankles.  You have trouble with your vision. Get help right away if:  You develop a severe headache or confusion.  You have unusual weakness or numbness, or you feel faint.  You have severe pain in your chest or abdomen.  You vomit repeatedly.  You have trouble breathing. Summary  Hypertension is when the force of blood pumping  through your arteries is too strong. If this condition is not controlled, it may put you at risk for serious complications.  Your personal target blood pressure may vary depending on your medical conditions, your age, and other factors. For most people, a normal blood pressure is less than 120/80.  Hypertension is managed by lifestyle changes, medicines, or both. Lifestyle changes include weight loss, eating a healthy, low-sodium diet, exercising more, and limiting alcohol. This information is not intended to replace advice given to you by your health care provider. Make sure you discuss any questions you have with your health care provider. Document Released: 01/11/2012 Document Revised: 03/16/2016 Document Reviewed: 03/16/2016 Elsevier Interactive Patient Education  2019 [place] all medications as directed. Please check your blood pressure and heart rate several times week and if consistently >150/90 or <100/60 or HR >100 or <60 please call clinic. Try to increase plain water intake to at least 68 oz/day Continue to eat a diet rick in fruits/vegetables/lean protein. Continue regular follow-up with various specialists. Follow-up here in 6 months- Medicare Wellness. GREAT TO SEE YOU!

## 2018-06-06 NOTE — Assessment & Plan Note (Signed)
Recent Lipid Panel- 13/2020 Tot 193 TGs 57 HDL 61 LDL 121 We discussed the risk/benefits of statin therapy, he declined starting- prefers to continues to remain as active as possible and eating a diet low in saturated fat He continues to abstain from tobacco/vape use

## 2018-06-06 NOTE — Assessment & Plan Note (Addendum)
First BP in office slightly above goal, repeat much better and home readings at goal- SBP 120-140, mean 130 DBP 70-80, mean 75 Repeat BP in office at goal 144/66, HR 66 Continue Diovan 160/25mg  Will not add CCB, due to risk of dropping BP at home- believe his consistently elevated BPs at various providers is due to white coat syndrome and pressure readings taken after his 2-3 cups coffee/day He reports his home readings are taken prior to caffeine intake

## 2018-06-06 NOTE — Assessment & Plan Note (Signed)
Continue all medications as directed. Please check your blood pressure and heart rate several times week and if consistently >150/90 or <100/60 or HR >100 or <60 please call clinic. Try to increase plain water intake to at least 68 oz/day Continue to eat a diet rick in fruits/vegetables/lean protein. Continue regular follow-up with various specialists. Follow-up here in 6 months- Medicare Wellness.

## 2018-07-16 ENCOUNTER — Ambulatory Visit (INDEPENDENT_AMBULATORY_CARE_PROVIDER_SITE_OTHER): Payer: Medicare HMO | Admitting: Adult Health

## 2018-07-16 ENCOUNTER — Encounter: Payer: Self-pay | Admitting: Adult Health

## 2018-07-16 ENCOUNTER — Other Ambulatory Visit: Payer: Self-pay

## 2018-07-16 VITALS — BP 129/79 | HR 83 | Temp 98.1°F | Ht 66.0 in | Wt 155.1 lb

## 2018-07-16 DIAGNOSIS — J40 Bronchitis, not specified as acute or chronic: Secondary | ICD-10-CM | POA: Diagnosis not present

## 2018-07-16 DIAGNOSIS — J4540 Moderate persistent asthma, uncomplicated: Secondary | ICD-10-CM | POA: Diagnosis not present

## 2018-07-16 MED ORDER — AZITHROMYCIN 250 MG PO TABS: ORAL_TABLET | ORAL | 0 refills | Status: DC

## 2018-07-16 MED ORDER — ALBUTEROL SULFATE HFA 108 (90 BASE) MCG/ACT IN AERS: 2.0000 | INHALATION_SPRAY | Freq: Four times a day (QID) | RESPIRATORY_TRACT | 0 refills | Status: DC | PRN

## 2018-07-16 MED ORDER — IPRATROPIUM-ALBUTEROL 0.5-2.5 (3) MG/3ML IN SOLN
3.0000 mL | Freq: Four times a day (QID) | RESPIRATORY_TRACT | Status: DC
  Administered 2018-07-16: 3 mL via RESPIRATORY_TRACT

## 2018-07-16 NOTE — Assessment & Plan Note (Addendum)
RUL/ RML wheezing cleared with in-clinic breathing tx- pt tolerated well Please take Azithromycin as directed. Please use ProAir as needed for wheezing, chest tightness. Increase plain water intake. If symptoms do not improve after Azithromycin completed, please make appt to be re-evaluated.

## 2018-07-16 NOTE — Progress Notes (Signed)
Subjective:    Patient ID: [name], male    DOB: 06-24-1930, 85 y.o.   MRN: [id number]  HPI:  [name] presents with non-productive cough, intermittent wheezing, and scant clear nasal drainage, that all started 4 days ago after a "day of moving". He denies any recent travel out of the county. He denies fever/night sweats/chillsN/V/D He denies CP/palpitations He has not take any OTC remedies Current temp 98.1 f oral He estimates to only drink 16-20 oz plain water/day, prefers to hydrated with coffee He reports recurrent bronchitis due to lifetime exposure to chemicals and second hand tobacco smoke He has never smoked himself  Patient Care Team    Relationship Specialty Notifications Start End  Mina Marble D, NP PCP - General Family Medicine  12/19/17   Leonard Downing, MD Referring Physician Family Medicine  12/19/17   Tanda Rockers, MD Consulting Physician Pulmonary Disease  12/19/17   Griselda Miner, MD Consulting Physician Dermatology  12/19/17   Ramonita Lab, Jovita Kussmaul, MD Referring Physician Ophthalmology  12/19/17     Patient Active Problem List   Diagnosis Date Noted  . Bronchitis 07/16/2018  . Healthcare maintenance 06/06/2018  . Elevated LDL cholesterol level 06/06/2018  . Aortic insufficiency 09/15/2017  . Chronic asthma, moderate persistent, uncomplicated 00/93/8182  . Hyperlipidemia, mixed 05/06/2015  . Rectal cancer ([place]) 05/12/2011  . Essential hypertension 08/25/2009  . ASTHMA, UNSPECIFIED 08/25/2009  . DYSPNEA 08/25/2009     Past Medical History:  Diagnosis Date  . Asthma   . Depression    wife died 2 months ago. May 18, 2011  . Eyelid cancer   . Hyperlipidemia, mixed 05/06/2015  . Hypertension   . Rectal cancer ([place]) 1999     Past Surgical History:  Procedure Laterality Date  . COLON RESECTION  1999  . cyst eyelid  2012  . HAND TUMOR EXCISION  2010   right     Family History  Problem Relation Age of Onset  .  Hypertension Father   . Stroke Father   . Suicidality Brother   . Colon cancer Neg Hx   . Rectal cancer Neg Hx      Social History   Substance and Sexual Activity  Drug Use No     Social History   Substance and Sexual Activity  Alcohol Use Yes  . Alcohol/week: 16.0 standard drinks  . Types: 16 Cans of beer per week     Social History   Tobacco Use  Smoking Status Never Smoker  Smokeless Tobacco Never Used  Tobacco Comment   Never Used Tobacco     Outpatient Encounter Medications as of 07/16/2018  Medication Sig  . albuterol (PROVENTIL HFA;VENTOLIN HFA) 108 (90 Base) MCG/ACT inhaler Inhale 2 puffs into the lungs every 6 (six) hours as needed for wheezing or shortness of breath.  Marland Kitchen azithromycin (ZITHROMAX) 250 MG tablet 2 tabs day one. 1 tab days two-five  . betamethasone dipropionate (DIPROLENE) 0.05 % cream Apply 1 application topically as needed.  . budesonide-formoterol (SYMBICORT) 160-4.5 MCG/ACT inhaler Inhale 2 puffs into the lungs 2 (two) times daily.  Marland Kitchen latanoprost (XALATAN) 0.005 % ophthalmic solution Place 1 drop into both eyes at bedtime.  . valsartan-hydrochlorothiazide (DIOVAN HCT) 160-25 MG tablet Take 1 tablet by mouth daily.   Facility-Administered Encounter Medications as of 07/16/2018  Medication  . ipratropium-albuterol (DUONEB) 0.5-2.5 (3) MG/3ML nebulizer solution 3 mL    Allergies: Patient has no known allergies.  Body mass index is 25.03 kg/m.  Blood pressure 129/79, pulse 83, temperature 98.1 F (36.7 C), temperature source Oral, height 5\' 6"  (1.676 m), weight 155 lb 1.6 oz (70.4 kg), SpO2 95 %.  Review of Systems  Constitutional: Positive for fatigue. Negative for activity change, appetite change, chills, diaphoresis, fever and unexpected weight change.  HENT: Positive for congestion, postnasal drip and rhinorrhea. Negative for drooling, ear discharge, facial swelling, hearing loss, mouth sores, nosebleeds, sinus pressure, sinus pain,  sneezing, sore throat, trouble swallowing and voice change.   Eyes: Negative for visual disturbance.  Respiratory: Positive for cough and wheezing. Negative for chest tightness, shortness of breath and stridor.   Cardiovascular: Negative for chest pain, palpitations and leg swelling.  Gastrointestinal: Negative for abdominal distention, abdominal pain, blood in stool, constipation, diarrhea, nausea and vomiting.  Endocrine: Negative for cold intolerance, heat intolerance, polydipsia, polyphagia and polyuria.  Genitourinary: Negative for difficulty urinating and flank pain.  Skin: Negative for color change, pallor, rash and wound.  Neurological: Negative for dizziness and headaches.  Hematological: Does not bruise/bleed easily.       Objective:   Physical Exam Vitals signs and nursing note reviewed.  Constitutional:      General: He is not in acute distress.    Appearance: Normal appearance. He is obese. He is not ill-appearing, toxic-appearing or diaphoretic.  HENT:     Head: Normocephalic and atraumatic.     Right Ear: Decreased hearing noted. Tympanic membrane is not erythematous or bulging.     Left Ear: Decreased hearing noted. Tympanic membrane is not erythematous or bulging.     Nose:     Right Turbinates: Swollen.     Left Turbinates: Swollen.     Right Sinus: No maxillary sinus tenderness or frontal sinus tenderness.     Left Sinus: No maxillary sinus tenderness or frontal sinus tenderness.     Mouth/Throat:     Pharynx: Posterior oropharyngeal erythema present. No oropharyngeal exudate.     Tonsils: No tonsillar exudate. Swelling: 0 on the right. 0 on the left.  Eyes:     Conjunctiva/sclera:     Right eye: Right conjunctiva is injected.  Neck:     Musculoskeletal: Normal range of motion.  Cardiovascular:     Rate and Rhythm: Normal rate.     Pulses: Normal pulses.     Heart sounds: Normal heart sounds. No murmur. No friction rub. No gallop.   Pulmonary:     Breath  sounds: Examination of the right-upper field reveals wheezing. Examination of the right-middle field reveals wheezing. Wheezing present. No decreased breath sounds, rhonchi or rales.  Lymphadenopathy:     Cervical: No cervical adenopathy.  Skin:    General: Skin is warm and dry.     Capillary Refill: Capillary refill takes less than 2 seconds.  Neurological:     Mental Status: He is alert and oriented to person, place, and time.  Psychiatric:        Mood and Affect: Mood normal.        Behavior: Behavior normal.        Thought Content: Thought content normal.        Judgment: Judgment normal.       Assessment & Plan:   1. Chronic asthma, moderate persistent, uncomplicated   2. Bronchitis     Bronchitis RUL/ RML wheezing cleared with in-clinic breathing tx- pt tolerated well Please take Azithromycin as directed. Please use ProAir as needed for wheezing, chest tightness. Increase plain water intake. If symptoms do not  improve after Azithromycin completed, please make appt to be re-evaluated.    FOLLOW-UP:  Return if symptoms worsen or fail to improve.

## 2018-07-16 NOTE — Patient Instructions (Signed)
Acute Bronchitis, Adult  Acute bronchitis is sudden (acute) swelling of the air tubes (bronchi) in the lungs. Acute bronchitis causes these tubes to fill with mucus, which can make it hard to breathe. It can also cause coughing or wheezing. In adults, acute bronchitis usually goes away within 2 weeks. A cough caused by bronchitis may last up to 3 weeks. Smoking, allergies, and asthma can make the condition worse. Repeated episodes of bronchitis may cause further lung problems, such as chronic obstructive pulmonary disease (COPD). What are the causes? This condition can be caused by germs and by substances that irritate the lungs, including:  Cold and flu viruses. This condition is most often caused by the same virus that causes a cold.  Bacteria.  Exposure to tobacco smoke, dust, fumes, and air pollution. What increases the risk? This condition is more likely to develop in people who:  Have close contact with someone with acute bronchitis.  Are exposed to lung irritants, such as tobacco smoke, dust, fumes, and vapors.  Have a weak immune system.  Have a respiratory condition such as asthma. What are the signs or symptoms? Symptoms of this condition include:  A cough.  Coughing up clear, yellow, or green mucus.  Wheezing.  Chest congestion.  Shortness of breath.  A fever.  Body aches.  Chills.  A sore throat. How is this diagnosed? This condition is usually diagnosed with a physical exam. During the exam, your health care provider may order tests, such as chest X-rays, to rule out other conditions. He or she may also:  Test a sample of your mucus for bacterial infection.  Check the level of oxygen in your blood. This is done to check for pneumonia.  Do a chest X-ray or lung function testing to rule out pneumonia and other conditions.  Perform blood tests. Your health care provider will also ask about your symptoms and medical history. How is this treated? Most  cases of acute bronchitis clear up over time without treatment. Your health care provider may recommend:  Drinking more fluids. Drinking more makes your mucus thinner, which may make it easier to breathe.  Taking a medicine for a fever or cough.  Taking an antibiotic medicine.  Using an inhaler to help improve shortness of breath and to control a cough.  Using a cool mist vaporizer or humidifier to make it easier to breathe. Follow these instructions at home: Medicines  Take over-the-counter and prescription medicines only as told by your health care provider.  If you were prescribed an antibiotic, take it as told by your health care provider. Do not stop taking the antibiotic even if you start to feel better. General instructions   Get plenty of rest.  Drink enough fluids to keep your urine pale yellow.  Avoid smoking and secondhand smoke. Exposure to cigarette smoke or irritating chemicals will make bronchitis worse. If you smoke and you need help quitting, ask your health care provider. Quitting smoking will help your lungs heal faster.  Use an inhaler, cool mist vaporizer, or humidifier as told by your health care provider.  Keep all follow-up visits as told by your health care provider. This is important. How is this prevented? To lower your risk of getting this condition again:  Wash your hands often with soap and water. If soap and water are not available, use hand sanitizer.  Avoid contact with people who have cold symptoms.  Try not to touch your hands to your mouth, nose, or eyes.    Make sure to get the flu shot every year. Contact a health care provider if:  Your symptoms do not improve in 2 weeks of treatment. Get help right away if:  You cough up blood.  You have chest pain.  You have severe shortness of breath.  You become dehydrated.  You faint or keep feeling like you are going to faint.  You keep vomiting.  You have a severe headache.  Your  fever or chills gets worse. This information is not intended to replace advice given to you by your health care provider. Make sure you discuss any questions you have with your health care provider. Document Released: 05/26/2004 Document Revised: 11/30/2016 Document Reviewed: 10/07/2015 Elsevier Interactive Patient Education  2019 [place] (COVID-19) Are you at risk?  Are you at risk for the Coronavirus (COVID-19)?  To be considered HIGH RISK for Coronavirus (COVID-19), you have to meet the following criteria:  . Traveled to [place], [place], [place], [place] or [place]; or in the [place] to [place], [place], [place], or [place]; and have fever, cough, and shortness of breath within the last 2 weeks of travel OR . Been in close contact with a person diagnosed with COVID-19 within the last 2 weeks and have fever, cough, and shortness of breath . IF YOU DO NOT MEET THESE CRITERIA, YOU ARE CONSIDERED LOW RISK FOR COVID-19.  What to do if you are HIGH RISK for COVID-19?  Marland Kitchen If you are having a medical emergency, call 911. . Seek medical care right away. Before you go to a doctor's office, urgent care or emergency department, call ahead and tell them about your recent travel, contact with someone diagnosed with COVID-19, and your symptoms. You should receive instructions from your physician's office regarding next steps of care.  . When you arrive at healthcare provider, tell the healthcare staff immediately you have returned from visiting [place], [place], [place], [place] or [place]; or traveled in the [place] to [place], [place], [place], or [place]; in the last two weeks or you have been in close contact with a person diagnosed with COVID-19 in the last 2 weeks.   . Tell the health care staff about your symptoms: fever, cough and shortness of breath. . After you have been seen by a medical provider, you will be either: o Tested for (COVID-19) and  discharged home on quarantine except to seek medical care if symptoms worsen, and asked to  - Stay home and avoid contact with others until you get your results (4-5 days)  - Avoid travel on public transportation if possible (such as bus, train, or airplane) or o Sent to the Emergency Department by EMS for evaluation, COVID-19 testing, and possible admission depending on your condition and test results.  What to do if you are LOW RISK for COVID-19?  Reduce your risk of any infection by using the same precautions used for avoiding the common cold or flu:  Marland Kitchen Wash your hands often with soap and warm water for at least 20 seconds.  If soap and water are not readily available, use an alcohol-based hand sanitizer with at least 60% alcohol.  . If coughing or sneezing, cover your mouth and nose by coughing or sneezing into the elbow areas of your shirt or coat, into a tissue or into your sleeve (not your hands). . Avoid shaking hands with others and consider head nods or verbal greetings only. . Avoid touching your eyes,  nose, or mouth with unwashed hands.  . Avoid close contact with people who are sick. . Avoid places or events with large numbers of people in one location, like concerts or sporting events. . Carefully consider travel plans you have or are making. . If you are planning any travel outside or inside the [place], visit the CDC's Travelers' Health webpage for the latest health notices. . If you have some symptoms but not all symptoms, continue to monitor at home and seek medical attention if your symptoms worsen. . If you are having a medical emergency, call 911.   [place] / e-Visit: [email or web address]         [facility]: [phone]  [facility]: [phone]                   [facility]: [phone]   Please take Azithromycin as directed.  Please use ProAir as needed for wheezing, chest tightness. Increase plain water intake. If symptoms do not improve after Azithromycin completed, please make appt to be re-evaluated. FEEL BETTER!

## 2018-09-08 ENCOUNTER — Other Ambulatory Visit: Payer: Self-pay | Admitting: Adult Health

## 2018-09-10 NOTE — Telephone Encounter (Signed)
We have not prescribed these medications for the patient previously.  Please review and refill if appropriate.  T. Nelson, CMA  

## 2018-10-04 DIAGNOSIS — H52223 Regular astigmatism, bilateral: Secondary | ICD-10-CM | POA: Diagnosis not present

## 2018-10-04 DIAGNOSIS — H524 Presbyopia: Secondary | ICD-10-CM | POA: Diagnosis not present

## 2018-10-04 DIAGNOSIS — H5203 Hypermetropia, bilateral: Secondary | ICD-10-CM | POA: Diagnosis not present

## 2018-11-29 ENCOUNTER — Telehealth: Payer: Self-pay | Admitting: Adult Health

## 2018-11-29 NOTE — Telephone Encounter (Signed)
Have pt take 1/2 tab in am and 1/2 tab of his BP med in pm (8 and 8p).   continue to check bp in am 2 hrs after taking his medication for BP and 2 hrs after pm dose.  Note HR as well.   Otherwise., only check BP other times if he develops concerning sx.   -  HE NEEDS f/up appt with Valetta Fuller near future b/c at 85yo- he should be seeing her q 51mo or so anyhow since things can change with our health pretty drastically when we are older like he is.   Bring in log of BP/ HR's then as well as anything else Leonard asked for

## 2018-11-29 NOTE — Telephone Encounter (Signed)
Patient notified. MPulliam, CMA/RT(R)  

## 2018-11-29 NOTE — Telephone Encounter (Signed)
Patient came by office to spk w/ provider regarding his irregular BP-- states that in morning when 1st gets up its running 150/90 seems bottom no#  Fluctuates, then in afternoon it levels out to 120/80s-90s---  ---Forwarding message to medical assistant to call pt @ [phone].  --glh

## 2018-11-29 NOTE — Telephone Encounter (Signed)
Katy patient.   Spoke to patient and he states that BP in the mornings has been running 150/90's and was 171/95 today - this is before taking his BP med that he takes daily in the AM.  Patient states that BP goes down in the afternoon and in the evenings with readings 130-140/80s.  Patient denies any symptoms (chest pain, HA, SOB, dizziness, etc.) Patient last saw Mina Marble, NP 07-16-2018 and is currently taking Valsartan/HCTZ 160-25 mg daily.  Please review and advise. MPulliam, CMA/RT(R)

## 2018-11-30 ENCOUNTER — Other Ambulatory Visit: Payer: Self-pay | Admitting: Adult Health

## 2018-12-05 ENCOUNTER — Ambulatory Visit: Payer: Medicare HMO | Admitting: Adult Health

## 2018-12-09 NOTE — Progress Notes (Signed)
Subjective:    Patient ID: [name], male    DOB: 31-Aug-1930, 85 y.o.   MRN: [id number]  HPI: 12/19/17 OV:  [name] is here to establish as a new pt.  He is a pleasant 85 year old male. PMH: HTN, HLD, Aortic Insufficiency,  Occular Shingles 20 years ago- affected OD Has has portions of R lower eyelid removed, followed by Ophthalmologist every 6 months Rectal CA- Dx'd 1999 Chemo/radiation Surgery 04/09/1998, in remission since then Followed by Oncology/Dr. Marin Olp every Jan  Last CEA level 05/04/2017- 1.09 (normal 0.00-5.00) He denies current GI sx's  April 2019- Transthoracic Echo- Mild LVH with normal wall motion, EF 60% Mild-mod aortic regurg Mild (grade 1) mitral regurg Trace tricuspid/pulmonis regurg Follow-up with Dr. Wynonia Lawman not required per pt. 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 Melvyn Novas is currently treating his asthma and HTN- he would like [place] to take over her antihypertensives and symbicort from pulmonoloy once he is released from practice He denies tobacco use He enjoys 2-3 Sabra Heck Lite's an evening He reports a great support system of local friends and "a lady friend"- that he spends meal times with and occasional trips to the beach  06/06/2018 OV: [name] is here for f/u: HTN, HLD He reports medication compliance, denies SE He remains active- runs operating [employer]! He estimates to drink 20-30 oz plain water/day, prefers to hydrate with coffee He was recently seen by Dr. Corrie Mckusick- 05/07/2018- reviewed inhaler use techniques, and when to use Rx, f/u PRN He was recently seen by Dr. Edrick Kins, re: rectal ca-medications reviewed,  "He is clearly cured of this. However, he likes to come back to see [place] yearly. He just gets "peace of mind" with, to see [place]" First BP in office slightly above goal, repeat much better and home readings  at goal- SBP 120-140, mean 130 DBP 70-80, mean 75 He denies CP/dyspnea/dizziness/HA/palpitations  Recent Lipid Panel- 13/2020 Tot 193 TGs 57 HDL 61 LDL 121 CMP- stable, GFR >60, slight bump in ALT 46   We discussed the risk/benefits of statin therapy, he declined starting- prefers to continues to remain as active as possible and eating a diet low in saturated fat He continues to abstain from tobacco/vape use He continues to enjoy 2-3 [place] in afternoon  12/10/2018 OV: Mr. 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 Bronchitis 07/16/2018  . Healthcare maintenance 06/06/2018  . Elevated LDL cholesterol level 06/06/2018  . Aortic insufficiency 09/15/2017  . Chronic asthma, moderate persistent, uncomplicated 70/96/2836  . Hyperlipidemia, mixed 05/06/2015  . Rectal cancer ([place]) 05/12/2011  . Essential hypertension  08/25/2009  . ASTHMA, UNSPECIFIED 08/25/2009  . DYSPNEA 08/25/2009     Past Medical History:  Diagnosis Date  . Asthma   . Depression    wife died 2 months ago. 2011/05/11  . Eyelid cancer   . Hyperlipidemia, mixed 05/06/2015  . Hypertension   . Rectal cancer ([place]) 1999     Past Surgical History:   Procedure Laterality Date  . COLON RESECTION  1999  . cyst eyelid  2012  . HAND TUMOR EXCISION  2010   right     Family History  Problem Relation Age of Onset  . Hypertension Father   . Stroke Father   . Suicidality Brother   . Colon cancer Neg Hx   . Rectal cancer Neg Hx      Social History   Substance and Sexual Activity  Drug Use No     Social History   Substance and Sexual Activity  Alcohol Use Yes  . Alcohol/week: 16.0 standard drinks  . Types: 16 Cans of beer per week     Social History   Tobacco Use  Smoking Status Never Smoker  Smokeless Tobacco Never Used  Tobacco Comment   Never Used Tobacco     Outpatient Encounter Medications as of 12/10/2018  Medication Sig  . betamethasone dipropionate (DIPROLENE) 0.05 % cream APPLY EVERY 12 HOURS TO AFFECTED AREA FOR PSORIASIS  . budesonide-formoterol (SYMBICORT) 160-4.5 MCG/ACT inhaler Inhale 2 puffs into the lungs 2 (two) times daily.  Marland Kitchen latanoprost (XALATAN) 0.005 % ophthalmic solution Place 1 drop into both eyes at bedtime.  . [DISCONTINUED] valsartan-hydrochlorothiazide (DIOVAN-HCT) 160-25 MG tablet TAKE 1 TABLET BY MOUTH EVERY DAY  . valsartan-hydrochlorothiazide (DIOVAN HCT) 320-12.5 MG tablet Take 1 tablet by mouth daily.  . [DISCONTINUED] albuterol (PROVENTIL HFA;VENTOLIN HFA) 108 (90 Base) MCG/ACT inhaler Inhale 2 puffs into the lungs every 6 (six) hours as needed for wheezing or shortness of breath.  . [DISCONTINUED] azithromycin (ZITHROMAX) 250 MG tablet 2 tabs day one. 1 tab days two-five   Facility-Administered Encounter Medications as of 12/10/2018  Medication  . ipratropium-albuterol (DUONEB) 0.5-2.5 (3) MG/3ML nebulizer solution 3 mL    Allergies: Patient has no known allergies.  Body mass index is 24.55 kg/m.  Blood pressure (!) 155/69, pulse 67, temperature 98.4 F (36.9 C), temperature source Oral, height 5\' 6"  (1.676 m), weight 152 lb 1.6 oz (69 kg), SpO2 95 %.   Review of  Systems  Constitutional: Negative for activity change, appetite change, chills, diaphoresis, fatigue, fever and unexpected weight change.  Eyes: Positive for redness. Negative for visual disturbance.  Respiratory: Negative for cough, chest tightness, shortness of breath, wheezing and stridor.   Cardiovascular: Negative for chest pain, palpitations and leg swelling.  Neurological: Negative for dizziness and headaches.  Hematological: Negative for adenopathy. Does not bruise/bleed easily.       Objective:   Physical Exam Vitals signs and nursing note reviewed.  Constitutional:      General: He is not in acute distress.    Appearance: Normal appearance. He is normal weight. He is not ill-appearing, toxic-appearing or diaphoretic.  HENT:     Head: Normocephalic and atraumatic.  Cardiovascular:     Rate and Rhythm: Normal rate and regular rhythm.     Pulses: Normal pulses.     Heart sounds: Normal heart sounds.  Pulmonary:     Effort: Pulmonary effort is normal. No respiratory distress.     Breath sounds: Normal breath sounds. No stridor. No wheezing, rhonchi or  rales.  Chest:     Chest wall: No tenderness.  Skin:    General: Skin is warm and dry.     Capillary Refill: Capillary refill takes less than 2 seconds.  Neurological:     Mental Status: He is alert and oriented to person, place, and time.  Psychiatric:        Mood and Affect: Mood normal.        Behavior: Behavior normal.        Thought Content: Thought content normal.        Judgment: Judgment normal.        Assessment & Plan:   1. Essential hypertension     Essential hypertension Stop Diovan 160/25mg  and start Diovan 320/12.5mg  once daily- take each morning. Monitor blood pressure and heart rate daily, record. Follow-up in 2 weeks. If your blood pressure is consistently <100/60 or >140/90 please call clinic prior to follow-up. Continue to follow DASH diet. Keep Intel intake to 2 max/day. Continue to  social distance and wear a mask when in public. Follow-up in 2 weeks.    FOLLOW-UP:  No follow-ups on file.

## 2018-12-10 ENCOUNTER — Encounter: Payer: Self-pay | Admitting: Adult Health

## 2018-12-10 ENCOUNTER — Ambulatory Visit (INDEPENDENT_AMBULATORY_CARE_PROVIDER_SITE_OTHER): Payer: Medicare HMO | Admitting: Adult Health

## 2018-12-10 ENCOUNTER — Other Ambulatory Visit: Payer: Self-pay

## 2018-12-10 DIAGNOSIS — I10 Essential (primary) hypertension: Secondary | ICD-10-CM

## 2018-12-10 MED ORDER — VALSARTAN-HYDROCHLOROTHIAZIDE 320-12.5 MG PO TABS: 1.0000 | ORAL_TABLET | Freq: Every day | ORAL | 0 refills | Status: DC

## 2018-12-10 NOTE — Patient Instructions (Addendum)
Managing Your Hypertension Hypertension is commonly called high blood pressure. This is when the force of your blood pressing against the walls of your arteries is too strong. Arteries are blood vessels that carry blood from your heart throughout your body. Hypertension forces the heart to work harder to pump blood, and may cause the arteries to become narrow or stiff. Having untreated or uncontrolled hypertension can cause heart attack, stroke, kidney disease, and other problems. What are blood pressure readings? A blood pressure reading consists of a higher number over a lower number. Ideally, your blood pressure should be below 120/80. The first ("top") number is called the systolic pressure. It is a measure of the pressure in your arteries as your heart beats. The second ("bottom") number is called the diastolic pressure. It is a measure of the pressure in your arteries as the heart relaxes. What does my blood pressure reading mean? Blood pressure is classified into four stages. Based on your blood pressure reading, your health care provider may use the following stages to determine what type of treatment you need, if any. Systolic pressure and diastolic pressure are measured in a unit called mm Hg. Normal  Systolic pressure: below 784.  Diastolic pressure: below 80. Elevated  Systolic pressure: 696-295.  Diastolic pressure: below 80. Hypertension stage 1  Systolic pressure: 284-132.  Diastolic pressure: 44-01. Hypertension stage 2  Systolic pressure: 027 or above.  Diastolic pressure: 90 or above. What health risks are associated with hypertension? Managing your hypertension is an important responsibility. Uncontrolled hypertension can lead to:  A heart attack.  A stroke.  A weakened blood vessel (aneurysm).  Heart failure.  Kidney damage.  Eye damage.  Metabolic syndrome.  Memory and concentration problems. What changes can I make to manage my hypertension?  Hypertension can be managed by making lifestyle changes and possibly by taking medicines. Your health care provider will help you make a plan to bring your blood pressure within a normal range. Eating and drinking   Eat a diet that is high in fiber and potassium, and low in salt (sodium), added sugar, and fat. An example eating plan is called the DASH (Dietary Approaches to Stop Hypertension) diet. To eat this way: ? Eat plenty of fresh fruits and vegetables. Try to fill half of your plate at each meal with fruits and vegetables. ? Eat whole grains, such as whole wheat pasta, brown rice, or whole grain bread. Fill about one quarter of your plate with whole grains. ? Eat low-fat diary products. ? Avoid fatty cuts of meat, processed or cured meats, and poultry with skin. Fill about one quarter of your plate with lean proteins such as fish, chicken without skin, beans, eggs, and tofu. ? Avoid premade and processed foods. These tend to be higher in sodium, added sugar, and fat.  Reduce your daily sodium intake. Most people with hypertension should eat less than 1,500 mg of sodium a day.  Limit alcohol intake to no more than 1 drink a day for nonpregnant women and 2 drinks a day for men. One drink equals 12 oz of beer, 5 oz of wine, or 1 oz of hard liquor. Lifestyle  Work with your health care provider to maintain a healthy body weight, or to lose weight. Ask what an ideal weight is for you.  Get at least 30 minutes of exercise that causes your heart to beat faster (aerobic exercise) most days of the week. Activities may include walking, swimming, or biking.  Include exercise  to strengthen your muscles (resistance exercise), such as weight lifting, as part of your weekly exercise routine. Try to do these types of exercises for 30 minutes at least 3 days a week.  Do not use any products that contain nicotine or tobacco, such as cigarettes and e-cigarettes. If you need help quitting, ask your health  care provider.  Control any long-term (chronic) conditions you have, such as high cholesterol or diabetes. Monitoring  Monitor your blood pressure at home as told by your health care provider. Your personal target blood pressure may vary depending on your medical conditions, your age, and other factors.  Have your blood pressure checked regularly, as often as told by your health care provider. Working with your health care provider  Review all the medicines you take with your health care provider because there may be side effects or interactions.  Talk with your health care provider about your diet, exercise habits, and other lifestyle factors that may be contributing to hypertension.  Visit your health care provider regularly. Your health care provider can help you create and adjust your plan for managing hypertension. Will I need medicine to control my blood pressure? Your health care provider may prescribe medicine if lifestyle changes are not enough to get your blood pressure under control, and if:  Your systolic blood pressure is 130 or higher.  Your diastolic blood pressure is 80 or higher. Take medicines only as told by your health care provider. Follow the directions carefully. Blood pressure medicines must be taken as prescribed. The medicine does not work as well when you skip doses. Skipping doses also puts you at risk for problems. Contact a health care provider if:  You think you are having a reaction to medicines you have taken.  You have repeated (recurrent) headaches.  You feel dizzy.  You have swelling in your ankles.  You have trouble with your vision. Get help right away if:  You develop a severe headache or confusion.  You have unusual weakness or numbness, or you feel faint.  You have severe pain in your chest or abdomen.  You vomit repeatedly.  You have trouble breathing. Summary  Hypertension is when the force of blood pumping through your arteries  is too strong. If this condition is not controlled, it may put you at risk for serious complications.  Your personal target blood pressure may vary depending on your medical conditions, your age, and other factors. For most people, a normal blood pressure is less than 120/80.  Hypertension is managed by lifestyle changes, medicines, or both. Lifestyle changes include weight loss, eating a healthy, low-sodium diet, exercising more, and limiting alcohol. This information is not intended to replace advice given to you by your health care provider. Make sure you discuss any questions you have with your health care provider. Document Released: 01/11/2012 Document Revised: 08/10/2018 Document Reviewed: 03/16/2016 Elsevier Patient Education  [place].  Stop Diovan 160/25mg  and start Diovan 320/12.5mg  once daily- take each morning. Monitor blood pressure and heart rate daily, record. Follow-up in 2 weeks. If your blood pressure is consistently <100/60 or >140/90 please call clinic prior to follow-up. Continue to follow DASH diet. Keep Intel intake to 2 max/day. Continue to social distance and wear a mask when in public. Follow-up in 2 weeks.

## 2018-12-10 NOTE — Assessment & Plan Note (Signed)
Stop Diovan 160/25mg  and start Diovan 320/12.5mg  once daily- take each morning. Monitor blood pressure and heart rate daily, record. Follow-up in 2 weeks. If your blood pressure is consistently <100/60 or >140/90 please call clinic prior to follow-up. Continue to follow DASH diet. Keep Intel intake to 2 max/day. Continue to social distance and wear a mask when in public. Follow-up in 2 weeks.

## 2018-12-19 NOTE — Progress Notes (Signed)
Subjective:    Patient ID: [name], male    DOB: 06-08-1930, 85 y.o.   MRN: [id number]  HPI:12/10/2018 OV: 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 is here for 2 week f/u: Uncontrolled HTN Anti-hypertensive increased- Diovan 320/12.5mg  once daily- take each morning. He denies CP/chest tightness with exertion He denies HA/dizziness/change in vision Ambulatory BP readings- Week one after increase of Diovan SBP 130-160, mean 150 DBP 70-80 HR 60-70 Week Two after increase SBP 120-140, mean 130 DBP 70-80 HR 60-70 He remains quite active Again discussed keeping Miller Lite intake to min- 0-1/day  Fasting Labs obtained today  Of note- His father passed from CVA at age 19, his paternal grandfather passed away at age 90 from [place]   Patient Care Team    Relationship Specialty Notifications Start End  Mina Marble D, NP PCP - General Family Medicine  12/19/17   Leonard Downing, MD Referring Physician Family Medicine  12/19/17   Tanda Rockers, MD Consulting Physician Pulmonary Disease  12/19/17   Griselda Miner, MD Consulting Physician Dermatology  12/19/17   Ramonita Lab, Jovita Kussmaul, MD Referring Physician Ophthalmology  12/19/17     Patient Active Problem List   Diagnosis Date Noted  . Bronchitis 07/16/2018  . Healthcare maintenance 06/06/2018  . Elevated LDL cholesterol level 06/06/2018  . Aortic insufficiency 09/15/2017  . Chronic asthma, moderate persistent, uncomplicated 09/98/3382  . Hyperlipidemia, mixed 05/06/2015  . Rectal cancer ([place]) 05/12/2011  . Essential hypertension 08/25/2009  . ASTHMA, UNSPECIFIED 08/25/2009  . DYSPNEA  08/25/2009     Past Medical History:  Diagnosis Date  . Asthma   . Depression    wife died 2 months ago. 05/04/11  . Eyelid cancer   . Hyperlipidemia, mixed 05/06/2015  . Hypertension   . Rectal cancer ([place]) 1999     Past Surgical History:  Procedure Laterality Date  . COLON RESECTION  1999  . cyst eyelid  2012  . HAND TUMOR EXCISION  2010   right     Family History  Problem Relation Age of Onset  . Hypertension Father   . Stroke Father   . Suicidality Brother   . Colon cancer Neg Hx   . Rectal cancer Neg Hx      Social History   Substance and Sexual Activity  Drug Use No     Social History   Substance and Sexual Activity  Alcohol Use Yes  . Alcohol/week: 16.0 standard drinks  . Types: 16 Cans of beer per week     Social History   Tobacco Use  Smoking Status Never Smoker  Smokeless Tobacco Never Used  Tobacco Comment   Never Used Tobacco     Outpatient Encounter Medications as of 12/25/2018  Medication Sig  . betamethasone dipropionate (DIPROLENE) 0.05 % cream APPLY EVERY 12 HOURS TO AFFECTED AREA FOR PSORIASIS  . budesonide-formoterol (SYMBICORT) 160-4.5 MCG/ACT inhaler Inhale 2 puffs into the lungs 2 (two) times daily.  Marland Kitchen latanoprost (XALATAN) 0.005 % ophthalmic solution Place 1 drop into both eyes at bedtime.  . valsartan-hydrochlorothiazide (DIOVAN HCT) 320-12.5 MG tablet Take 1 tablet by mouth daily.   Facility-Administered Encounter Medications as of 12/25/2018  Medication  . ipratropium-albuterol (DUONEB) 0.5-2.5 (3) MG/3ML nebulizer solution 3 mL    Allergies: Patient has no known allergies.  Body mass index is 24.71 kg/m.  Blood pressure 138/70, pulse 65, temperature 98.7 F (37.1 C), temperature source Oral, height 5\' 6"  (1.676 m), weight 153 lb 1.6 oz (69.4 kg), SpO2 95 %.   Review of Systems  Constitutional: Positive for fatigue. Negative for activity change, appetite change, chills, diaphoresis, fever and unexpected  weight change.  Eyes: Positive for redness and visual disturbance.  Respiratory: Negative for cough, chest tightness, shortness of breath, wheezing and stridor.   Cardiovascular: Negative for chest pain, palpitations and leg swelling.  Endocrine: Negative for cold intolerance, heat intolerance, polydipsia, polyphagia and polyuria.  Neurological: Negative for dizziness and headaches.  Hematological: Negative for adenopathy. Does not bruise/bleed easily.       Objective:   Physical Exam Constitutional:      General: He is not in acute distress.    Appearance: Normal appearance. He is normal weight. He is not ill-appearing, toxic-appearing or diaphoretic.  HENT:     Head: Normocephalic and atraumatic.  Cardiovascular:     Rate and Rhythm: Normal rate and regular rhythm.     Pulses: Normal pulses.     Heart sounds: Normal heart sounds. No murmur. No friction rub. No gallop.   Pulmonary:     Effort: Pulmonary effort is normal. No respiratory distress.     Breath sounds: Normal breath sounds. No stridor. No wheezing, rhonchi or rales.  Chest:     Chest wall: No tenderness.  Skin:    Capillary Refill: Capillary refill takes less than 2 seconds.  Neurological:     Mental Status: He is alert and oriented to person, place, and time.  Psychiatric:        Mood and Affect: Mood normal.        Behavior: Behavior normal.        Thought Content: Thought content normal.        Judgment: Judgment normal.        Assessment & Plan:   1. Essential hypertension   2. Hyperlipidemia, mixed   3. Healthcare maintenance   4. Elevated LDL cholesterol level     Essential hypertension Your home blood pressure and in clinic today is at goal. Continue all medications as directed. Increase plain water intake, strive for at least 30 oz/day. Keep Intel intake to 2 max per day. Remain active, follow DASH diet. Continue to check your blood pressure and heart rate several times a week and call  clinic if consistently >140/90 or <100/60 or if your Heart rate is  Consistently <55 or >100 We will call you when lab results are available. Follow-up in 3 months, sooner if needed. Continue to social distance and wear a mask when in public.    FOLLOW-UP:  Return in about 3 months (around 03/27/2019) for Regular Follow Up.

## 2018-12-25 ENCOUNTER — Other Ambulatory Visit: Payer: Self-pay

## 2018-12-25 ENCOUNTER — Encounter: Payer: Self-pay | Admitting: Adult Health

## 2018-12-25 ENCOUNTER — Ambulatory Visit (INDEPENDENT_AMBULATORY_CARE_PROVIDER_SITE_OTHER): Payer: Medicare HMO | Admitting: Adult Health

## 2018-12-25 VITALS — BP 138/70 | HR 65 | Temp 98.7°F | Ht 66.0 in | Wt 153.1 lb

## 2018-12-25 DIAGNOSIS — E782 Mixed hyperlipidemia: Secondary | ICD-10-CM | POA: Diagnosis not present

## 2018-12-25 DIAGNOSIS — R739 Hyperglycemia, unspecified: Secondary | ICD-10-CM | POA: Diagnosis not present

## 2018-12-25 DIAGNOSIS — Z0000 Encounter for general adult medical examination without abnormal findings: Secondary | ICD-10-CM

## 2018-12-25 DIAGNOSIS — I10 Essential (primary) hypertension: Secondary | ICD-10-CM

## 2018-12-25 DIAGNOSIS — E7800 Pure hypercholesterolemia, unspecified: Secondary | ICD-10-CM | POA: Diagnosis not present

## 2018-12-25 NOTE — Assessment & Plan Note (Signed)
Your home blood pressure and in clinic today is at goal. Continue all medications as directed. Increase plain water intake, strive for at least 30 oz/day. Keep Intel intake to 2 max per day. Remain active, follow DASH diet. Continue to check your blood pressure and heart rate several times a week and call clinic if consistently >140/90 or <100/60 or if your Heart rate is  Consistently <55 or >100 We will call you when lab results are available. Follow-up in 3 months, sooner if needed. Continue to social distance and wear a mask when in public.

## 2018-12-25 NOTE — Patient Instructions (Addendum)
Managing Your Hypertension Hypertension is commonly called high blood pressure. This is when the force of your blood pressing against the walls of your arteries is too strong. Arteries are blood vessels that carry blood from your heart throughout your body. Hypertension forces the heart to work harder to pump blood, and may cause the arteries to become narrow or stiff. Having untreated or uncontrolled hypertension can cause heart attack, stroke, kidney disease, and other problems. What are blood pressure readings? A blood pressure reading consists of a higher number over a lower number. Ideally, your blood pressure should be below 120/80. The first ("top") number is called the systolic pressure. It is a measure of the pressure in your arteries as your heart beats. The second ("bottom") number is called the diastolic pressure. It is a measure of the pressure in your arteries as the heart relaxes. What does my blood pressure reading mean? Blood pressure is classified into four stages. Based on your blood pressure reading, your health care provider may use the following stages to determine what type of treatment you need, if any. Systolic pressure and diastolic pressure are measured in a unit called mm Hg. Normal  Systolic pressure: below 784.  Diastolic pressure: below 80. Elevated  Systolic pressure: 696-295.  Diastolic pressure: below 80. Hypertension stage 1  Systolic pressure: 284-132.  Diastolic pressure: 44-01. Hypertension stage 2  Systolic pressure: 027 or above.  Diastolic pressure: 90 or above. What health risks are associated with hypertension? Managing your hypertension is an important responsibility. Uncontrolled hypertension can lead to:  A heart attack.  A stroke.  A weakened blood vessel (aneurysm).  Heart failure.  Kidney damage.  Eye damage.  Metabolic syndrome.  Memory and concentration problems. What changes can I make to manage my hypertension?  Hypertension can be managed by making lifestyle changes and possibly by taking medicines. Your health care provider will help you make a plan to bring your blood pressure within a normal range. Eating and drinking   Eat a diet that is high in fiber and potassium, and low in salt (sodium), added sugar, and fat. An example eating plan is called the DASH (Dietary Approaches to Stop Hypertension) diet. To eat this way: ? Eat plenty of fresh fruits and vegetables. Try to fill half of your plate at each meal with fruits and vegetables. ? Eat whole grains, such as whole wheat pasta, brown rice, or whole grain bread. Fill about one quarter of your plate with whole grains. ? Eat low-fat diary products. ? Avoid fatty cuts of meat, processed or cured meats, and poultry with skin. Fill about one quarter of your plate with lean proteins such as fish, chicken without skin, beans, eggs, and tofu. ? Avoid premade and processed foods. These tend to be higher in sodium, added sugar, and fat.  Reduce your daily sodium intake. Most people with hypertension should eat less than 1,500 mg of sodium a day.  Limit alcohol intake to no more than 1 drink a day for nonpregnant women and 2 drinks a day for men. One drink equals 12 oz of beer, 5 oz of wine, or 1 oz of hard liquor. Lifestyle  Work with your health care provider to maintain a healthy body weight, or to lose weight. Ask what an ideal weight is for you.  Get at least 30 minutes of exercise that causes your heart to beat faster (aerobic exercise) most days of the week. Activities may include walking, swimming, or biking.  Include exercise  to strengthen your muscles (resistance exercise), such as weight lifting, as part of your weekly exercise routine. Try to do these types of exercises for 30 minutes at least 3 days a week.  Do not use any products that contain nicotine or tobacco, such as cigarettes and e-cigarettes. If you need help quitting, ask your health  care provider.  Control any long-term (chronic) conditions you have, such as high cholesterol or diabetes. Monitoring  Monitor your blood pressure at home as told by your health care provider. Your personal target blood pressure may vary depending on your medical conditions, your age, and other factors.  Have your blood pressure checked regularly, as often as told by your health care provider. Working with your health care provider  Review all the medicines you take with your health care provider because there may be side effects or interactions.  Talk with your health care provider about your diet, exercise habits, and other lifestyle factors that may be contributing to hypertension.  Visit your health care provider regularly. Your health care provider can help you create and adjust your plan for managing hypertension. Will I need medicine to control my blood pressure? Your health care provider may prescribe medicine if lifestyle changes are not enough to get your blood pressure under control, and if:  Your systolic blood pressure is 130 or higher.  Your diastolic blood pressure is 80 or higher. Take medicines only as told by your health care provider. Follow the directions carefully. Blood pressure medicines must be taken as prescribed. The medicine does not work as well when you skip doses. Skipping doses also puts you at risk for problems. Contact a health care provider if:  You think you are having a reaction to medicines you have taken.  You have repeated (recurrent) headaches.  You feel dizzy.  You have swelling in your ankles.  You have trouble with your vision. Get help right away if:  You develop a severe headache or confusion.  You have unusual weakness or numbness, or you feel faint.  You have severe pain in your chest or abdomen.  You vomit repeatedly.  You have trouble breathing. Summary  Hypertension is when the force of blood pumping through your arteries  is too strong. If this condition is not controlled, it may put you at risk for serious complications.  Your personal target blood pressure may vary depending on your medical conditions, your age, and other factors. For most people, a normal blood pressure is less than 120/80.  Hypertension is managed by lifestyle changes, medicines, or both. Lifestyle changes include weight loss, eating a healthy, low-sodium diet, exercising more, and limiting alcohol. This information is not intended to replace advice given to you by your health care provider. Make sure you discuss any questions you have with your health care provider. Document Released: 01/11/2012 Document Revised: 08/10/2018 Document Reviewed: 03/16/2016 Elsevier Patient Education  2020 [employer].   Your home blood pressure and in clinic today is at goal. Continue all medications as directed. Increase plain water intake, strive for at least 30 oz/day. Keep Intel intake to 2 max per day. Remain active, follow DASH diet. Continue to check your blood pressure and heart rate several times a week and call clinic if consistently >140/90 or <100/60 or if your Heart rate is  Consistently <55 or >100 We will call you when lab results are available. Follow-up in 3 months, sooner if needed. Continue to social distance and wear a mask when in  public.

## 2018-12-26 LAB — CBC WITH DIFFERENTIAL/PLATELET
Basophils Absolute: 0 10*3/uL (ref 0.0–0.2)
Basos: 1 %
EOS (ABSOLUTE): 0.3 10*3/uL (ref 0.0–0.4)
Eos: 7 %
Hematocrit: 40.9 % (ref 37.5–51.0)
Hemoglobin: 14.2 g/dL (ref 13.0–17.7)
Immature Grans (Abs): 0 10*3/uL (ref 0.0–0.1)
Immature Granulocytes: 0 %
Lymphocytes Absolute: 0.8 10*3/uL (ref 0.7–3.1)
Lymphs: 17 %
MCH: 31.7 pg (ref 26.6–33.0)
MCHC: 34.7 g/dL (ref 31.5–35.7)
MCV: 91 fL (ref 79–97)
Monocytes Absolute: 0.5 10*3/uL (ref 0.1–0.9)
Monocytes: 10 %
Neutrophils Absolute: 3.2 10*3/uL (ref 1.4–7.0)
Neutrophils: 65 %
Platelets: 214 10*3/uL (ref 150–450)
RBC: 4.48 x10E6/uL (ref 4.14–5.80)
RDW: 13 % (ref 11.6–15.4)
WBC: 4.8 10*3/uL (ref 3.4–10.8)

## 2018-12-26 LAB — HEMOGLOBIN A1C
Est. average glucose Bld gHb Est-mCnc: 111 mg/dL
Hgb A1c MFr Bld: 5.5 % (ref 4.8–5.6)

## 2018-12-26 LAB — COMPREHENSIVE METABOLIC PANEL
ALT: 72 IU/L — ABNORMAL HIGH (ref 0–44)
AST: 52 IU/L — ABNORMAL HIGH (ref 0–40)
Albumin/Globulin Ratio: 1.7 (ref 1.2–2.2)
Albumin: 4.3 g/dL (ref 3.6–4.6)
Alkaline Phosphatase: 64 IU/L (ref 39–117)
BUN/Creatinine Ratio: 11 (ref 10–24)
BUN: 8 mg/dL (ref 8–27)
Bilirubin Total: 0.7 mg/dL (ref 0.0–1.2)
CO2: 23 mmol/L (ref 20–29)
Calcium: 9.6 mg/dL (ref 8.6–10.2)
Chloride: 96 mmol/L (ref 96–106)
Creatinine, Ser: 0.73 mg/dL — ABNORMAL LOW (ref 0.76–1.27)
GFR calc Af Amer: 98 mL/min/{1.73_m2} (ref >59)
GFR calc non Af Amer: 85 mL/min/{1.73_m2} (ref >59)
Globulin, Total: 2.6 g/dL (ref 1.5–4.5)
Glucose: 97 mg/dL (ref 65–99)
Potassium: 4.8 mmol/L (ref 3.5–5.2)
Sodium: 134 mmol/L (ref 134–144)
Total Protein: 6.9 g/dL (ref 6.0–8.5)

## 2018-12-26 LAB — LIPID PANEL
Chol/HDL Ratio: 2.6 ratio (ref 0.0–5.0)
Cholesterol, Total: 169 mg/dL (ref 100–199)
HDL: 64 mg/dL (ref >39)
LDL Calculated: 94 mg/dL (ref 0–99)
Triglycerides: 57 mg/dL (ref 0–149)
VLDL Cholesterol Cal: 11 mg/dL (ref 5–40)

## 2018-12-26 LAB — TSH: TSH: 2.03 u[IU]/mL (ref 0.450–4.500)

## 2019-01-08 ENCOUNTER — Other Ambulatory Visit: Payer: Self-pay

## 2019-01-10 ENCOUNTER — Telehealth: Payer: Self-pay | Admitting: Adult Health

## 2019-01-10 NOTE — Telephone Encounter (Signed)
Good Evening Tonya, All breathing Rx needs to come from Pulm/Dr. Melvyn Novas Thanks! Valetta Fuller

## 2019-01-10 NOTE — Telephone Encounter (Signed)
We have not prescribed these medications for the patient previously.  Please review and refill if appropriate.  T. Nelson, CMA  

## 2019-01-10 NOTE — Telephone Encounter (Signed)
LVM informing pt to request refill from Dr. Melvyn Novas.  Charyl Bigger, CMA

## 2019-01-10 NOTE — Telephone Encounter (Signed)
Patient called states [name] is not original prescriber of this Rx but he needs a refill : budesonide-formoterol (SYMBICORT) 160-4.5 MCG/ACT inhaler [id number]   Order Details Dose: 2 puff Route: Inhalation Frequency: 2 times daily  Dispense Quantity: 1 Inhaler Refills: 0 Fills remaining: --        Sig: Inhale 2 puffs into the lungs 2 (two) times daily.     --Forwarding message to medical assistant that if approved send refill order to :  [id number] [address] [place], [place] [phone] (Phone) [phone] (Fax)   --glh

## 2019-01-14 ENCOUNTER — Telehealth: Payer: Self-pay | Admitting: Internal Medicine

## 2019-01-14 MED ORDER — BUDESONIDE-FORMOTEROL FUMARATE 160-4.5 MCG/ACT IN AERO: 2.0000 | INHALATION_SPRAY | Freq: Two times a day (BID) | RESPIRATORY_TRACT | 4 refills | Status: DC

## 2019-01-14 NOTE — Telephone Encounter (Signed)
LMOM for pt that Symbicort refill was sent to [facility].  Nothing further needed.

## 2019-01-21 DIAGNOSIS — R69 Illness, unspecified: Secondary | ICD-10-CM | POA: Diagnosis not present

## 2019-02-05 DIAGNOSIS — R69 Illness, unspecified: Secondary | ICD-10-CM | POA: Diagnosis not present

## 2019-03-04 ENCOUNTER — Other Ambulatory Visit: Payer: Self-pay | Admitting: Adult Health

## 2019-03-26 NOTE — Progress Notes (Signed)
Virtual Visit via Telephone Note  I connected with [name] on 03/27/2019 at  8:45 AM EST by telephone and verified that I am speaking with the correct person using two identifiers.  Location: Patient: Home Provider: In Clinic   I discussed the limitations, risks, security and privacy concerns of performing an evaluation and management service by telephone and the availability of in person appointments. I also discussed with the patient that there may be a patient responsible charge related to this service. The patient expressed understanding and agreed to proceed.   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 He denies CP/chest tightness with exertion He denies HA/dizziness/change in vision Ambulatory BP readings- Week one after increase of Diovan SBP 130-160, mean 150 DBP 70-80 HR 60-70 Week Two after increase SBP 120-140, mean 130 DBP 70-80 HR 60-70 He remains quite active Again discussed keeping Miller Lite intake to min- 0-1/day  Fasting Labs obtained today  Of note- His father passed from CVA at age 7, his paternal grandfather passed away at age 33 from CVA   03/26/2019 OV: [name] calls in today for regular f/u- HTN Ambulatory BP SBP 140-180, mean 160 DBP 70-80 HR 60-70 He denies CP/chest tightness with exertion. He reports walking to mailbox briskly without any dyspnea. He can walk "half way to you clinic without needing to take a break", estimated distance is 2  miles. Valsartan/HCTZ was maxed out in August Will add on Dihydropyridine CCB to help lower BP  12/25/2018 Labs: Tot-169  LDL-94  TSH-WNL, 2.030  A1c-WNL, 5.5  CBC-stable  CMP-AST- 52 (0-40), ALT- 72 (0-44)- likely r/t to daily ETOH use. Please encourage him to dramatically Intel use- zero is best!  Will re-check levels at f/u in 3 months.  Needs CMP re-check- he declined updating labs- will have full panel with [name] in Jan 2021  Patient Care Team    Relationship Specialty Notifications Start End  Eric Marble D, NP PCP - General Family Medicine  12/19/17   Eric Downing, MD Referring Physician Family Medicine  12/19/17   Eric Rockers, MD Consulting Physician Pulmonary Disease  12/19/17   Eric Miner, MD Consulting Physician Dermatology  12/19/17   [name], Eric Kussmaul, MD Referring Physician Ophthalmology  12/19/17     Patient Active Problem List   Diagnosis Date Noted  . Bronchitis 07/16/2018  . Healthcare maintenance 06/06/2018  . Elevated LDL cholesterol level 06/06/2018  . Aortic insufficiency 09/15/2017  . Chronic asthma, moderate persistent, uncomplicated 0000000  . Hyperlipidemia, mixed 05/06/2015  . Rectal cancer ([place]) 05/12/2011  . Essential hypertension 08/25/2009  . ASTHMA, UNSPECIFIED 08/25/2009  . DYSPNEA 08/25/2009     Past Medical History:  Diagnosis Date  . Asthma   . Depression    wife died 2 months ago. 04/28/2011  . Eyelid cancer   . Hyperlipidemia, mixed 05/06/2015  . Hypertension   . Rectal cancer ([place]) 1999     Past Surgical History:  Procedure Laterality Date  . COLON RESECTION  1999  . cyst eyelid  2012  .  HAND TUMOR EXCISION  2010   right     Family History  Problem Relation Age of Onset  . Hypertension Father   . Stroke Father   . Suicidality Brother   . Colon cancer Neg Hx   . Rectal cancer Neg Hx      Social History   Substance and Sexual Activity  Drug Use No     Social  History   Substance and Sexual Activity  Alcohol Use Yes  . Alcohol/week: 16.0 standard drinks  . Types: 16 Cans of beer per week     Social History   Tobacco Use  Smoking Status Never Smoker  Smokeless Tobacco Never Used  Tobacco Comment   Never Used Tobacco     Outpatient Encounter Medications as of 03/27/2019  Medication Sig  . betamethasone dipropionate (DIPROLENE) 0.05 % cream APPLY EVERY 12 HOURS TO AFFECTED AREA FOR PSORIASIS  . budesonide-formoterol (SYMBICORT) 160-4.5 MCG/ACT inhaler Inhale 2 puffs into the lungs 2 (two) times daily.  Marland Kitchen latanoprost (XALATAN) 0.005 % ophthalmic solution Place 1 drop into both eyes at bedtime.  . valsartan-hydrochlorothiazide (DIOVAN-HCT) 320-12.5 MG tablet TAKE 1 TABLET BY MOUTH EVERY DAY   Facility-Administered Encounter Medications as of 03/27/2019  Medication  . ipratropium-albuterol (DUONEB) 0.5-2.5 (3) MG/3ML nebulizer solution 3 mL    Allergies: Patient has no known allergies.  Body mass index is 24.21 kg/m.  Blood pressure (!) 182/79, pulse 64, height 5\' 6"  (1.676 m), weight 150 lb (68 kg). Review of Systems: General:   Denies fever, chills, unexplained weight loss.  Optho/Auditory:   Denies visual changes, blurred vision/LOV Respiratory:   Denies SOB, DOE more than baseline levels.  Cardiovascular:   Denies chest pain, palpitations, new onset peripheral edema  Gastrointestinal:   Denies nausea, vomiting, diarrhea.  Genitourinary: Denies dysuria, freq/ urgency, flank pain or discharge from genitals.  Endocrine:     Denies hot or cold intolerance, polyuria, polydipsia. Musculoskeletal:   Denies unexplained myalgias, joint swelling, unexplained arthralgias, gait problems.  Skin:  Denies rash, suspicious lesions Neurological:     Denies dizziness, unexplained weakness, numbness  Psychiatric/Behavioral:   Denies mood changes, suicidal or homicidal ideations, hallucinations  Observations/Objective: No acute distress  noted during the telephone conversation.  Assessment and Plan: Continue valsartan/HCTZ 320/12.5mg  Start Amlodipine 2.5mg  QD Check BP/HR daily- record Remain well hydrated, follow heart healthy diet. Encouraged to keep beer intake to min He declined updating CMP today- will have full labs with Oncology Jan 2021 Continue to social distance and wear a mask when in public Follow Up Instructions: 2 weeks TeleMedicine appt   I discussed the assessment and treatment plan with the patient. The patient was provided an opportunity to ask questions and all were answered. The patient agreed with the plan and demonstrated an understanding of the instructions.   The patient was advised to call back or seek an in-person evaluation if the symptoms worsen or if the condition fails to improve as anticipated.  I provided 18 minutes of non-face-to-face time during this encounter.   Esaw Grandchild, NP

## 2019-03-27 ENCOUNTER — Other Ambulatory Visit: Payer: Self-pay

## 2019-03-27 ENCOUNTER — Encounter: Payer: Self-pay | Admitting: Adult Health

## 2019-03-27 ENCOUNTER — Ambulatory Visit (INDEPENDENT_AMBULATORY_CARE_PROVIDER_SITE_OTHER): Payer: Medicare HMO | Admitting: Adult Health

## 2019-03-27 DIAGNOSIS — Z0000 Encounter for general adult medical examination without abnormal findings: Secondary | ICD-10-CM | POA: Diagnosis not present

## 2019-03-27 DIAGNOSIS — I10 Essential (primary) hypertension: Secondary | ICD-10-CM

## 2019-03-27 MED ORDER — AMLODIPINE BESYLATE 2.5 MG PO TABS: 2.5000 mg | ORAL_TABLET | Freq: Every day | ORAL | 0 refills | Status: DC

## 2019-03-27 NOTE — Assessment & Plan Note (Signed)
Assessment and Plan: Continue valsartan/HCTZ 320/12.5mg  Start Amlodipine 2.5mg  QD Check BP/HR daily- record Remain well hydrated, follow heart healthy diet. Encouraged to keep beer intake to min He declined updating CMP today- will have full labs with Oncology Jan 2021 Continue to social distance and wear a mask when in public Follow Up Instructions: 2 weeks TeleMedicine appt   I discussed the assessment and treatment plan with the patient. The patient was provided an opportunity to ask questions and all were answered. The patient agreed with the plan and demonstrated an understanding of the instructions.   The patient was advised to call back or seek an in-person evaluation if the symptoms worsen or if the condition fails to improve as anticipated.

## 2019-03-27 NOTE — Assessment & Plan Note (Signed)
Ambulatory BP SBP 140-180, mean 160 DBP 70-80 HR 60-70 He denies CP/chest tightness with exertion. He reports walking to mailbox briskly without any dyspnea. He can walk "half way to you clinic without needing to take a break", estimated distance is 2 miles. Valsartan/HCTZ was maxed out in August Will add on Dihydropyridine CCB to help lower BP

## 2019-04-09 NOTE — Progress Notes (Signed)
Virtual Visit via Telephone Note  I connected with [name] on 04/10/19 at  9:30 AM EST by telephone and verified that I am speaking with the correct person using two identifiers.  Location: Patient: Home Provider: In Clinic   I discussed the limitations, risks, security and privacy concerns of performing an evaluation and management service by telephone and the availability of in person appointments. I also discussed with the patient that there may be a patient responsible charge related to this service. The patient expressed understanding and agreed to proceed.   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 He denies CP/chest tightness with exertion He denies HA/dizziness/change in vision Ambulatory BP readings- Week one after increase of Diovan SBP 130-160, mean 150 DBP 70-80 HR 60-70 Week Two after increase SBP 120-140, mean 130 DBP 70-80 HR 60-70 He remains quite active Again discussed keeping Miller Lite intake to min- 0-1/day  Fasting Labs obtained today  Of note- His father passed from CVA at age 67, his paternal grandfather passed away at age 95 from CVA  03/26/2019 OV: [name] calls in today for regular f/u- HTN Ambulatory BP SBP 140-180, mean 160 DBP 70-80 HR 60-70 He denies CP/chest tightness with exertion. He reports walking to mailbox briskly without any dyspnea. He can walk "half way to you clinic without needing to take a break", estimated distance is 2  miles. Valsartan/HCTZ was maxed out in August Will add on Dihydropyridine CCB to help lower BP  12/25/2018 Labs: Tot-169  LDL-94  TSH-WNL, 2.030  A1c-WNL, 5.5  CBC-stable  CMP-AST- 52 (0-40), ALT- 72 (0-44)- likely r/t to daily ETOH use. Please encourage him to dramatically Intel use- zero is best!  Will re-check levels at f/u in 3 months.  Needs CMP re-check- he declined updating labs- will have full panel with [name] in Jan 2021  04/10/2019 OV: [name] calls in for 2 week f/u:uncontrolled HTN He was started on Amlodipine 2.5mg  QD in addition to Valsartan/HCTZ (Diovan) 320/12.5mg   Ambulatory BP SBP 120-150, mean  Upper 130s, low 140s DBP 70-80  HR 60-70 He denies CP/chest tightness with exertion. He continues to enjoy several cups of coffee each morning and several Miller Lite's each evening  Again discussed re-checking CMP, re: elevated LFTs- he declined - will have labs drawn with his annual f/u with Dr. Marin Olp Jan 20201  Patient Care Team    Relationship Specialty Notifications Start End  Mina Marble D, NP PCP - General Family Medicine  12/19/17   Leonard Downing, MD Referring Physician Family Medicine  12/19/17   Tanda Rockers, MD Consulting Physician Pulmonary Disease  12/19/17   Griselda Miner, MD Consulting Physician Dermatology  12/19/17   Ramonita Lab, Jovita Kussmaul, MD Referring Physician Ophthalmology  12/19/17     Patient Active Problem List   Diagnosis Date Noted  . Bronchitis 07/16/2018  . Healthcare maintenance 06/06/2018  . Elevated LDL cholesterol level 06/06/2018  . Aortic insufficiency 09/15/2017  . Chronic asthma, moderate persistent, uncomplicated 0000000  . Hyperlipidemia, mixed 05/06/2015  . Rectal cancer ([place])  05/12/2011  . Essential hypertension 08/25/2009  . ASTHMA, UNSPECIFIED 08/25/2009  . DYSPNEA 08/25/2009     Past Medical History:  Diagnosis Date  . Asthma   . Depression    wife died 2 months ago. 05/21/11  . Eyelid cancer   . Hyperlipidemia, mixed 05/06/2015  . Hypertension   . Rectal cancer ([place]) 1999     Past Surgical History:  Procedure Laterality Date  . COLON RESECTION  1999  . cyst eyelid  2012  . HAND TUMOR EXCISION  2010   right     Family History  Problem Relation Age of Onset  . Hypertension Father   . Stroke Father   . Suicidality Brother   . Colon cancer Neg Hx   . Rectal cancer Neg Hx      Social History   Substance and Sexual Activity  Drug Use No     Social History   Substance and Sexual Activity  Alcohol Use Yes  . Alcohol/week: 16.0 standard drinks  . Types: 16 Cans of beer per week     Social History   Tobacco Use  Smoking Status Never Smoker  Smokeless Tobacco Never Used  Tobacco Comment   Never Used Tobacco     Outpatient Encounter Medications as of 04/10/2019  Medication Sig  . amLODipine (NORVASC) 2.5 MG tablet Take 1 tablet (2.5 mg total) by mouth daily.  . betamethasone dipropionate (DIPROLENE) 0.05 % cream APPLY EVERY 12 HOURS TO AFFECTED AREA FOR PSORIASIS  . budesonide-formoterol (SYMBICORT) 160-4.5 MCG/ACT inhaler Inhale 2 puffs into the lungs 2 (two) times daily.  Marland Kitchen latanoprost (XALATAN) 0.005 % ophthalmic solution Place 1 drop into both eyes at bedtime.  . valsartan-hydrochlorothiazide (DIOVAN-HCT) 320-12.5 MG tablet TAKE 1 TABLET BY MOUTH EVERY DAY   Facility-Administered Encounter Medications as of 04/10/2019  Medication  . ipratropium-albuterol (DUONEB) 0.5-2.5 (3) MG/3ML nebulizer solution 3 mL    Allergies: Patient has no known allergies.  There is no height or weight on file to calculate BMI.  Blood pressure (!) 145/84, pulse 64. Review of Systems: General:   Denies fever, chills, unexplained weight loss.  Optho/Auditory:   Denies visual changes, blurred vision/LOV Respiratory:   Denies SOB, DOE more than baseline levels.  Cardiovascular:   Denies chest pain, palpitations, new onset peripheral edema   Gastrointestinal:   Denies nausea, vomiting, diarrhea.  Genitourinary: Denies dysuria, freq/ urgency, flank pain or discharge from genitals.  Endocrine:     Denies hot or cold intolerance, polyuria, polydipsia. Musculoskeletal:   Denies unexplained myalgias, joint swelling, unexplained arthralgias, gait problems.  Skin:  Denies rash, suspicious lesions Neurological:     Denies dizziness, unexplained weakness, numbness  Psychiatric/Behavioral:   Denies mood changes, suicidal or homicidal ideations, hallucinations This patient does not have sx concerning for COVID-19 Infection (ie; fever, chills, cough, new or worsening shortness of breath).  Observations/Objective: No acute distress noted during the telephone conversation.  Assessment and Plan: Continue Amlodipine 2.5mg  QD, Valsartan/HCTZ 320/12.5mg  QD Heart Healthy Diet Again encouraged to reduce caffeine and ETOH use Continue active lifestyle Check BP/HR several times per week and if consistently <100/60 or >140/90- call clinic  Continue to social distance and wear a mask when in public.  Follow Up Instructions: 3 month OV   I discussed the assessment and treatment plan with the patient. The patient was provided an opportunity to ask questions and all were answered. The patient agreed with the plan and demonstrated an understanding of the instructions.  The patient was advised to call back or seek an in-person evaluation if the symptoms worsen or if the condition fails to improve as anticipated.  I provided 7 minutes of non-face-to-face time during this encounter.   Esaw Grandchild, NP

## 2019-04-10 ENCOUNTER — Other Ambulatory Visit: Payer: Self-pay

## 2019-04-10 ENCOUNTER — Ambulatory Visit (INDEPENDENT_AMBULATORY_CARE_PROVIDER_SITE_OTHER): Payer: Medicare HMO | Admitting: Adult Health

## 2019-04-10 ENCOUNTER — Encounter: Payer: Self-pay | Admitting: Adult Health

## 2019-04-10 DIAGNOSIS — I10 Essential (primary) hypertension: Secondary | ICD-10-CM | POA: Diagnosis not present

## 2019-04-10 NOTE — Assessment & Plan Note (Signed)
Assessment and Plan:  Ambulatory BP SBP 120-150, mean  Upper 130s, low 140s DBP 70-80  HR 60-70 He denies CP/chest tightness with exertion. Continue Amlodipine 2.5mg  QD, Valsartan/HCTZ 320/12.5mg  QD Heart Healthy Diet Again encouraged to reduce caffeine and ETOH use Continue active lifestyle Check BP/HR several times per week and if consistently <100/60 or >140/90- call clinic  Continue to social distance and wear a mask when in public.  Follow Up Instructions: 3 month OV   I discussed the assessment and treatment plan with the patient. The patient was provided an opportunity to ask questions and all were answered. The patient agreed with the plan and demonstrated an understanding of the instructions.   The patient was advised to call back or seek an in-person evaluation if the symptoms worsen or if the condition fails to improve as anticipated.

## 2019-04-22 DIAGNOSIS — H401131 Primary open-angle glaucoma, bilateral, mild stage: Secondary | ICD-10-CM | POA: Diagnosis not present

## 2019-04-22 DIAGNOSIS — H25013 Cortical age-related cataract, bilateral: Secondary | ICD-10-CM | POA: Diagnosis not present

## 2019-04-22 DIAGNOSIS — H2513 Age-related nuclear cataract, bilateral: Secondary | ICD-10-CM | POA: Diagnosis not present

## 2019-05-06 ENCOUNTER — Encounter: Payer: Self-pay | Admitting: Hematology & Oncology

## 2019-05-06 ENCOUNTER — Inpatient Hospital Stay: Payer: Medicare HMO

## 2019-05-06 ENCOUNTER — Inpatient Hospital Stay: Payer: Medicare HMO | Attending: Hematology & Oncology | Admitting: Hematology & Oncology

## 2019-05-06 ENCOUNTER — Other Ambulatory Visit: Payer: Self-pay

## 2019-05-06 VITALS — BP 146/90 | HR 84 | Temp 97.1°F | Resp 19 | Wt 143.0 lb

## 2019-05-06 DIAGNOSIS — Z85048 Personal history of other malignant neoplasm of rectum, rectosigmoid junction, and anus: Secondary | ICD-10-CM | POA: Insufficient documentation

## 2019-05-06 DIAGNOSIS — C20 Malignant neoplasm of rectum: Secondary | ICD-10-CM | POA: Diagnosis not present

## 2019-05-06 DIAGNOSIS — Z9221 Personal history of antineoplastic chemotherapy: Secondary | ICD-10-CM | POA: Diagnosis not present

## 2019-05-06 DIAGNOSIS — Z923 Personal history of irradiation: Secondary | ICD-10-CM | POA: Insufficient documentation

## 2019-05-06 DIAGNOSIS — E782 Mixed hyperlipidemia: Secondary | ICD-10-CM

## 2019-05-06 DIAGNOSIS — B0229 Other postherpetic nervous system involvement: Secondary | ICD-10-CM | POA: Insufficient documentation

## 2019-05-06 LAB — CMP (CANCER CENTER ONLY)
ALT: 50 U/L — ABNORMAL HIGH (ref 0–44)
AST: 33 U/L (ref 15–41)
Albumin: 4.5 g/dL (ref 3.5–5.0)
Alkaline Phosphatase: 62 U/L (ref 38–126)
Anion gap: 9 (ref 5–15)
BUN: 13 mg/dL (ref 8–23)
CO2: 28 mmol/L (ref 22–32)
Calcium: 10.2 mg/dL (ref 8.9–10.3)
Chloride: 93 mmol/L — ABNORMAL LOW (ref 98–111)
Creatinine: 0.92 mg/dL (ref 0.61–1.24)
GFR, Est AFR Am: >60 mL/min (ref >60)
GFR, Estimated: >60 mL/min (ref >60)
Glucose, Bld: 108 mg/dL — ABNORMAL HIGH (ref 70–99)
Potassium: 4.5 mmol/L (ref 3.5–5.1)
Sodium: 130 mmol/L — ABNORMAL LOW (ref 135–145)
Total Bilirubin: 1 mg/dL (ref 0.3–1.2)
Total Protein: 7.6 g/dL (ref 6.5–8.1)

## 2019-05-06 LAB — CBC WITH DIFFERENTIAL (CANCER CENTER ONLY)
Abs Immature Granulocytes: 0.02 10*3/uL (ref 0.00–0.07)
Basophils Absolute: 0.1 10*3/uL (ref 0.0–0.1)
Basophils Relative: 1 %
Eosinophils Absolute: 0.4 10*3/uL (ref 0.0–0.5)
Eosinophils Relative: 6 %
HCT: 43 % (ref 39.0–52.0)
Hemoglobin: 15.3 g/dL (ref 13.0–17.0)
Immature Granulocytes: 0 %
Lymphocytes Relative: 19 %
Lymphs Abs: 1.4 10*3/uL (ref 0.7–4.0)
MCH: 31.7 pg (ref 26.0–34.0)
MCHC: 35.6 g/dL (ref 30.0–36.0)
MCV: 89 fL (ref 80.0–100.0)
Monocytes Absolute: 0.7 10*3/uL (ref 0.1–1.0)
Monocytes Relative: 10 %
Neutro Abs: 4.5 10*3/uL (ref 1.7–7.7)
Neutrophils Relative %: 64 %
Platelet Count: 210 10*3/uL (ref 150–400)
RBC: 4.83 MIL/uL (ref 4.22–5.81)
RDW: 10.9 % — ABNORMAL LOW (ref 11.5–15.5)
WBC Count: 7.1 10*3/uL (ref 4.0–10.5)
nRBC: 0 % (ref 0.0–0.2)

## 2019-05-06 LAB — LIPID PANEL
Cholesterol: 207 mg/dL — ABNORMAL HIGH (ref 0–200)
HDL: 66 mg/dL (ref >40)
LDL Cholesterol: 128 mg/dL — ABNORMAL HIGH (ref 0–99)
Total CHOL/HDL Ratio: 3.1 RATIO
Triglycerides: 67 mg/dL (ref <150)
VLDL: 13 mg/dL (ref 0–40)

## 2019-05-06 NOTE — Progress Notes (Signed)
Hematology and Oncology Follow Up Visit  [name] [id number] [birth year] 86 y.o. 05/06/2019   Principle Diagnosis:   Stage II (T3N0M0) rectal cancer  Current Therapy:    Observation     Interim History:  [name] is back for follow-up.  As always, I see him yearly.  He likes to come back to see [place] once a year.  He just feels very confident with our examinations.  He has had no problems since we last saw him.  He has had no issues with his medicines.  He had no change in medications.  His last CEA from 2020 was 1.58.  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  I myself, enjoy eating a ton of beef.  I probably had 2 pounds of prime rib the day after New Year's.  He still has some postherpetic neuralgia.   He has had no bleeding.  He has had no change in bowel or bladder habits.  He has had no cough or shortness of breath.  There has s been no leg swelling.  Overall, his performance status is ECOG 0.  Medications:  Current Outpatient Medications:  .  amLODipine (NORVASC) 2.5 MG tablet, Take 1 tablet (2.5 mg total) by mouth daily., Disp: 90 tablet, Rfl: 0 .  betamethasone dipropionate (DIPROLENE) 0.05 % cream, APPLY EVERY 12 HOURS TO AFFECTED AREA FOR PSORIASIS, Disp: 45 g, Rfl: 12 .  budesonide-formoterol (SYMBICORT) 160-4.5 MCG/ACT inhaler, Inhale 2 puffs into the lungs 2 (two) times daily., Disp: 1 Inhaler, Rfl: 4 .  latanoprost (XALATAN) 0.005 % ophthalmic solution, Place 1 drop into both eyes at bedtime., Disp: , Rfl:  .  valsartan-hydrochlorothiazide (DIOVAN-HCT) 320-12.5 MG tablet, TAKE 1 TABLET BY MOUTH EVERY DAY, Disp: 90 tablet, Rfl: 0  Current Facility-Administered Medications:  .  ipratropium-albuterol (DUONEB) 0.5-2.5 (3) MG/3ML nebulizer solution 3 mL, 3 mL, Nebulization, Q6H, Danford, Katy D, NP, 3 mL at 07/16/18 [id number]  Allergies: No Known Allergies  Past Medical History,  Surgical history, Social history, and Family History were reviewed and updated.  Review of Systems: Review of Systems  Constitutional: Negative.   HENT: Negative.   Eyes: Positive for blurred vision and redness.  Respiratory: Negative.   Cardiovascular: Negative.   Gastrointestinal: Negative.   Genitourinary: Negative.   Musculoskeletal: Negative.   Skin: Negative.   Neurological: Negative.   Endo/Heme/Allergies: Negative.   Psychiatric/Behavioral: Negative.     Physical Exam:  vitals were not taken for this visit.   Physical Exam Vitals reviewed.  HENT:     Head: Normocephalic and atraumatic.  Eyes:     Pupils: Pupils are equal, round, and reactive to light.  Cardiovascular:     Rate and Rhythm: Normal rate and regular rhythm.     Heart sounds: Normal heart sounds.  Pulmonary:     Effort: Pulmonary effort is normal.     Breath sounds: Normal breath sounds.  Abdominal:     General: Bowel sounds are normal.     Palpations: Abdomen is soft.  Musculoskeletal:        General: No tenderness or deformity. Normal range of motion.     Cervical back: Normal range of motion.  Lymphadenopathy:     Cervical: No cervical adenopathy.  Skin:    General: Skin is warm and dry.     Findings: No erythema or rash.  Neurological:  Mental Status: He is alert and oriented to person, place, and time.  Psychiatric:        Behavior: Behavior normal.        Thought Content: Thought content normal.        Judgment: Judgment normal.      Lab Results  Component Value Date   WBC 4.8 12/25/2018   HGB 14.2 12/25/2018   HCT 40.9 12/25/2018   MCV 91 12/25/2018   PLT 214 12/25/2018     Chemistry      Component Value Date/Time   NA 134 12/25/2018 0936   NA 142 05/04/2017 0741   NA 140 05/05/2016 0747   K 4.8 12/25/2018 0936   K 4.1 05/04/2017 0741   K 4.1 05/05/2016 0747   CL 96 12/25/2018 0936   CL 99 05/04/2017 0741   CO2 23 12/25/2018 0936   CO2 29 05/04/2017 0741   CO2 27  05/05/2016 0747   BUN 8 12/25/2018 0936   BUN 13 05/04/2017 0741   BUN 20.5 05/05/2016 0747   CREATININE 0.73 (L) 12/25/2018 0936   CREATININE 0.99 05/04/2018 0744   CREATININE 0.8 05/04/2017 0741   CREATININE 1.0 05/05/2016 0747   GLU 91 08/10/2017 0000      Component Value Date/Time   CALCIUM 9.6 12/25/2018 0936   CALCIUM 9.7 05/04/2017 0741   CALCIUM 10.2 05/05/2016 0747   ALKPHOS 64 12/25/2018 0936   ALKPHOS 62 05/04/2017 0741   ALKPHOS 79 05/05/2016 0747   AST 52 (H) 12/25/2018 0936   AST 32 05/04/2018 0744   AST 30 05/05/2016 0747   ALT 72 (H) 12/25/2018 0936   ALT 46 (H) 05/04/2018 0744   ALT 36 05/04/2017 0741   ALT 46 05/05/2016 0747   BILITOT 0.7 12/25/2018 0936   BILITOT 0.6 05/04/2018 0744   BILITOT 0.74 05/05/2016 0747         Impression and Plan: [name] is 87 on [age]-year-old gentleman with a remote history of rectal cancer. He has stage II disease. He had neoadjuvant chemotherapy and radiation therapy.  He is clearly cured of this. However, he likes to come back to see [place] yearly. He just gets "peace of mind" with, to see [place].  As always, will see him back in 1 year.  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  Eric Napoleon, MD 1/4/20218:03 AM

## 2019-05-07 LAB — CEA (IN HOUSE-CHCC): CEA (CHCC-In House): 1.35 ng/mL (ref 0.00–5.00)

## 2019-06-03 ENCOUNTER — Other Ambulatory Visit: Payer: Self-pay | Admitting: Adult Health

## 2019-06-05 DIAGNOSIS — C44629 Squamous cell carcinoma of skin of left upper limb, including shoulder: Secondary | ICD-10-CM | POA: Diagnosis not present

## 2019-06-05 DIAGNOSIS — Z85828 Personal history of other malignant neoplasm of skin: Secondary | ICD-10-CM | POA: Diagnosis not present

## 2019-06-05 DIAGNOSIS — L308 Other specified dermatitis: Secondary | ICD-10-CM | POA: Diagnosis not present

## 2019-06-05 DIAGNOSIS — D485 Neoplasm of uncertain behavior of skin: Secondary | ICD-10-CM | POA: Diagnosis not present

## 2019-06-14 ENCOUNTER — Other Ambulatory Visit: Payer: Self-pay | Admitting: Internal Medicine

## 2019-06-18 ENCOUNTER — Other Ambulatory Visit: Payer: Self-pay | Admitting: Adult Health

## 2019-07-19 ENCOUNTER — Encounter: Payer: Self-pay | Admitting: Family Medicine

## 2019-07-19 ENCOUNTER — Ambulatory Visit (INDEPENDENT_AMBULATORY_CARE_PROVIDER_SITE_OTHER): Payer: Medicare HMO | Admitting: Family Medicine

## 2019-07-19 ENCOUNTER — Other Ambulatory Visit: Payer: Self-pay

## 2019-07-19 VITALS — BP 127/75 | HR 76 | Temp 97.5°F | Resp 12 | Ht 68.0 in | Wt 144.3 lb

## 2019-07-19 DIAGNOSIS — J4540 Moderate persistent asthma, uncomplicated: Secondary | ICD-10-CM

## 2019-07-19 DIAGNOSIS — L308 Other specified dermatitis: Secondary | ICD-10-CM | POA: Diagnosis not present

## 2019-07-19 DIAGNOSIS — C20 Malignant neoplasm of rectum: Secondary | ICD-10-CM | POA: Diagnosis not present

## 2019-07-19 DIAGNOSIS — I10 Essential (primary) hypertension: Secondary | ICD-10-CM | POA: Diagnosis not present

## 2019-07-19 DIAGNOSIS — L409 Psoriasis, unspecified: Secondary | ICD-10-CM | POA: Insufficient documentation

## 2019-07-19 DIAGNOSIS — E782 Mixed hyperlipidemia: Secondary | ICD-10-CM

## 2019-07-19 MED ORDER — AMLODIPINE BESYLATE 2.5 MG PO TABS: 2.5000 mg | ORAL_TABLET | Freq: Every day | ORAL | 0 refills | Status: DC

## 2019-07-19 MED ORDER — VALSARTAN-HYDROCHLOROTHIAZIDE 320-12.5 MG PO TABS: 1.0000 | ORAL_TABLET | Freq: Every day | ORAL | 0 refills | Status: DC

## 2019-07-19 MED ORDER — AMLODIPINE BESYLATE 2.5 MG PO TABS: 2.5000 mg | ORAL_TABLET | Freq: Every day | ORAL | Status: DC

## 2019-07-19 MED ORDER — BETAMETHASONE DIPROPIONATE 0.05 % EX CREA: TOPICAL_CREAM | CUTANEOUS | 0 refills | Status: DC

## 2019-07-19 MED ORDER — AMLODIPINE BESYLATE 5 MG PO TABS: 5.0000 mg | ORAL_TABLET | Freq: Every day | ORAL | 0 refills | Status: DC

## 2019-07-19 NOTE — Patient Instructions (Signed)
Please do not bang fence posts in any longer and please do not use chainsaw any longer.  Your goal blood pressure should be around 140/90 on a regular basis, or medications should be started/ modified.    It should not stay in the 150's-160's daily    Hypertension Hypertension, commonly called high blood pressure, is when the force of blood pumping through the arteries is too strong. The arteries are the blood vessels that carry blood from the heart throughout the body. Hypertension forces the heart to work harder to pump blood and may cause arteries to become narrow or stiff. Having untreated or uncontrolled hypertension can cause heart attacks, strokes, kidney disease, and other problems. A blood pressure reading consists of a higher number over a lower number. Ideally, your blood pressure should be below 120/80. The first ("top") number is called the systolic pressure. It is a measure of the pressure in your arteries as your heart beats. The second ("bottom") number is called the diastolic pressure. It is a measure of the pressure in your arteries as the heart relaxes. What are the causes? The cause of this condition is not known. What increases the risk? Some risk factors for high blood pressure are under your control. Others are not. Factors you can change  Smoking.  Having type 2 diabetes mellitus, high cholesterol, or both.  Not getting enough exercise or physical activity.  Being overweight.  Having too much fat, sugar, calories, or salt (sodium) in your diet.  Drinking too much alcohol. Factors that are difficult or impossible to change  Having chronic kidney disease.  Having a family history of high blood pressure.  Age. Risk increases with age.  Race. You may be at higher risk if you are African-American.  Gender. Men are at higher risk than women before age 25. After age 47, women are at higher risk than men.  Having obstructive sleep apnea.  Stress. What are the  signs or symptoms? Extremely high blood pressure (hypertensive crisis) may cause:  Headache.  Anxiety.  Shortness of breath.  Nosebleed.  Nausea and vomiting.  Severe chest pain.  Jerky movements you cannot control (seizures).  How is this diagnosed? This condition is diagnosed by measuring your blood pressure while you are seated, with your arm resting on a surface. The cuff of the blood pressure monitor will be placed directly against the skin of your upper arm at the level of your heart. It should be measured at least twice using the same arm. Certain conditions can cause a difference in blood pressure between your right and left arms. Certain factors can cause blood pressure readings to be lower or higher than normal (elevated) for a short period of time:  When your blood pressure is higher when you are in a health care provider's office than when you are at home, this is called white coat hypertension. Most people with this condition do not need medicines.  When your blood pressure is higher at home than when you are in a health care provider's office, this is called masked hypertension. Most people with this condition may need medicines to control blood pressure.  If you have a high blood pressure reading during one visit or you have normal blood pressure with other risk factors:  You may be asked to return on a different day to have your blood pressure checked again.  You may be asked to monitor your blood pressure at home for 1 week or longer.  If you are  diagnosed with hypertension, you may have other blood or imaging tests to help your health care provider understand your overall risk for other conditions. How is this treated? This condition is treated by making healthy lifestyle changes, such as eating healthy foods, exercising more, and reducing your alcohol intake. Your health care provider may prescribe medicine if lifestyle changes are not enough to get your blood  pressure under control, and if:  Your systolic blood pressure is above 130.  Your diastolic blood pressure is above 80.  Your personal target blood pressure may vary depending on your medical conditions, your age, and other factors. Follow these instructions at home: Eating and drinking  Eat a diet that is high in fiber and potassium, and low in sodium, added sugar, and fat. An example eating plan is called the DASH (Dietary Approaches to Stop Hypertension) diet. To eat this way: ? Eat plenty of fresh fruits and vegetables. Try to fill half of your plate at each meal with fruits and vegetables. ? Eat whole grains, such as whole wheat pasta, brown rice, or whole grain bread. Fill about one quarter of your plate with whole grains. ? Eat or drink low-fat dairy products, such as skim milk or low-fat yogurt. ? Avoid fatty cuts of meat, processed or cured meats, and poultry with skin. Fill about one quarter of your plate with lean proteins, such as fish, chicken without skin, beans, eggs, and tofu. ? Avoid premade and processed foods. These tend to be higher in sodium, added sugar, and fat.  Reduce your daily sodium intake. Most people with hypertension should eat less than 1,500 mg of sodium a day.  Limit alcohol intake to no more than 1 drink a day for nonpregnant women and 2 drinks a day for men. One drink equals 12 oz of beer, 5 oz of wine, or 1 oz of hard liquor. Lifestyle  Work with your health care provider to maintain a healthy body weight or to lose weight. Ask what an ideal weight is for you.  Get at least 30 minutes of exercise that causes your heart to beat faster (aerobic exercise) most days of the week. Activities may include walking, swimming, or biking.  Include exercise to strengthen your muscles (resistance exercise), such as pilates or lifting weights, as part of your weekly exercise routine. Try to do these types of exercises for 30 minutes at least 3 days a week.  Do not  use any products that contain nicotine or tobacco, such as cigarettes and e-cigarettes. If you need help quitting, ask your health care provider.  Monitor your blood pressure at home as told by your health care provider.  Keep all follow-up visits as told by your health care provider. This is important. Medicines  Take over-the-counter and prescription medicines only as told by your health care provider. Follow directions carefully. Blood pressure medicines must be taken as prescribed.  Do not skip doses of blood pressure medicine. Doing this puts you at risk for problems and can make the medicine less effective.  Ask your health care provider about side effects or reactions to medicines that you should watch for. Contact a health care provider if:  You think you are having a reaction to a medicine you are taking.  You have headaches that keep coming back (recurring).  You feel dizzy.  You have swelling in your ankles.  You have trouble with your vision. Get help right away if:  You develop a severe headache or confusion.  You have unusual weakness or numbness.  You feel faint.  You have severe pain in your chest or abdomen.  You vomit repeatedly.  You have trouble breathing. Summary  Hypertension is when the force of blood pumping through your arteries is too strong. If this condition is not controlled, it may put you at risk for serious complications.  Your personal target blood pressure may vary depending on your medical conditions, your age, and other factors. For most people, a normal blood pressure is less than 120/80.  Hypertension is treated with lifestyle changes, medicines, or a combination of both. Lifestyle changes include weight loss, eating a healthy, low-sodium diet, exercising more, and limiting alcohol. This information is not intended to replace advice given to you by your health care provider. Make sure you discuss any questions you have with your health  care provider. Document Released: 04/18/2005 Document Revised: 03/16/2016 Document Reviewed: 03/16/2016 Elsevier Interactive Patient Education  2018 [employer].    How to Take Your Blood Pressure   Blood pressure is a measurement of how strongly your blood is pressing against the walls of your arteries. Arteries are blood vessels that carry blood from your heart throughout your body. Your health care provider takes your blood pressure at each office visit. You can also take your own blood pressure at home with a blood pressure machine. You may need to take your own blood pressure:  To confirm a diagnosis of high blood pressure (hypertension).  To monitor your blood pressure over time.  To make sure your blood pressure medicine is working.  Supplies needed: To take your blood pressure, you will need a blood pressure machine. You can buy a blood pressure machine, or blood pressure monitor, at most drugstores or online. There are several types of home blood pressure monitors. When choosing one, consider the following:  Choose a monitor that has an arm cuff.  Choose a monitor that wraps snugly around your upper arm. You should be able to fit only one finger between your arm and the cuff.  Do not choose a monitor that measures your blood pressure from your wrist or finger.  Your health care provider can suggest a reliable monitor that will meet your needs. How to prepare To get the most accurate reading, avoid the following for 30 minutes before you check your blood pressure:  Drinking caffeine.  Drinking alcohol.  Eating.  Smoking.  Exercising.  Five minutes before you check your blood pressure:  Empty your bladder.  Sit quietly without talking in a dining chair, rather than in a soft couch or armchair.  How to take your blood pressure To check your blood pressure, follow the instructions in the manual that came with your blood pressure monitor. If you have a digital  blood pressure monitor, the instructions may be as follows: 1. Sit up straight. 2. Place your feet on the floor. Do not cross your ankles or legs. 3. Rest your left arm at the level of your heart on a table or desk or on the arm of a chair. 4. Pull up your shirt sleeve. 5. Wrap the blood pressure cuff around the upper part of your left arm, 1 inch (2.5 cm) above your elbow. It is best to wrap the cuff around bare skin. 6. Fit the cuff snugly around your arm. You should be able to place only one finger between the cuff and your arm. 7. Position the cord inside the groove of your elbow. 8. Press the power  button. 9. Sit quietly while the cuff inflates and deflates. 10. Read the digital reading on the monitor screen and write it down (record it). 11. Wait 2-3 minutes, then repeat the steps, starting at step 1.  What does my blood pressure reading mean? A blood pressure reading consists of a higher number over a lower number. Ideally, your blood pressure should be below 120/80. The first ("top") number is called the systolic pressure. It is a measure of the pressure in your arteries as your heart beats. The second ("bottom") number is called the diastolic pressure. It is a measure of the pressure in your arteries as the heart relaxes. Blood pressure is classified into four stages. The following are the stages for adults who do not have a short-term serious illness or a chronic condition. Systolic pressure and diastolic pressure are measured in a unit called mm Hg. Normal  Systolic pressure: below 123456.  Diastolic pressure: below 80. Elevated  Systolic pressure: [id number].  Diastolic pressure: below 80. Hypertension stage 1  Systolic pressure: 0000000.  Diastolic pressure: [id number]. Hypertension stage 2  Systolic pressure: [id number] or above.  Diastolic pressure: 90 or above. You can have prehypertension or hypertension even if only the systolic or only the diastolic number in your reading is  higher than normal. Follow these instructions at home:  Check your blood pressure as often as recommended by your health care provider.  Take your monitor to the next appointment with your health care provider to make sure: ? That you are using it correctly. ? That it provides accurate readings.  Be sure you understand what your goal blood pressure numbers are.  Tell your health care provider if you are having any side effects from blood pressure medicine. Contact a health care provider if:  Your blood pressure is consistently high. Get help right away if:  Your systolic blood pressure is higher than 180.  Your diastolic blood pressure is higher than 110. This information is not intended to replace advice given to you by your health care provider. Make sure you discuss any questions you have with your health care provider. Document Released: 09/25/2015 Document Revised: 12/08/2015 Document Reviewed: 09/25/2015 Elsevier Interactive Patient Education  [employer].

## 2019-07-19 NOTE — Progress Notes (Signed)
Of note, this is my first time meeting patient.  Patient is new to me and was previously being cared for at our office by Mina Marble, NP, who no longer works at primary care [employer].      Impression and Recommendations:    1. Essential hypertension   2. Hyperlipidemia, mixed   3. Chronic asthma, moderate persistent, uncomplicated   4. Psoriasiform dermatitis   5. Rectal cancer [facility])      Of note, this is my first time meeting patient.  Patient is new to me and was previously being cared for at our office by Mina Marble, NP, who no longer works at primary care [employer].   Essential Hypertension - Blood pressure currently elevated above goal. - Advised patient of goal BP of around 140/90 but not to stay in the [id number] systolically as his home blood pressure log has been indicating. ---> However upon recheck today, blood pressure stable and no changes to his medication dose was made -Upon recheck after sitting 10-15 minutes, patient's blood pressure was well controlled hence, we did not make adjustment and, patient desired to not cut his pill in half hence, 2.5 mg amlodipine was refilled.  See med list.  -I discussed with patient that at 86 years old, he will need to sit for 10 to 15 minutes prior to checking his blood pressure to give his body and cardiovascular system time after being active.  - Counseled patient on pathophysiology of disease and discussed various treatment options, which always includes dietary and lifestyle modification as first line.   - Lifestyle changes such as dash and heart healthy diets and engaging in a regular exercise program discussed extensively with patient.   - Ambulatory blood pressure monitoring encouraged at least 3 times weekly.  Advised patient to sit quietly before checking his blood pressure.  Keep log and bring in every office visit.  Reminded patient that if they ever feel poorly in any way, to check their blood pressure and  pulse.  - Handout provided to patient today.  - We will continue to monitor.  Breathing - Chronic Asthma - Monitored by pulmonology. - Patient is stable at this time overall. - Continue treatment as established.  See med list. - Management will continue per pulmonology.  - Patient will bring concerns to Dr. Melvyn Novas regarding shortness of breath with exercise. - Last pulmonary function test obtained in 2019.  - Reviewed red flag cardiac symptoms with patient today.  Patient knows to monitor himself for these symptoms and call 911 if they occur. - On review of patient's record, he has never seen a cardiologist or had a stress test on record.  - Encouraged patient to discontinue hard physical labor (banging fence posts, using chainsaw) if it is causing his breathing concerns to exacerbate.  - Health counseling performed.  All questions answered.  - Will continue to monitor alongside specialist Dr. Melvyn Novas.  Psoriasiform Dermatitis - Advised patient to obtain refills for his diprolene cream from dermatology. - Strongly encouraged patient to follow up with dermatology at least once yearly.  - Will continue to monitor alongside dermatology.  Mixed Hyperlipidemia - Last FLP January 2021, obtained by Dr. Marin Olp of Oncology:  Triglycerides = 67, WNL. HDL = 66, WNL. LDL = 128, elevated. --> His prior value was less than 100.  Hence due to age, he will try dietary and lifestyle modification prior to going on statin.  - To help reduce his LDL value,  encouraged patient to engage in prudent dietary habits, decreasing his intake of saturated fats and trans fats, reducing his intake of red meat to no more than once or twice per week, and engaging in increased aerobic activity.  - Will continue to monitor and re-check 6 months  Recommendations - Return in 3-4 months, sooner if blood pressure is off.   Meds ordered this encounter  Medications  . valsartan-hydrochlorothiazide (DIOVAN-HCT)  320-12.5 MG tablet    Sig: Take 1 tablet by mouth daily.    Dispense:  90 tablet    Refill:  0  . amLODipine (NORVASC) 2.5 MG tablet    Sig: Take 1 tablet (2.5 mg total) by mouth daily.    Dispense:  90 tablet    Refill:  0    Please see AVS handed out to patient at the end of our visit for further patient instructions/ counseling done pertaining to today's office visit.   Return for Hypertension follow up q 4 mo- inc amlidipine from 2.5 to 5mg  this OV.     Note:  This note was prepared with assistance of Dragon voice recognition software. Occasional wrong-word or sound-a-like substitutions may have occurred due to the inherent limitations of voice recognition software.   The [place] was signed into law in 2016 which includes the topic of electronic health records.  This provides immediate access to information in MyChart.  This includes consultation notes, operative notes, office notes, lab results and pathology reports.  If you have any questions about what you read please let us know at your next visit or call us at the office.  We are right here with you.   This case required medical decision making of at least moderate complexity.  This document serves as a record of services personally performed by Mellody Dance, DO. It was created on her behalf by Toni Amend, a trained medical scribe. The creation of this record is based on the scribe's personal observations and the provider's statements to them.   This case required medical decision making of at least moderate complexity. The above documentation from Toni Amend, medical scribe, has been reviewed by Marjory Sneddon, D.O.      --------------------------------------------------------------------------------------------------------------------------------------------------------------------------------------------------------------------------------------------    Subjective:     Phillips Odor, am serving as scribe for Dr.Mileena Rothenberger.   HPI: [name] is a 86 y.o. male who presents to [facility] [place] at [facility] today for issues as discussed below.  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  At times, he uses a chainsaw to engage in these activities.  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  - Chronic Asthma He sees Dr. Melvyn Novas for his breathing concerns.  Notes while working at times, due to his breathing, "I have to sit down, and it only takes a short period of time, and I'm okay."  He is fine while walking and does not experience these symptoms, but has SOB while engaging in the labor related to fence posts.  Denies chest pain, denies chest tightness, denies excessive sweating on exertion, denies shoulder pain or jaw pain, and denies nausea while engaging in activity.  Denies irregular heartbeat.  Per patient, has been to heart doctor in the past "and everything was ok there."  Notes it's been maybe two years since he went to the heart doctor.  For his breathing, states he  he only uses his Symbicort; does not use a rescue inhaler.  - Psoriasis Uses betamethasone diproprionate cream for psoriasis.  He was prescribed this cream in the past by Dr. Ronnald Ramp of dermatology.  Notes the psoriasis occurs in several places on his body, including his groin.  Notes that his psoriasis does not occur on his elbows.  - Oncological Care Follows up with Dr. Marin Olp for colon cancer.  HPI:  Hypertension:  -  His blood pressure at home has been running: says "they run high, not real high."  141/83, pulse 67. 155/80, pulse 65. 170/83, pulse 70. 166/80. 148/87.  Says sometimes they go higher than this, and then if he takes his BP an hour later, it's different.  "My blood pressure jumps around a lot."  Notes that he's had high blood pressure for forty years.  - Patient  reports good compliance with medication and/or lifestyle modification  - His denies acute concerns or problems related to treatment plan  - He denies new onset of: chest pain, exercise intolerance, shortness of breath, dizziness, visual changes, headache, lower extremity swelling or claudication.   Last 3 blood pressure readings in our office are as follows: BP Readings from Last 3 Encounters:  07/19/19 127/75  05/06/19 (!) 146/90  04/10/19 (!) 145/84   Filed Weights   07/19/19 0808  Weight: 144 lb 4.8 oz (65.5 kg)      Wt Readings from Last 3 Encounters:  07/19/19 144 lb 4.8 oz (65.5 kg)  05/06/19 143 lb (64.9 kg)  03/27/19 150 lb (68 kg)   BP Readings from Last 3 Encounters:  07/19/19 127/75  05/06/19 (!) 146/90  04/10/19 (!) 145/84   Pulse Readings from Last 3 Encounters:  07/19/19 76  05/06/19 84  04/10/19 64   BMI Readings from Last 3 Encounters:  07/19/19 21.94 kg/m  05/06/19 23.08 kg/m  03/27/19 24.21 kg/m     Patient Care Team    Relationship Specialty Notifications Start End  Mina Marble D, NP PCP - General Family Medicine  12/19/17   Leonard Downing, MD Referring Physician Family Medicine  12/19/17   Tanda Rockers, MD Consulting Physician Pulmonary Disease  12/19/17   Griselda Miner, MD Consulting Physician Dermatology  12/19/17   Ramonita Lab, Jovita Kussmaul, MD Referring Physician Ophthalmology  12/19/17   Danella Sensing, MD Consulting Physician Dermatology  07/19/19      Patient Active Problem List   Diagnosis Date Noted  . Psoriasiform dermatitis 07/19/2019  . Bronchitis 07/16/2018  . Healthcare maintenance 06/06/2018  . Elevated LDL cholesterol level 06/06/2018  . Aortic insufficiency 09/15/2017  . Chronic asthma, moderate persistent, uncomplicated 0000000  . Hyperlipidemia, mixed 05/06/2015  . Rectal cancer ([place]) 05/12/2011  . Essential hypertension 08/25/2009  . ASTHMA, UNSPECIFIED 08/25/2009  . DYSPNEA 08/25/2009    Past  Medical history, Surgical history, Family history, Social history, Allergies and Medications have been entered into the medical record, reviewed and changed as needed.    Current Meds  Medication Sig  . betamethasone dipropionate 0.05 % cream APPLY EVERY 12 HOURS TO AFFECTED AREA (RF per DERM)  . latanoprost (XALATAN) 0.005 % ophthalmic solution Place 1 drop into both eyes at bedtime.  . SYMBICORT 160-4.5 MCG/ACT inhaler TAKE 2 PUFFS BY MOUTH TWICE A DAY  . valsartan-hydrochlorothiazide (DIOVAN-HCT) 320-12.5 MG tablet Take 1 tablet by mouth daily.  . [DISCONTINUED] amLODipine (NORVASC) 2.5 MG tablet TAKE 1 TABLET BY MOUTH EVERY DAY  . [DISCONTINUED] betamethasone dipropionate (DIPROLENE) 0.05 % cream  APPLY EVERY 12 HOURS TO AFFECTED AREA FOR PSORIASIS  . [DISCONTINUED] valsartan-hydrochlorothiazide (DIOVAN-HCT) 320-12.5 MG tablet TAKE 1 TABLET BY MOUTH EVERY DAY   Current Facility-Administered Medications for the 07/19/19 encounter (Office Visit) with Mellody Dance, DO  Medication  . ipratropium-albuterol (DUONEB) 0.5-2.5 (3) MG/3ML nebulizer solution 3 mL    Allergies:  No Known Allergies   Review of Systems:  A fourteen system review of systems was performed and found to be positive as per HPI.   Objective:   Blood pressure 127/75, pulse 76, temperature (!) 97.5 F (36.4 C), temperature source Oral, resp. rate 12, height 5\' 8"  (1.727 m), weight 144 lb 4.8 oz (65.5 kg), SpO2 96 %. Body mass index is 21.94 kg/m. General:  Well Developed, well nourished, appropriate for stated age.  Neuro:  Alert and oriented,  extra-ocular muscles intact  HEENT:  Normocephalic, atraumatic, neck supple, no carotid bruits appreciated  Skin:  no gross rash, warm, pink. Cardiac:  RRR, S1 S2 Respiratory:  ECTA B/L and A/P, Not using accessory muscles, speaking in full sentences- unlabored. Vascular:  Ext warm, no cyanosis apprec.; cap RF less 2 sec. Psych:  No HI/SI, judgement and insight good,  Euthymic mood. Full Affect.

## 2019-08-13 DIAGNOSIS — R69 Illness, unspecified: Secondary | ICD-10-CM | POA: Diagnosis not present

## 2019-08-21 ENCOUNTER — Other Ambulatory Visit: Payer: Self-pay | Admitting: Internal Medicine

## 2019-09-17 ENCOUNTER — Other Ambulatory Visit: Payer: Self-pay | Admitting: Internal Medicine

## 2019-09-27 IMAGING — DX DG CHEST 2V
2 series · 2 of 2 positions shown · non-contrast
Comparison: 08/25/2009

CLINICAL DATA: Dyspnea on exertion

EXAM:
CHEST - 2 VIEW

[dg chest 2 view (1 of 2)]
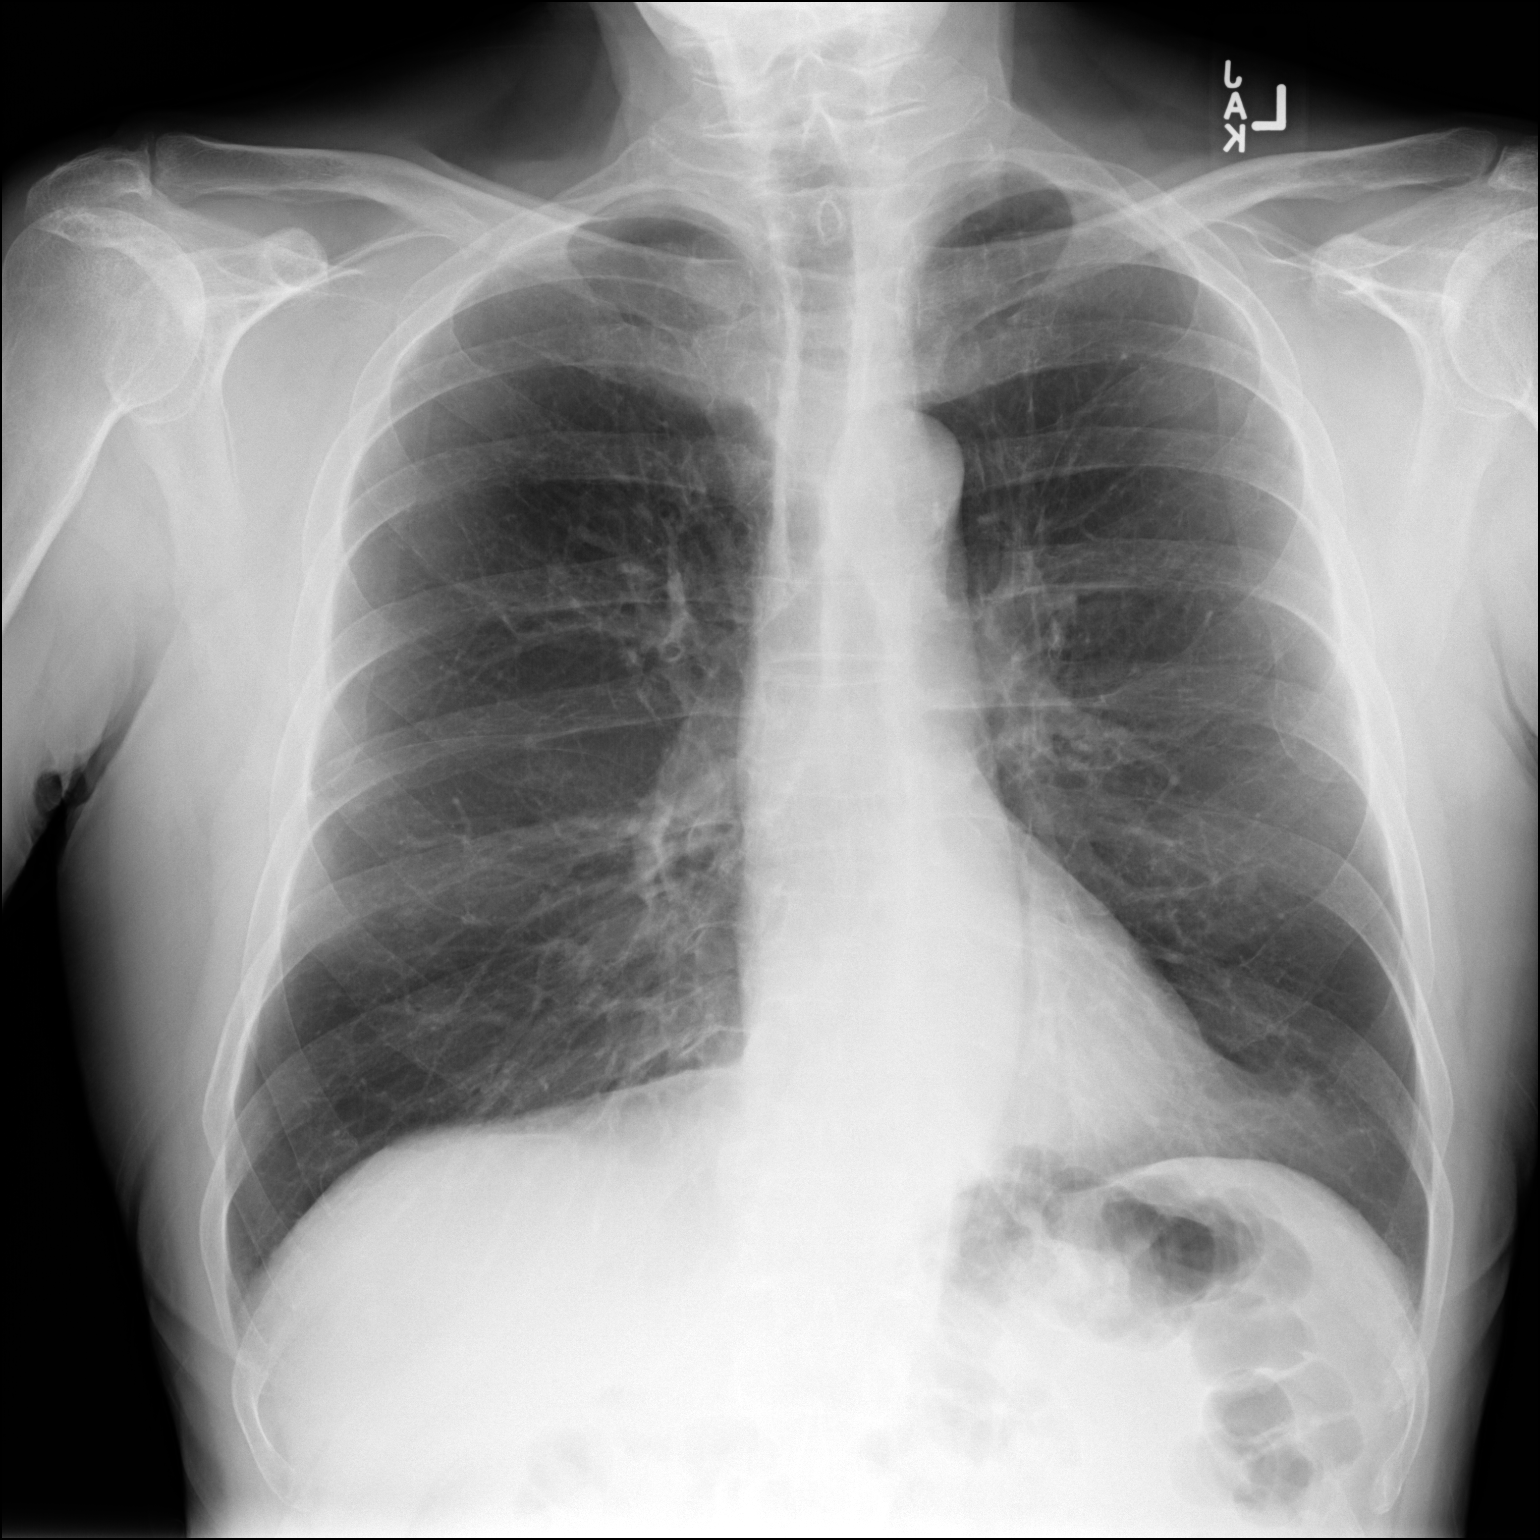

[dg chest 2 view (2 of 2)]
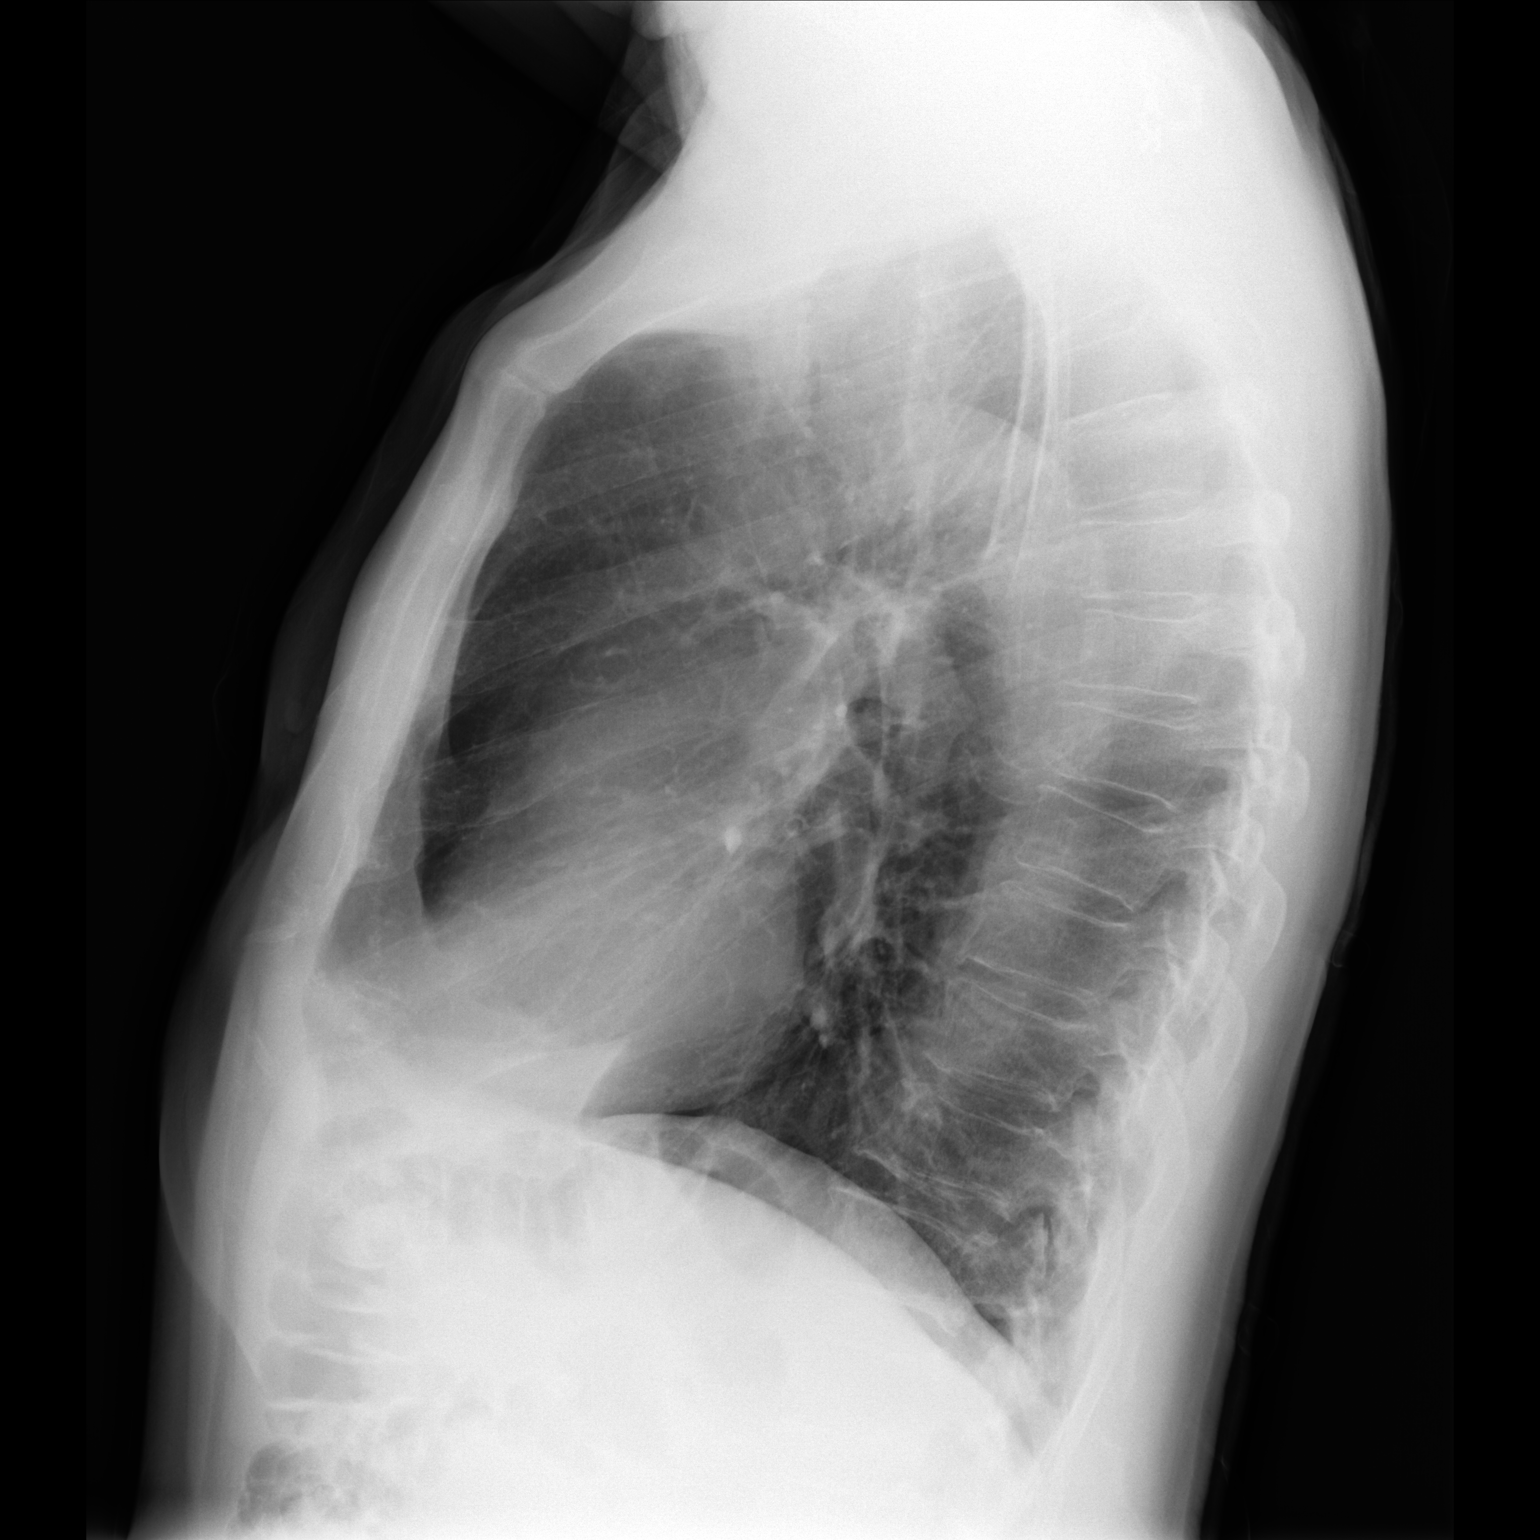

[2 of 2 positions shown; findings below may reference images not displayed]

FINDINGS: Hyperinflation. No focal opacity or pleural effusion. Normal
cardiomediastinal silhouette with aortic atherosclerosis. No
pneumothorax.
IMPRESSION: No active cardiopulmonary disease.

## 2019-10-21 DIAGNOSIS — H52223 Regular astigmatism, bilateral: Secondary | ICD-10-CM | POA: Diagnosis not present

## 2019-11-20 ENCOUNTER — Other Ambulatory Visit: Payer: Self-pay

## 2019-11-20 ENCOUNTER — Ambulatory Visit (INDEPENDENT_AMBULATORY_CARE_PROVIDER_SITE_OTHER): Payer: Medicare HMO | Admitting: Physician Assistant

## 2019-11-20 ENCOUNTER — Encounter: Payer: Self-pay | Admitting: Physician Assistant

## 2019-11-20 VITALS — BP 157/74 | HR 70 | Temp 97.7°F | Ht 68.0 in | Wt 150.8 lb

## 2019-11-20 DIAGNOSIS — I10 Essential (primary) hypertension: Secondary | ICD-10-CM | POA: Diagnosis not present

## 2019-11-20 DIAGNOSIS — E782 Mixed hyperlipidemia: Secondary | ICD-10-CM | POA: Diagnosis not present

## 2019-11-20 NOTE — Assessment & Plan Note (Addendum)
-   BP today is 159/77 HR 70, BP recheck 157/74. - Continue amlodipine 2.5 mg and valsartan-HCTZ 320-12.5 mg. -Due to history of hypotensive episodes instructed patient to check BP 2-3 hours after taking antihypertensive medications x 2 wks and keep a log to drop off at the office. If BP remains consistently above 140/90 then will increase amlodipine dose to 5 mg.  Patient verbalized understanding and agreeable. - Recommend to increase water use and reduce alcohol to no more than 2 drinks per day. - Continue DASH diet. - Continue to stay as active as possible. - Checking CMP today for medication monitoring.

## 2019-11-20 NOTE — Assessment & Plan Note (Signed)
-   Last lipid panel: Total cholesterol and LDL elevated. - Continue heart healthy diet. -Rechecking lipid panel today.

## 2019-11-20 NOTE — Progress Notes (Signed)
Established Patient Office Visit  Subjective:  Patient ID: [name], male    DOB: April 11, 1931  Age: 86 y.o. MRN: [id number]  CC:  Chief Complaint  Patient presents with  . Hypertension    HPI [name] presents for follow-up on hypertension.  HTN: Pt denies chest pain, palpitations, dizziness or lower extremity swelling. Taking medication as directed without side effects. Checks BP at home and brings BP log.  BP readings vary from 128/70 to 160/82, HR range 65-70.  Patient reports in May he had episodes where his blood pressure was running low and self discontinued his medications, and restarted once his blood pressure started to trend up again.  Patient reports poor water hydration and drinks 2-6 alcoholic beverages. Pt follows a low salt diet.   Past Medical History:  Diagnosis Date  . Asthma   . Depression    wife died 2 months ago. 04/16/11  . Eyelid cancer   . Hyperlipidemia, mixed 05/06/2015  . Hypertension   . Rectal cancer ([place]) 1999    Past Surgical History:  Procedure Laterality Date  . COLON RESECTION  1999  . cyst eyelid  2012  . HAND TUMOR EXCISION  2010   right    Family History  Problem Relation Age of Onset  . Hypertension Father   . Stroke Father   . Suicidality Brother   . Colon cancer Neg Hx   . Rectal cancer Neg Hx     Social History   Socioeconomic History  . Marital status: Married    Spouse name: Not on file  . Number of children: Not on file  . Years of education: Not on file  . Highest education level: Not on file  Occupational History  . Not on file  Tobacco Use  . Smoking status: Never Smoker  . Smokeless tobacco: Never Used  . Tobacco comment: Never Used Tobacco  Vaping Use  . Vaping Use: Never used  Substance and Sexual Activity  . Alcohol use: Yes    Alcohol/week: 16.0 standard drinks    Types: 16 Cans of beer per week  . Drug use: No  . Sexual activity: Not Currently  Other Topics Concern  . Not on  file  Social History Narrative  . Not on file   Social Determinants of Health   Financial Resource Strain:   . Difficulty of Paying Living Expenses:   Food Insecurity:   . Worried About Charity fundraiser in the Last Year:   . Arboriculturist in the Last Year:   Transportation Needs:   . Film/video editor (Medical):   Marland Kitchen Lack of Transportation (Non-Medical):   Physical Activity:   . Days of Exercise per Week:   . Minutes of Exercise per Session:   Stress:   . Feeling of Stress :   Social Connections:   . Frequency of Communication with Friends and Family:   . Frequency of Social Gatherings with Friends and Family:   . Attends Religious Services:   . Active Member of Clubs or Organizations:   . Attends Archivist Meetings:   Marland Kitchen Marital Status:   Intimate Partner Violence:   . Fear of Current or Ex-Partner:   . Emotionally Abused:   Marland Kitchen Physically Abused:   . Sexually Abused:     Outpatient Medications Prior to Visit  Medication Sig Dispense Refill  . amLODipine (NORVASC) 2.5 MG tablet Take 1 tablet (2.5 mg total) by mouth daily.  90 tablet 0  . betamethasone dipropionate 0.05 % cream APPLY EVERY 12 HOURS TO AFFECTED AREA (RF per DERM) 45 g 0  . latanoprost (XALATAN) 0.005 % ophthalmic solution Place 1 drop into both eyes at bedtime.    . SYMBICORT 160-4.5 MCG/ACT inhaler TAKE 2 PUFFS BY MOUTH TWICE A DAY 10.2 Inhaler 0  . valsartan-hydrochlorothiazide (DIOVAN-HCT) 320-12.5 MG tablet Take 1 tablet by mouth daily. 90 tablet 0   Facility-Administered Medications Prior to Visit  Medication Dose Route Frequency Provider Last Rate Last Admin  . ipratropium-albuterol (DUONEB) 0.5-2.5 (3) MG/3ML nebulizer solution 3 mL  3 mL Nebulization Q6H Danford, Katy D, NP   3 mL at 07/16/18 0924    No Known Allergies  ROS Review of Systems  A fourteen system review of systems was performed and found to be positive as per HPI.  Objective:    Physical Exam General: Well  nourished, in no apparent distress. Eyes: PERRLA, EOMs, conjunctiva clr Resp: Respiratory effort- normal, ECTA B/L w/o W/R/R  Cardio: RRR S1 S2 without murmur Abdomen: no gross distention. Lymphatics:  less 2 sec cap RF, no gross edema M-sk: Full ROM, good strength, normal gait.  Skin: Warm, dry  Neuro: Alert, Oriented, no focal deficits Psych: Normal affect, Insight and Judgment appropriate.   BP (!) 157/74   Pulse 70   Temp 97.7 F (36.5 C) (Oral)   Ht 5\' 8"  (1.727 m)   Wt 150 lb 12.8 oz (68.4 kg)   SpO2 97% Comment: on RA  BMI 22.93 kg/m  Wt Readings from Last 3 Encounters:  11/20/19 150 lb 12.8 oz (68.4 kg)  07/19/19 144 lb 4.8 oz (65.5 kg)  05/06/19 143 lb (64.9 kg)     Health Maintenance Due  Topic Date Due  . COVID-19 Vaccine (1) Never done  . TETANUS/TDAP  Never done  . PNA vac Low Risk Adult (2 of 2 - PPSV23) 11/06/2014    There are no preventive care reminders to display for this patient.  Lab Results  Component Value Date   TSH 2.030 12/25/2018   Lab Results  Component Value Date   WBC 7.1 05/06/2019   HGB 15.3 05/06/2019   HCT 43.0 05/06/2019   MCV 89.0 05/06/2019   PLT 210 05/06/2019   Lab Results  Component Value Date   NA 130 (L) 05/06/2019   K 4.5 05/06/2019   CHLORIDE 103 05/05/2016   CO2 28 05/06/2019   GLUCOSE 108 (H) 05/06/2019   BUN 13 05/06/2019   CREATININE 0.92 05/06/2019   BILITOT 1.0 05/06/2019   ALKPHOS 62 05/06/2019   AST 33 05/06/2019   ALT 50 (H) 05/06/2019   PROT 7.6 05/06/2019   ALBUMIN 4.5 05/06/2019   CALCIUM 10.2 05/06/2019   ANIONGAP 9 05/06/2019   EGFR 71 (L) 05/05/2016   Lab Results  Component Value Date   CHOL 207 (H) 05/06/2019   Lab Results  Component Value Date   HDL 66 05/06/2019   Lab Results  Component Value Date   LDLCALC 128 (H) 05/06/2019   Lab Results  Component Value Date   TRIG 67 05/06/2019   Lab Results  Component Value Date   CHOLHDL 3.1 05/06/2019   Lab Results  Component  Value Date   HGBA1C 5.5 12/25/2018      Assessment & Plan:   Problem List Items Addressed This Visit      Cardiovascular and Mediastinum   Essential hypertension    - BP today is 159/77 HR 70,  BP recheck 157/74. - Continue amlodipine 2.5 mg and valsartan-HCTZ 320-12.5 mg. -Due to history of hypotensive episodes instructed patient to check BP 2-3 hours after taking antihypertensive medications x 2 wks and keep a log to drop off at the office. If BP remains consistently above 140/90 then will increase amlodipine dose to 5 mg.  Patient verbalized understanding and agreeable. - Recommend to increase water use and reduce alcohol to no more than 2 drinks per day. - Continue DASH diet. - Continue to stay as active as possible. - Checking CMP today for medication monitoring.       Relevant Orders   Comp Met (CMET)     Other   Hyperlipidemia, mixed - Primary (Chronic)    - Last lipid panel: Total cholesterol and LDL elevated. - Continue heart healthy diet. -Rechecking lipid panel today.      Relevant Orders   Lipid Profile      No orders of the defined types were placed in this encounter.   Follow-up: Return in about 4 months (around 03/22/2020) for HTN, HLD, Chronic asthma.    Lorrene Reid, PA-C

## 2019-11-20 NOTE — Patient Instructions (Signed)

## 2019-11-21 LAB — COMPREHENSIVE METABOLIC PANEL
ALT: 80 IU/L — ABNORMAL HIGH (ref 0–44)
AST: 61 IU/L — ABNORMAL HIGH (ref 0–40)
Albumin/Globulin Ratio: 1.7 (ref 1.2–2.2)
Albumin: 4.3 g/dL (ref 3.6–4.6)
Alkaline Phosphatase: 70 IU/L (ref 48–121)
BUN/Creatinine Ratio: 10 (ref 10–24)
BUN: 7 mg/dL — ABNORMAL LOW (ref 8–27)
Bilirubin Total: 0.6 mg/dL (ref 0.0–1.2)
CO2: 23 mmol/L (ref 20–29)
Calcium: 9.4 mg/dL (ref 8.6–10.2)
Chloride: 90 mmol/L — ABNORMAL LOW (ref 96–106)
Creatinine, Ser: 0.73 mg/dL — ABNORMAL LOW (ref 0.76–1.27)
GFR calc Af Amer: 97 mL/min/{1.73_m2} (ref >59)
GFR calc non Af Amer: 84 mL/min/{1.73_m2} (ref >59)
Globulin, Total: 2.6 g/dL (ref 1.5–4.5)
Glucose: 101 mg/dL — ABNORMAL HIGH (ref 65–99)
Potassium: 4.6 mmol/L (ref 3.5–5.2)
Sodium: 128 mmol/L — ABNORMAL LOW (ref 134–144)
Total Protein: 6.9 g/dL (ref 6.0–8.5)

## 2019-11-21 LAB — LIPID PANEL
Chol/HDL Ratio: 2.2 ratio (ref 0.0–5.0)
Cholesterol, Total: 162 mg/dL (ref 100–199)
HDL: 73 mg/dL (ref >39)
LDL Chol Calc (NIH): 79 mg/dL (ref 0–99)
Triglycerides: 47 mg/dL (ref 0–149)
VLDL Cholesterol Cal: 10 mg/dL (ref 5–40)

## 2019-12-03 DIAGNOSIS — Z85828 Personal history of other malignant neoplasm of skin: Secondary | ICD-10-CM | POA: Diagnosis not present

## 2019-12-03 DIAGNOSIS — D485 Neoplasm of uncertain behavior of skin: Secondary | ICD-10-CM | POA: Diagnosis not present

## 2019-12-03 DIAGNOSIS — D692 Other nonthrombocytopenic purpura: Secondary | ICD-10-CM | POA: Diagnosis not present

## 2019-12-03 DIAGNOSIS — C44622 Squamous cell carcinoma of skin of right upper limb, including shoulder: Secondary | ICD-10-CM | POA: Diagnosis not present

## 2019-12-03 DIAGNOSIS — L570 Actinic keratosis: Secondary | ICD-10-CM | POA: Diagnosis not present

## 2019-12-03 DIAGNOSIS — D1801 Hemangioma of skin and subcutaneous tissue: Secondary | ICD-10-CM | POA: Diagnosis not present

## 2019-12-16 ENCOUNTER — Telehealth: Payer: Self-pay | Admitting: Physician Assistant

## 2019-12-16 DIAGNOSIS — I10 Essential (primary) hypertension: Secondary | ICD-10-CM

## 2019-12-16 MED ORDER — VALSARTAN-HYDROCHLOROTHIAZIDE 320-12.5 MG PO TABS: 1.0000 | ORAL_TABLET | Freq: Every day | ORAL | 0 refills | Status: DC

## 2019-12-16 NOTE — Telephone Encounter (Signed)
LVM informing pt that RX was sent to pharmacy.  Charyl Bigger, CMA

## 2019-12-16 NOTE — Telephone Encounter (Signed)
Patient is requesting a refill of his valsartan-HCTZ, if approved please send to [facility] on [address].

## 2019-12-16 NOTE — Addendum Note (Signed)
Addended by: Fonnie Mu on: 12/16/2019 02:47 PM   Modules accepted: Orders

## 2020-01-01 ENCOUNTER — Telehealth: Payer: Self-pay | Admitting: Physician Assistant

## 2020-01-01 ENCOUNTER — Other Ambulatory Visit: Payer: Self-pay | Admitting: Family Medicine

## 2020-01-01 DIAGNOSIS — I10 Essential (primary) hypertension: Secondary | ICD-10-CM

## 2020-01-01 MED ORDER — AMLODIPINE BESYLATE 2.5 MG PO TABS: 2.5000 mg | ORAL_TABLET | Freq: Every day | ORAL | 0 refills | Status: DC

## 2020-01-01 NOTE — Telephone Encounter (Signed)
Patient is requesting a refill of his amlodipine, if approved please send to [facility] on [address].

## 2020-01-01 NOTE — Addendum Note (Signed)
Addended by: Mickel Crow on: 01/01/2020 03:33 PM   Modules accepted: Orders

## 2020-01-01 NOTE — Telephone Encounter (Signed)
Med sent to requested pharmacy. AS< CMA

## 2020-01-23 ENCOUNTER — Telehealth: Payer: Self-pay | Admitting: Internal Medicine

## 2020-01-23 DIAGNOSIS — J4540 Moderate persistent asthma, uncomplicated: Secondary | ICD-10-CM

## 2020-01-23 NOTE — Telephone Encounter (Signed)
Left a voicemail for patient to call our office back regarding refill's on medication. Patient needs to make a f/u appt

## 2020-01-24 MED ORDER — BUDESONIDE-FORMOTEROL FUMARATE 160-4.5 MCG/ACT IN AERO: INHALATION_SPRAY | RESPIRATORY_TRACT | 2 refills | Status: DC

## 2020-01-24 NOTE — Telephone Encounter (Signed)
Spoke with patient regarding prior message.Advised patient he would need to make a f/u appt with Dr.wert . Made a f/u appt 02/28/20 at 8:45 with Dr.Wert and was able to send in medication refill. Advised patient to keep appt and bring all medication's with patient to be able to get refill's. Patient's voice was understanding. Nothing else further needed.

## 2020-01-27 DIAGNOSIS — R69 Illness, unspecified: Secondary | ICD-10-CM | POA: Diagnosis not present

## 2020-02-24 DIAGNOSIS — R69 Illness, unspecified: Secondary | ICD-10-CM | POA: Diagnosis not present

## 2020-02-28 ENCOUNTER — Encounter: Payer: Self-pay | Admitting: Internal Medicine

## 2020-02-28 ENCOUNTER — Ambulatory Visit: Payer: Medicare HMO | Admitting: Internal Medicine

## 2020-02-28 ENCOUNTER — Other Ambulatory Visit: Payer: Self-pay

## 2020-02-28 DIAGNOSIS — J4540 Moderate persistent asthma, uncomplicated: Secondary | ICD-10-CM | POA: Diagnosis not present

## 2020-02-28 MED ORDER — BUDESONIDE-FORMOTEROL FUMARATE 160-4.5 MCG/ACT IN AERO: INHALATION_SPRAY | RESPIRATORY_TRACT | 11 refills | Status: DC

## 2020-02-28 NOTE — Assessment & Plan Note (Signed)
PFT's  10/06/09   FEV1 2.56 (100 % ) ratio 51  p no % improvement from saba p ? prior to study with DLCO  116%  - PFT's  10/30/2017  FEV1 1.80 (39 % ) ratio 55   p 39 % improvement from saba p nothing prior to study with DLCO  72 % corrects to 89 % for alv volume   - 10/30/2017  symbicort 160   2 bid - 02/01/2018  After extensive coaching inhaler device,  effectiveness =    75% from baseline 25% (very late insp p trigger, short Ti) - Spirometry 02/01/2018  FEV1 1.3 (59%)  Ratio 56 p am symb 160 with poor hfa   - Allergy profile 02/01/2018 >  Eos 0.3/  IgE  35 RAST neg  - Alpha one AT rec 05/07/2018  > declined  - 02/28/2020  After extensive coaching inhaler device,  effectiveness =    50% (delayed trigger, good Ti)  > rx spacer   He should do much better with consistent use of symb 160 but needs to learn to pace himself at age 86 to where he is at a submaximal level where he can sustain 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  Total time for H and P, chart review, counseling, teaching device and generating customized AVS unique to this office visit / charting = 59min

## 2020-02-28 NOTE — Patient Instructions (Signed)
Continue symbicort 160 Take 2 puffs first thing in am and then another 2 puffs about 12 hours later.     Work on inhaler technique:  relax and gently blow all the way out then take a nice smooth deep breath back in, triggering the inhaler at same time you start breathing in.  Hold for up to 5 seconds if you can. Blow out thru nose. Rinse and gargle with water when done  If can't master the push and breathe at same time, ok to use spacer  and continue push then breathe in    Please schedule a follow up visit in 12 months but call sooner if needed    .

## 2020-02-28 NOTE — Progress Notes (Signed)
Subjective:     Patient ID: [name], male   DOB: Jul 03, 1930,    MRN: [id number]    Brief patient profile:  73 yowm never smoker with hx suggestive of longstanding asthma and  h/o cough attributed to to gerd and cc  variable sob/wheeze  for which inhalers seem  to help referred to pulmonary clinic 09/14/2017 by Dr Arelia Sneddon with worse noct wheeze ? since starting ACEi and refractory to symb  160 dosed at 2bid though baseline hfa technique suboptimal.     History of Present Illness  09/14/2017 1st [facility] office visit/ [name]   Chief Complaint  Patient presents with  . Consult    SOB with excertion. wheezing at night.   doe x decades worse with fence post splitting and rapid walking assoc with noct wheeze while on ACEi and better with  use of Symbicort esp at bedtime but also used with "wheezing"  Am of ov (w/in 12 hours of last symb 160  dose)  rec Symbicort 160 can be used up to 2 puffs every 12 hours Work on inhaler technique:   Stop lisinopril -hctz and start diovan 160-25 mg daily - call if problems  Please schedule a follow up office visit in 6 weeks, call sooner if needed with all medications /inhalers/ solutions in hand so we can verify exactly what you are taking. This includes all medications from all doctors and over the counters  - PFTs on return    02/28/2020  f/u ov/[name] re: mod asthma on symbicort 160 2bid with poor baseline hfa Chief Complaint  Patient presents with  . Follow-up    No complaints  Dyspnea:  MMRC1 = can walk nl pace, flat grade, can't hurry or go uphills or steps s sob   Cough: none Sleeping: electric bed is stuck at 30 degrees one pillow  SABA use: symbicort 160 never uses saba  02: none    No obvious day to day or daytime variability or assoc excess/ purulent sputum or mucus plugs or hemoptysis or cp or chest tightness, subjective wheeze or overt sinus or hb symptoms.   Sleeping  without nocturnal  or early am exacerbation  of  respiratory  c/o's or need for noct saba. Also denies any obvious fluctuation of symptoms with weather or environmental changes or other aggravating or alleviating factors except as outlined above   No unusual exposure hx or h/o childhood pna/ asthma or knowledge of premature birth.  Current Allergies, Complete Past Medical History, Past Surgical History, Family History, and Social History were reviewed in [employer] record.  ROS  The following are not active complaints unless bolded Hoarseness, sore throat, dysphagia, dental problems, itching, sneezing,  nasal congestion or discharge of excess mucus or purulent secretions, ear ache,   fever, chills, sweats, unintended wt loss or wt gain, classically pleuritic or exertional cp,  orthopnea pnd or arm/hand swelling  or leg swelling, presyncope, palpitations, abdominal pain, anorexia, nausea, vomiting, diarrhea  or change in bowel habits or change in bladder habits, change in stools or change in urine, dysuria, hematuria,  rash, arthralgias, visual complaints, headache, numbness, weakness or ataxia or problems with walking or coordination,  change in mood or  memory.        Current Meds  Medication Sig  . amLODipine (NORVASC) 2.5 MG tablet Take 1 tablet (2.5 mg total) by mouth daily.  . betamethasone dipropionate 0.05 % cream APPLY EVERY 12 HOURS TO AFFECTED AREA (RF per DERM)  .  budesonide-formoterol (SYMBICORT) 160-4.5 MCG/ACT inhaler TAKE 2 PUFFS BY MOUTH TWICE A DAY  . latanoprost (XALATAN) 0.005 % ophthalmic solution Place 1 drop into both eyes at bedtime.  . valsartan-hydrochlorothiazide (DIOVAN-HCT) 320-12.5 MG tablet Take 1 tablet by mouth daily.   Current Facility-Administered Medications for the 02/28/20 encounter (Office Visit) with Tanda Rockers, MD  Medication  . ipratropium-albuterol (DUONEB) 0.5-2.5 (3) MG/3ML nebulizer solution 3 mL                    Objective:   Physical Exam     02/28/2020      146  05/07/2018          152 02/01/2018        148   10/30/2017         143   09/14/17 145 lb 9.6 oz (66 kg)  05/04/17 139 lb 8 oz (63.3 kg)  05/05/16 144 lb (65.3 kg)      Vital signs reviewed  02/28/2020  - Note at rest 02 sats  96% on RA        LUNGS: no acc muscle use,  Mild barrel  contour chest wall        Assessment:

## 2020-03-09 ENCOUNTER — Other Ambulatory Visit: Payer: Self-pay | Admitting: Physician Assistant

## 2020-03-09 DIAGNOSIS — I10 Essential (primary) hypertension: Secondary | ICD-10-CM

## 2020-03-23 ENCOUNTER — Other Ambulatory Visit: Payer: Medicare HMO

## 2020-03-25 ENCOUNTER — Other Ambulatory Visit: Payer: Self-pay

## 2020-03-25 ENCOUNTER — Telehealth: Payer: Self-pay | Admitting: Physician Assistant

## 2020-03-25 ENCOUNTER — Encounter: Payer: Self-pay | Admitting: Physician Assistant

## 2020-03-25 ENCOUNTER — Ambulatory Visit (INDEPENDENT_AMBULATORY_CARE_PROVIDER_SITE_OTHER): Payer: Medicare HMO | Admitting: Physician Assistant

## 2020-03-25 VITALS — BP 161/93 | HR 69 | Ht 67.0 in | Wt 142.8 lb

## 2020-03-25 DIAGNOSIS — I10 Essential (primary) hypertension: Secondary | ICD-10-CM | POA: Diagnosis not present

## 2020-03-25 DIAGNOSIS — E782 Mixed hyperlipidemia: Secondary | ICD-10-CM | POA: Diagnosis not present

## 2020-03-25 MED ORDER — AMLODIPINE BESYLATE 2.5 MG PO TABS: 5.0000 mg | ORAL_TABLET | Freq: Every day | ORAL | 0 refills | Status: DC

## 2020-03-25 NOTE — Assessment & Plan Note (Signed)
-  Last lipid panel: total cholesterol 162, triglycerides 47, HDL 73, LDL 79 -Recommend to continue with a diet low in saturated and trans fats. -Continue to stay active.  -Will continue to monitor.

## 2020-03-25 NOTE — Patient Instructions (Signed)
DASH Eating Plan DASH stands for "Dietary Approaches to Stop Hypertension." The DASH eating plan is a healthy eating plan that has been shown to reduce high blood pressure (hypertension). It may also reduce your risk for type 2 diabetes, heart disease, and stroke. The DASH eating plan may also help with weight loss. What are tips for following this plan?  General guidelines  Avoid eating more than 2,300 mg (milligrams) of salt (sodium) a day. If you have hypertension, you may need to reduce your sodium intake to 1,500 mg a day.  Limit alcohol intake to no more than 1 drink a day for nonpregnant women and 2 drinks a day for men. One drink equals 12 oz of beer, 5 oz of wine, or 1 oz of hard liquor.  Work with your health care provider to maintain a healthy body weight or to lose weight. Ask what an ideal weight is for you.  Get at least 30 minutes of exercise that causes your heart to beat faster (aerobic exercise) most days of the week. Activities may include walking, swimming, or biking.  Work with your health care provider or diet and nutrition specialist (dietitian) to adjust your eating plan to your individual calorie needs. Reading food labels   Check food labels for the amount of sodium per serving. Choose foods with less than 5 percent of the Daily Value of sodium. Generally, foods with less than 300 mg of sodium per serving fit into this eating plan.  To find whole grains, look for the word "whole" as the first word in the ingredient list. Shopping  Buy products labeled as "low-sodium" or "no salt added."  Buy fresh foods. Avoid canned foods and premade or frozen meals. Cooking  Avoid adding salt when cooking. Use salt-free seasonings or herbs instead of table salt or sea salt. Check with your health care provider or pharmacist before using salt substitutes.  Do not fry foods. Cook foods using healthy methods such as baking, boiling, grilling, and broiling instead.  Cook with  heart-healthy oils, such as olive, canola, soybean, or sunflower oil. Meal planning  Eat a balanced diet that includes: ? 5 or more servings of fruits and vegetables each day. At each meal, try to fill half of your plate with fruits and vegetables. ? Up to 6-8 servings of whole grains each day. ? Less than 6 oz of lean meat, poultry, or fish each day. A 3-oz serving of meat is about the same size as a deck of cards. One egg equals 1 oz. ? 2 servings of low-fat dairy each day. ? A serving of nuts, seeds, or beans 5 times each week. ? Heart-healthy fats. Healthy fats called Omega-3 fatty acids are found in foods such as flaxseeds and coldwater fish, like sardines, salmon, and mackerel.  Limit how much you eat of the following: ? Canned or prepackaged foods. ? Food that is high in trans fat, such as fried foods. ? Food that is high in saturated fat, such as fatty meat. ? Sweets, desserts, sugary drinks, and other foods with added sugar. ? Full-fat dairy products.  Do not salt foods before eating.  Try to eat at least 2 vegetarian meals each week.  Eat more home-cooked food and less restaurant, buffet, and fast food.  When eating at a restaurant, ask that your food be prepared with less salt or no salt, if possible. What foods are recommended? The items listed may not be a complete list. Talk with your dietitian about   what dietary choices are best for you. Grains Whole-grain or whole-wheat bread. Whole-grain or whole-wheat pasta. Brown rice. Oatmeal. Quinoa. Bulgur. Whole-grain and low-sodium cereals. Pita bread. Low-fat, low-sodium crackers. Whole-wheat flour tortillas. Vegetables Fresh or frozen vegetables (raw, steamed, roasted, or grilled). Low-sodium or reduced-sodium tomato and vegetable juice. Low-sodium or reduced-sodium tomato sauce and tomato paste. Low-sodium or reduced-sodium canned vegetables. Fruits All fresh, dried, or frozen fruit. Canned fruit in natural juice (without  added sugar). Meat and other protein foods Skinless chicken or turkey. Ground chicken or turkey. Pork with fat trimmed off. Fish and seafood. Egg whites. Dried beans, peas, or lentils. Unsalted nuts, nut butters, and seeds. Unsalted canned beans. Lean cuts of beef with fat trimmed off. Low-sodium, lean deli meat. Dairy Low-fat (1%) or fat-free (skim) milk. Fat-free, low-fat, or reduced-fat cheeses. Nonfat, low-sodium ricotta or cottage cheese. Low-fat or nonfat yogurt. Low-fat, low-sodium cheese. Fats and oils Soft margarine without trans fats. Vegetable oil. Low-fat, reduced-fat, or light mayonnaise and salad dressings (reduced-sodium). Canola, safflower, olive, soybean, and sunflower oils. Avocado. Seasoning and other foods Herbs. Spices. Seasoning mixes without salt. Unsalted popcorn and pretzels. Fat-free sweets. What foods are not recommended? The items listed may not be a complete list. Talk with your dietitian about what dietary choices are best for you. Grains Baked goods made with fat, such as croissants, muffins, or some breads. Dry pasta or rice meal packs. Vegetables Creamed or fried vegetables. Vegetables in a cheese sauce. Regular canned vegetables (not low-sodium or reduced-sodium). Regular canned tomato sauce and paste (not low-sodium or reduced-sodium). Regular tomato and vegetable juice (not low-sodium or reduced-sodium). Pickles. Olives. Fruits Canned fruit in a light or heavy syrup. Fried fruit. Fruit in cream or butter sauce. Meat and other protein foods Fatty cuts of meat. Ribs. Fried meat. Bacon. Sausage. Bologna and other processed lunch meats. Salami. Fatback. Hotdogs. Bratwurst. Salted nuts and seeds. Canned beans with added salt. Canned or smoked fish. Whole eggs or egg yolks. Chicken or turkey with skin. Dairy Whole or 2% milk, cream, and half-and-half. Whole or full-fat cream cheese. Whole-fat or sweetened yogurt. Full-fat cheese. Nondairy creamers. Whipped toppings.  Processed cheese and cheese spreads. Fats and oils Butter. Stick margarine. Lard. Shortening. Ghee. Bacon fat. Tropical oils, such as coconut, palm kernel, or palm oil. Seasoning and other foods Salted popcorn and pretzels. Onion salt, garlic salt, seasoned salt, table salt, and sea salt. Worcestershire sauce. Tartar sauce. Barbecue sauce. Teriyaki sauce. Soy sauce, including reduced-sodium. Steak sauce. Canned and packaged gravies. Fish sauce. Oyster sauce. Cocktail sauce. Horseradish that you find on the shelf. Ketchup. Mustard. Meat flavorings and tenderizers. Bouillon cubes. Hot sauce and Tabasco sauce. Premade or packaged marinades. Premade or packaged taco seasonings. Relishes. Regular salad dressings. Where to find more information:  National Heart, Lung, and Blood Institute: www.nhlbi.nih.gov  American Heart Association: www.heart.org Summary  The DASH eating plan is a healthy eating plan that has been shown to reduce high blood pressure (hypertension). It may also reduce your risk for type 2 diabetes, heart disease, and stroke.  With the DASH eating plan, you should limit salt (sodium) intake to 2,300 mg a day. If you have hypertension, you may need to reduce your sodium intake to 1,500 mg a day.  When on the DASH eating plan, aim to eat more fresh fruits and vegetables, whole grains, lean proteins, low-fat dairy, and heart-healthy fats.  Work with your health care provider or diet and nutrition specialist (dietitian) to adjust your eating plan to your   individual calorie needs. This information is not intended to replace advice given to you by your health care provider. Make sure you discuss any questions you have with your health care provider. Document Revised: 03/31/2017 Document Reviewed: 04/11/2016 Elsevier Patient Education  2020 Elsevier Inc.  

## 2020-03-25 NOTE — Telephone Encounter (Signed)
Patient only has a few amlodipines left, he needs them called in. [facility] on [address]. Thanks

## 2020-03-25 NOTE — Addendum Note (Signed)
Addended by: Mickel Crow on: 03/25/2020 01:50 PM   Modules accepted: Orders

## 2020-03-25 NOTE — Assessment & Plan Note (Signed)
-  BP today elevated and ambulatory BP readings consistently >140/90 so discussed with patient increasing amlodipine to 5 mg. Patient is agreeable and advised to take 2 tablets of 2.5 mg and let me know if tolerates increase without issues and will send rx for 5 mg. Patient verbalized understanding. -Continue ambulatory BP monitoring. -Recommend to increase water intake. -Continue to stay active. -Will continue to monitor.

## 2020-03-25 NOTE — Progress Notes (Signed)
Established Patient Office Visit  Subjective:  Patient ID: [name], male    DOB: Dec 13, 1930  Age: 86 y.o. MRN: [id number]  CC:  Chief Complaint  Patient presents with  . Hypertension  . Hyperlipidemia    HPI [name] presents for follow up on hypertension and hyperlipidemia.  HTN: Pt denies chest pain, palpitations, dizziness or lower extremity swelling. Taking medication as directed without side effects. Checks BP at home and brings BP log with readings 156/85, 148/85, 136/85 (afternoon check), 150/85, 150/78, and 145/85. Pt reports he does not drink enough water.  HLD: Pt managing with diet and lifestyle changes. States he diet consists of fish and chicken, vegetables and does not really eat red meat. 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   Past Medical History:  Diagnosis Date  . Asthma   . Depression    wife died 2 months ago. 05-03-11  . Eyelid cancer   . Hyperlipidemia, mixed 05/06/2015  . Hypertension   . Rectal cancer ([place]) 1999    Past Surgical History:  Procedure Laterality Date  . COLON RESECTION  1999  . cyst eyelid  2012  . HAND TUMOR EXCISION  2010   right    Family History  Problem Relation Age of Onset  . Hypertension Father   . Stroke Father   . Suicidality Brother   . Colon cancer Neg Hx   . Rectal cancer Neg Hx     Social History   Socioeconomic History  . Marital status: Married    Spouse name: Not on file  . Number of children: Not on file  . Years of education: Not on file  . Highest education level: Not on file  Occupational History  . Not on file  Tobacco Use  . Smoking status: Never Smoker  . Smokeless tobacco: Never Used  . Tobacco comment: Never Used Tobacco  Vaping Use  . Vaping Use: Never used  Substance and Sexual Activity  . Alcohol use: Yes    Alcohol/week: 16.0 standard drinks    Types: 16 Cans of beer per week  . Drug use: No  . Sexual activity: Not Currently  Other Topics Concern  .  Not on file  Social History Narrative  . Not on file   Social Determinants of Health   Financial Resource Strain:   . Difficulty of Paying Living Expenses: Not on file  Food Insecurity:   . Worried About Charity fundraiser in the Last Year: Not on file  . Ran Out of Food in the Last Year: Not on file  Transportation Needs:   . Lack of Transportation (Medical): Not on file  . Lack of Transportation (Non-Medical): Not on file  Physical Activity:   . Days of Exercise per Week: Not on file  . Minutes of Exercise per Session: Not on file  Stress:   . Feeling of Stress : Not on file  Social Connections:   . Frequency of Communication with Friends and Family: Not on file  . Frequency of Social Gatherings with Friends and Family: Not on file  . Attends Religious Services: Not on file  . Active Member of Clubs or Organizations: Not on file  . Attends Archivist Meetings: Not on file  . Marital Status: Not on file  Intimate Partner Violence:   . Fear of Current or Ex-Partner: Not on file  . Emotionally Abused: Not on file  . Physically Abused: Not on file  .  Sexually Abused: Not on file    Outpatient Medications Prior to Visit  Medication Sig Dispense Refill  . betamethasone dipropionate 0.05 % cream APPLY EVERY 12 HOURS TO AFFECTED AREA (RF per DERM) 45 g 0  . budesonide-formoterol (SYMBICORT) 160-4.5 MCG/ACT inhaler Take 2 puffs first thing in am and then another 2 puffs about 12 hours later. 10.2 g 11  . latanoprost (XALATAN) 0.005 % ophthalmic solution Place 1 drop into both eyes at bedtime.    . valsartan-hydrochlorothiazide (DIOVAN-HCT) 320-12.5 MG tablet TAKE 1 TABLET BY MOUTH EVERY DAY 90 tablet 0  . amLODipine (NORVASC) 2.5 MG tablet Take 1 tablet (2.5 mg total) by mouth daily. 90 tablet 0   No facility-administered medications prior to visit.    No Known Allergies  ROS Review of Systems A fourteen system review of systems was performed and found to be  positive as per HPI.   Objective:    Physical Exam General:  Well Developed, well nourished, appropriate for stated age.  Neuro:  Alert and oriented,  extra-ocular muscles intact, no focal deficits   HEENT:  Normocephalic, atraumatic, neck supple Skin:  no gross rash, warm, pink. Cardiac:  RRR, S1 S2 Respiratory:  ECTA B/L and A/P, Not using accessory muscles, speaking in full sentences- unlabored. Vascular:  Ext warm, no cyanosis apprec.; cap RF less 2 sec. Psych:  No HI/SI, judgement and insight good, Euthymic mood. Full Affect.   BP (!) 161/93   Pulse 69   Ht $R'5\' 7"'YK$  (1.702 m)   Wt 142 lb 12.8 oz (64.8 kg)   SpO2 99%   BMI 22.37 kg/m  Wt Readings from Last 3 Encounters:  03/25/20 142 lb 12.8 oz (64.8 kg)  02/28/20 146 lb 3.2 oz (66.3 kg)  11/20/19 150 lb 12.8 oz (68.4 kg)     Health Maintenance Due  Topic Date Due  . TETANUS/TDAP  Never done  . PNA vac Low Risk Adult (2 of 2 - PPSV23) 11/06/2014    There are no preventive care reminders to display for this patient.  Lab Results  Component Value Date   TSH 2.030 12/25/2018   Lab Results  Component Value Date   WBC 7.1 05/06/2019   HGB 15.3 05/06/2019   HCT 43.0 05/06/2019   MCV 89.0 05/06/2019   PLT 210 05/06/2019   Lab Results  Component Value Date   NA 128 (L) 11/20/2019   K 4.6 11/20/2019   CHLORIDE 103 05/05/2016   CO2 23 11/20/2019   GLUCOSE 101 (H) 11/20/2019   BUN 7 (L) 11/20/2019   CREATININE 0.73 (L) 11/20/2019   BILITOT 0.6 11/20/2019   ALKPHOS 70 11/20/2019   AST 61 (H) 11/20/2019   ALT 80 (H) 11/20/2019   PROT 6.9 11/20/2019   ALBUMIN 4.3 11/20/2019   CALCIUM 9.4 11/20/2019   ANIONGAP 9 05/06/2019   EGFR 71 (L) 05/05/2016   Lab Results  Component Value Date   CHOL 162 11/20/2019   Lab Results  Component Value Date   HDL 73 11/20/2019   Lab Results  Component Value Date   LDLCALC 79 11/20/2019   Lab Results  Component Value Date   TRIG 47 11/20/2019   Lab Results   Component Value Date   CHOLHDL 2.2 11/20/2019   Lab Results  Component Value Date   HGBA1C 5.5 12/25/2018      Assessment & Plan:   Problem List Items Addressed This Visit      Cardiovascular and Mediastinum   Essential hypertension    -  BP today elevated and ambulatory BP readings consistently >140/90 so discussed with patient increasing amlodipine to 5 mg. Patient is agreeable and advised to take 2 tablets of 2.5 mg and let me know if tolerates increase without issues and will send rx for 5 mg. Patient verbalized understanding. -Continue ambulatory BP monitoring. -Recommend to increase water intake. -Continue to stay active. -Will continue to monitor.      Relevant Medications   amLODipine (NORVASC) 2.5 MG tablet     Other   Hyperlipidemia, mixed - Primary (Chronic)    -Last lipid panel: total cholesterol 162, triglycerides 47, HDL 73, LDL 79 -Recommend to continue with a diet low in saturated and trans fats. -Continue to stay active.  -Will continue to monitor.      Relevant Medications   amLODipine (NORVASC) 2.5 MG tablet      Meds ordered this encounter  Medications  . amLODipine (NORVASC) 2.5 MG tablet    Sig: Take 2 tablets (5 mg total) by mouth daily.    Dispense:  90 tablet    Refill:  0    Order Specific Question:   Supervising Provider    Answer:   Beatrice Lecher D [[id number]]    Follow-up: Return in about 3 months (around 06/25/2020) for HTN- inc med dose .   Note:  This note was prepared with assistance of Dragon voice recognition software. Occasional wrong-word or sound-a-like substitutions may have occurred due to the inherent limitations of voice recognition software.  Lorrene Reid, PA-C

## 2020-03-25 NOTE — Telephone Encounter (Signed)
Refill sent to pharmacy. AS, CMA 

## 2020-03-28 ENCOUNTER — Other Ambulatory Visit: Payer: Self-pay | Admitting: Physician Assistant

## 2020-03-28 DIAGNOSIS — I10 Essential (primary) hypertension: Secondary | ICD-10-CM

## 2020-04-07 DIAGNOSIS — D0461 Carcinoma in situ of skin of right upper limb, including shoulder: Secondary | ICD-10-CM | POA: Diagnosis not present

## 2020-04-07 DIAGNOSIS — D485 Neoplasm of uncertain behavior of skin: Secondary | ICD-10-CM | POA: Diagnosis not present

## 2020-04-07 DIAGNOSIS — L570 Actinic keratosis: Secondary | ICD-10-CM | POA: Diagnosis not present

## 2020-04-07 DIAGNOSIS — Z85828 Personal history of other malignant neoplasm of skin: Secondary | ICD-10-CM | POA: Diagnosis not present

## 2020-04-07 DIAGNOSIS — C44622 Squamous cell carcinoma of skin of right upper limb, including shoulder: Secondary | ICD-10-CM | POA: Diagnosis not present

## 2020-04-20 DIAGNOSIS — H02112 Cicatricial ectropion of right lower eyelid: Secondary | ICD-10-CM | POA: Diagnosis not present

## 2020-04-20 DIAGNOSIS — H02831 Dermatochalasis of right upper eyelid: Secondary | ICD-10-CM | POA: Diagnosis not present

## 2020-04-20 DIAGNOSIS — H02834 Dermatochalasis of left upper eyelid: Secondary | ICD-10-CM | POA: Diagnosis not present

## 2020-04-20 DIAGNOSIS — H25013 Cortical age-related cataract, bilateral: Secondary | ICD-10-CM | POA: Diagnosis not present

## 2020-05-05 ENCOUNTER — Encounter: Payer: Self-pay | Admitting: Hematology & Oncology

## 2020-05-05 ENCOUNTER — Inpatient Hospital Stay (HOSPITAL_BASED_OUTPATIENT_CLINIC_OR_DEPARTMENT_OTHER): Payer: Medicare HMO | Admitting: Hematology & Oncology

## 2020-05-05 ENCOUNTER — Other Ambulatory Visit: Payer: Self-pay

## 2020-05-05 ENCOUNTER — Inpatient Hospital Stay: Payer: Medicare HMO | Attending: Hematology & Oncology

## 2020-05-05 VITALS — BP 166/88 | HR 88 | Temp 97.7°F | Resp 18 | Wt 143.5 lb

## 2020-05-05 DIAGNOSIS — C20 Malignant neoplasm of rectum: Secondary | ICD-10-CM | POA: Diagnosis not present

## 2020-05-05 DIAGNOSIS — Z9221 Personal history of antineoplastic chemotherapy: Secondary | ICD-10-CM | POA: Insufficient documentation

## 2020-05-05 DIAGNOSIS — Z85048 Personal history of other malignant neoplasm of rectum, rectosigmoid junction, and anus: Secondary | ICD-10-CM | POA: Diagnosis not present

## 2020-05-05 DIAGNOSIS — Z923 Personal history of irradiation: Secondary | ICD-10-CM | POA: Diagnosis not present

## 2020-05-05 DIAGNOSIS — R7989 Other specified abnormal findings of blood chemistry: Secondary | ICD-10-CM | POA: Diagnosis not present

## 2020-05-05 DIAGNOSIS — E782 Mixed hyperlipidemia: Secondary | ICD-10-CM

## 2020-05-05 LAB — CMP (CANCER CENTER ONLY)
ALT: 57 U/L — ABNORMAL HIGH (ref 0–44)
AST: 50 U/L — ABNORMAL HIGH (ref 15–41)
Albumin: 4.4 g/dL (ref 3.5–5.0)
Alkaline Phosphatase: 55 U/L (ref 38–126)
Anion gap: 9 (ref 5–15)
BUN: 11 mg/dL (ref 8–23)
CO2: 31 mmol/L (ref 22–32)
Calcium: 10.5 mg/dL — ABNORMAL HIGH (ref 8.9–10.3)
Chloride: 93 mmol/L — ABNORMAL LOW (ref 98–111)
Creatinine: 0.79 mg/dL (ref 0.61–1.24)
GFR, Estimated: >60 mL/min (ref >60)
Glucose, Bld: 105 mg/dL — ABNORMAL HIGH (ref 70–99)
Potassium: 4.7 mmol/L (ref 3.5–5.1)
Sodium: 133 mmol/L — ABNORMAL LOW (ref 135–145)
Total Bilirubin: 0.8 mg/dL (ref 0.3–1.2)
Total Protein: 7.4 g/dL (ref 6.5–8.1)

## 2020-05-05 LAB — CBC WITH DIFFERENTIAL (CANCER CENTER ONLY)
Abs Immature Granulocytes: 0.02 10*3/uL (ref 0.00–0.07)
Basophils Absolute: 0.1 10*3/uL (ref 0.0–0.1)
Basophils Relative: 1 %
Eosinophils Absolute: 0.3 10*3/uL (ref 0.0–0.5)
Eosinophils Relative: 5 %
HCT: 42.2 % (ref 39.0–52.0)
Hemoglobin: 15 g/dL (ref 13.0–17.0)
Immature Granulocytes: 0 %
Lymphocytes Relative: 20 %
Lymphs Abs: 1.1 10*3/uL (ref 0.7–4.0)
MCH: 32.5 pg (ref 26.0–34.0)
MCHC: 35.5 g/dL (ref 30.0–36.0)
MCV: 91.3 fL (ref 80.0–100.0)
Monocytes Absolute: 0.7 10*3/uL (ref 0.1–1.0)
Monocytes Relative: 12 %
Neutro Abs: 3.5 10*3/uL (ref 1.7–7.7)
Neutrophils Relative %: 62 %
Platelet Count: 200 10*3/uL (ref 150–400)
RBC: 4.62 MIL/uL (ref 4.22–5.81)
RDW: 11.2 % — ABNORMAL LOW (ref 11.5–15.5)
WBC Count: 5.7 10*3/uL (ref 4.0–10.5)
nRBC: 0 % (ref 0.0–0.2)

## 2020-05-05 LAB — LIPID PANEL
Cholesterol: 214 mg/dL — ABNORMAL HIGH (ref 0–200)
HDL: 77 mg/dL (ref >40)
LDL Cholesterol: 121 mg/dL — ABNORMAL HIGH (ref 0–99)
Total CHOL/HDL Ratio: 2.8 RATIO
Triglycerides: 81 mg/dL (ref <150)
VLDL: 16 mg/dL (ref 0–40)

## 2020-05-05 LAB — CEA (IN HOUSE-CHCC): CEA (CHCC-In House): 1.76 ng/mL (ref 0.00–5.00)

## 2020-05-05 NOTE — Progress Notes (Signed)
Hematology and Oncology Follow Up Visit  [name] [id number] 16-Jul-1930 87 y.o. 05/05/2020   Principle Diagnosis:   Stage II (T3N0M0) rectal cancer  Current Therapy:    Observation     Interim History:  [name] is back for follow-up.  As always, I see him yearly.  He likes to come back to see [place] once a year.  He just feels very confident with our examinations.  He has had no problems since we last saw him.  He has had no issues with his medicines.  He had no change in medications.  His last CEA from 2020 was 1.35.  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  I myself, enjoy eating a ton of beef.  I probably had 2 pounds of prime rib the day after New Year's.  He still has some postherpetic neuralgia.   He has had no bleeding.  He has had no change in bowel or bladder habits.  He has had no cough or shortness of breath.  There has s been no leg swelling.  Overall, his performance status is ECOG 0.  Medications:  Current Outpatient Medications:  .  amLODipine (NORVASC) 2.5 MG tablet, TAKE 1 TABLET BY MOUTH EVERY DAY, Disp: 90 tablet, Rfl: 0 .  betamethasone dipropionate 0.05 % cream, APPLY EVERY 12 HOURS TO AFFECTED AREA (RF per DERM), Disp: 45 g, Rfl: 0 .  budesonide-formoterol (SYMBICORT) 160-4.5 MCG/ACT inhaler, Take 2 puffs first thing in am and then another 2 puffs about 12 hours later., Disp: 10.2 g, Rfl: 11 .  latanoprost (XALATAN) 0.005 % ophthalmic solution, Place 1 drop into both eyes at bedtime., Disp: , Rfl:  .  valsartan-hydrochlorothiazide (DIOVAN-HCT) 320-12.5 MG tablet, TAKE 1 TABLET BY MOUTH EVERY DAY, Disp: 90 tablet, Rfl: 0 .  fluorouracil (EFUDEX) 5 % cream, Apply topically. (Patient not taking: Reported on 05/05/2020), Disp: , Rfl:   Allergies: No Known Allergies  Past Medical History, Surgical history, Social history, and Family History were reviewed and  updated.  Review of Systems: Review of Systems  Constitutional: Negative.   HENT: Negative.   Eyes: Positive for blurred vision and redness.  Respiratory: Negative.   Cardiovascular: Negative.   Gastrointestinal: Negative.   Genitourinary: Negative.   Musculoskeletal: Negative.   Skin: Negative.   Neurological: Negative.   Endo/Heme/Allergies: Negative.   Psychiatric/Behavioral: Negative.     Physical Exam:  weight is 143 lb 8 oz (65.1 kg). His oral temperature is 97.7 F (36.5 C). His blood pressure is 166/88 (abnormal) and his pulse is 88. His respiration is 18 and oxygen saturation is 99%.   Physical Exam Vitals reviewed.  HENT:     Head: Normocephalic and atraumatic.  Eyes:     Pupils: Pupils are equal, round, and reactive to light.  Cardiovascular:     Rate and Rhythm: Normal rate and regular rhythm.     Heart sounds: Normal heart sounds.  Pulmonary:     Effort: Pulmonary effort is normal.     Breath sounds: Normal breath sounds.  Abdominal:     General: Bowel sounds are normal.     Palpations: Abdomen is soft.  Musculoskeletal:        General: No tenderness or deformity. Normal range of motion.     Cervical back: Normal range of motion.  Lymphadenopathy:     Cervical: No cervical adenopathy.  Skin:  General: Skin is warm and dry.     Findings: No erythema or rash.  Neurological:     Mental Status: He is alert and oriented to person, place, and time.  Psychiatric:        Behavior: Behavior normal.        Thought Content: Thought content normal.        Judgment: Judgment normal.      Lab Results  Component Value Date   WBC 5.7 05/05/2020   HGB 15.0 05/05/2020   HCT 42.2 05/05/2020   MCV 91.3 05/05/2020   PLT 200 05/05/2020     Chemistry      Component Value Date/Time   NA 133 (L) 05/05/2020 0749   NA 128 (L) 11/20/2019 0856   NA 142 05/04/2017 0741   NA 140 05/05/2016 0747   K 4.7 05/05/2020 0749   K 4.1 05/04/2017 0741   K 4.1 05/05/2016  0747   CL 93 (L) 05/05/2020 0749   CL 99 05/04/2017 0741   CO2 31 05/05/2020 0749   CO2 29 05/04/2017 0741   CO2 27 05/05/2016 0747   BUN 11 05/05/2020 0749   BUN 7 (L) 11/20/2019 0856   BUN 13 05/04/2017 0741   BUN 20.5 05/05/2016 0747   CREATININE 0.79 05/05/2020 0749   CREATININE 0.8 05/04/2017 0741   CREATININE 1.0 05/05/2016 0747   GLU 91 08/10/2017 0000      Component Value Date/Time   CALCIUM 10.5 (H) 05/05/2020 0749   CALCIUM 9.7 05/04/2017 0741   CALCIUM 10.2 05/05/2016 0747   ALKPHOS 55 05/05/2020 0749   ALKPHOS 62 05/04/2017 0741   ALKPHOS 79 05/05/2016 0747   AST 50 (H) 05/05/2020 0749   AST 30 05/05/2016 0747   ALT 57 (H) 05/05/2020 0749   ALT 36 05/04/2017 0741   ALT 46 05/05/2016 0747   BILITOT 0.8 05/05/2020 0749   BILITOT 0.74 05/05/2016 0747         Impression and Plan: [name] is 77 on the [age]-year-old gentleman with a remote history of rectal cancer. He has stage II disease. He had neoadjuvant chemotherapy and radiation therapy.  He is clearly cured of this. However, he likes to come back to see [place] yearly. He just gets "peace of mind" with, to see [place].  His LFTs are up a little.  He says that he drinks 3-4 beers a day.  I have no problem with this.  As always, will see him back in 1 year.  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  Volanda Napoleon, MD 1/4/20228:30 AM

## 2020-05-27 DIAGNOSIS — D692 Other nonthrombocytopenic purpura: Secondary | ICD-10-CM | POA: Diagnosis not present

## 2020-05-27 DIAGNOSIS — L570 Actinic keratosis: Secondary | ICD-10-CM | POA: Diagnosis not present

## 2020-05-27 DIAGNOSIS — L853 Xerosis cutis: Secondary | ICD-10-CM | POA: Diagnosis not present

## 2020-05-27 DIAGNOSIS — L308 Other specified dermatitis: Secondary | ICD-10-CM | POA: Diagnosis not present

## 2020-05-27 DIAGNOSIS — D485 Neoplasm of uncertain behavior of skin: Secondary | ICD-10-CM | POA: Diagnosis not present

## 2020-05-27 DIAGNOSIS — C44222 Squamous cell carcinoma of skin of right ear and external auricular canal: Secondary | ICD-10-CM | POA: Diagnosis not present

## 2020-05-27 DIAGNOSIS — Z85828 Personal history of other malignant neoplasm of skin: Secondary | ICD-10-CM | POA: Diagnosis not present

## 2020-06-01 ENCOUNTER — Other Ambulatory Visit: Payer: Self-pay | Admitting: Physician Assistant

## 2020-06-01 DIAGNOSIS — I10 Essential (primary) hypertension: Secondary | ICD-10-CM

## 2020-06-25 ENCOUNTER — Ambulatory Visit (INDEPENDENT_AMBULATORY_CARE_PROVIDER_SITE_OTHER): Payer: Medicare HMO | Admitting: Physician Assistant

## 2020-06-25 ENCOUNTER — Other Ambulatory Visit: Payer: Self-pay

## 2020-06-25 ENCOUNTER — Encounter: Payer: Self-pay | Admitting: Physician Assistant

## 2020-06-25 VITALS — BP 167/82 | HR 69 | Temp 96.0°F | Ht 67.0 in | Wt 140.9 lb

## 2020-06-25 DIAGNOSIS — I10 Essential (primary) hypertension: Secondary | ICD-10-CM

## 2020-06-25 DIAGNOSIS — J4540 Moderate persistent asthma, uncomplicated: Secondary | ICD-10-CM | POA: Diagnosis not present

## 2020-06-25 DIAGNOSIS — E782 Mixed hyperlipidemia: Secondary | ICD-10-CM

## 2020-06-25 DIAGNOSIS — L98491 Non-pressure chronic ulcer of skin of other sites limited to breakdown of skin: Secondary | ICD-10-CM

## 2020-06-25 DIAGNOSIS — C20 Malignant neoplasm of rectum: Secondary | ICD-10-CM

## 2020-06-25 DIAGNOSIS — R21 Rash and other nonspecific skin eruption: Secondary | ICD-10-CM

## 2020-06-25 MED ORDER — MUPIROCIN CALCIUM 2 % EX CREA: 1.0000 "application " | TOPICAL_CREAM | Freq: Two times a day (BID) | CUTANEOUS | 0 refills | Status: DC

## 2020-06-25 MED ORDER — AMLODIPINE BESYLATE 5 MG PO TABS: 5.0000 mg | ORAL_TABLET | Freq: Every day | ORAL | 3 refills | Status: DC

## 2020-06-25 NOTE — Progress Notes (Signed)
Established Patient Office Visit  Subjective:  Patient ID: [name], male    DOB: 04-Jan-1931  Age: 87 y.o. MRN: [id number]  CC:  Chief Complaint  Patient presents with  . Hypertension    HPI [name] presents for follow up on hypertension.  HTN: Pt denies chest pain, palpitations, dizziness, headache or lower extremity swelling. Taking medication as directed without side effects. Checks BP at home and brings in BP log with readings 128/80, 140/75, 139/79, 144/75, 125/83, 137/79 and pulse 62,62,68,64,68. Pt follows a low salt diet. Tries to drink water.  HLD: Pt reports he cooks his meals. Does not eat out much (fast food). He does like to have 2-3 beers with his dinner.   Rash: Reports has seen dermatologist last month was given a cream to use which has not been too effective. Reports rash is non-tender and non-itchy. Denies new soaps or detergents.  Past Medical History:  Diagnosis Date  . Asthma   . Depression    wife died 2 months ago. Apr 20, 2011  . Eyelid cancer   . Hyperlipidemia, mixed 05/06/2015  . Hypertension   . Rectal cancer ([place]) 1999    Past Surgical History:  Procedure Laterality Date  . COLON RESECTION  1999  . cyst eyelid  2012  . HAND TUMOR EXCISION  2010   right    Family History  Problem Relation Age of Onset  . Hypertension Father   . Stroke Father   . Suicidality Brother   . Colon cancer Neg Hx   . Rectal cancer Neg Hx     Social History   Socioeconomic History  . Marital status: Married    Spouse name: Not on file  . Number of children: Not on file  . Years of education: Not on file  . Highest education level: Not on file  Occupational History  . Not on file  Tobacco Use  . Smoking status: Never Smoker  . Smokeless tobacco: Never Used  . Tobacco comment: Never Used Tobacco  Vaping Use  . Vaping Use: Never used  Substance and Sexual Activity  . Alcohol use: Yes    Alcohol/week: 16.0 standard drinks    Types: 16  Cans of beer per week  . Drug use: No  . Sexual activity: Not Currently  Other Topics Concern  . Not on file  Social History Narrative  . Not on file   Social Determinants of Health   Financial Resource Strain: Not on file  Food Insecurity: Not on file  Transportation Needs: Not on file  Physical Activity: Not on file  Stress: Not on file  Social Connections: Not on file  Intimate Partner Violence: Not on file    Outpatient Medications Prior to Visit  Medication Sig Dispense Refill  . betamethasone dipropionate 0.05 % cream APPLY EVERY 12 HOURS TO AFFECTED AREA (RF per DERM) 45 g 0  . budesonide-formoterol (SYMBICORT) 160-4.5 MCG/ACT inhaler Take 2 puffs first thing in am and then another 2 puffs about 12 hours later. 10.2 g 11  . latanoprost (XALATAN) 0.005 % ophthalmic solution Place 1 drop into both eyes at bedtime.    . valsartan-hydrochlorothiazide (DIOVAN-HCT) 320-12.5 MG tablet TAKE 1 TABLET BY MOUTH EVERY DAY 90 tablet 0  . amLODipine (NORVASC) 2.5 MG tablet TAKE 1 TABLET BY MOUTH EVERY DAY 90 tablet 0  . fluorouracil (EFUDEX) 5 % cream Apply topically. (Patient not taking: No sig reported)     No facility-administered medications prior to  visit.    No Known Allergies  ROS Review of Systems Review of Systems:  A fourteen system review of systems was performed and found to be positive as per HPI.   Objective:    Physical Exam General:  Pleasant and cooperative, in no acute distress  Neuro:  Alert and oriented,  extra-ocular muscles intact  HEENT:  Normocephalic, atraumatic, neck supple  Skin:  Maculopapular rash of arms and back with two stage 1 ulcers on left upper arm with mild erythema and warmth  Cardiac:  RRR, S1 S2 wnl's Respiratory:  ECTA B/L, Not using accessory muscles, speaking in full sentences- unlabored. Vascular:  Ext warm, no cyanosis apprec.; no gross edema Psych:  No HI/SI, judgement and insight good, Euthymic mood. Full Affect.  BP (!) 167/82    Pulse 69   Temp (!) 96 F (35.6 C)   Ht _0  (1.702 m)   Wt 140 lb 14.4 oz (63.9 kg)   SpO2 98%   BMI 22.07 kg/m  Wt Readings from Last 3 Encounters:  06/25/20 140 lb 14.4 oz (63.9 kg)  05/05/20 143 lb 8 oz (65.1 kg)  03/25/20 142 lb 12.8 oz (64.8 kg)     Health Maintenance Due  Topic Date Due  . TETANUS/TDAP  Never done  . PNA vac Low Risk Adult (2 of 2 - PPSV23) 11/06/2014    There are no preventive care reminders to display for this patient.  Lab Results  Component Value Date   TSH 2.030 12/25/2018   Lab Results  Component Value Date   WBC 5.7 05/05/2020   HGB 15.0 05/05/2020   HCT 42.2 05/05/2020   MCV 91.3 05/05/2020   PLT 200 05/05/2020   Lab Results  Component Value Date   NA 133 (L) 05/05/2020   K 4.7 05/05/2020   CHLORIDE 103 05/05/2016   CO2 31 05/05/2020   GLUCOSE 105 (H) 05/05/2020   BUN 11 05/05/2020   CREATININE 0.79 05/05/2020   BILITOT 0.8 05/05/2020   ALKPHOS 55 05/05/2020   AST 50 (H) 05/05/2020   ALT 57 (H) 05/05/2020   PROT 7.4 05/05/2020   ALBUMIN 4.4 05/05/2020   CALCIUM 10.5 (H) 05/05/2020   ANIONGAP 9 05/05/2020   EGFR 71 (L) 05/05/2016   Lab Results  Component Value Date   CHOL 214 (H) 05/05/2020   Lab Results  Component Value Date   HDL 77 05/05/2020   Lab Results  Component Value Date   LDLCALC 121 (H) 05/05/2020   Lab Results  Component Value Date   TRIG 81 05/05/2020   Lab Results  Component Value Date   CHOLHDL 2.8 05/05/2020   Lab Results  Component Value Date   HGBA1C 5.5 12/25/2018      Assessment & Plan:   Problem List Items Addressed This Visit      Cardiovascular and Mediastinum   Essential hypertension - Primary    -BP elevated in the office. Ambulatory BP readings have been stable so will continue current medication regimen. Amlodipine was increased at last office visit to 5 mg. Advised patient to check BP/pulse for the next two weeks and keep a log to forward to the office. If BP  consistently >140/90 then will consider treatment adjustments. -Continue low sodium diet and stay well hydrated. Advised to limit beers to 2 drinks/day. -Recent CMP stable. -Follow up in 3 months      Relevant Medications   amLODipine (NORVASC) 5 MG tablet     Respiratory  Chronic asthma, moderate persistent, uncomplicated    -Followed by Pulmonology.  -On Symbicort.        Digestive   Rectal cancer ([place]) (Chronic)    -Followed by Oncology, Dr. Marin Olp.        Other   Hyperlipidemia, mixed (Chronic)    -Recent lipid panel: total cholesterol 214, triglycerides 81, HDL 77, LDL 121 -Discussed with patient management options and prefers to work on diet and lifestyle changes which is reasonable, especially with chronic elevated liver enzymes. -Advised to limit beer use to 2 drinks/day. -Will continue to monitor.      Relevant Medications   amLODipine (NORVASC) 5 MG tablet    Other Visit Diagnoses    Skin rash       Relevant Medications   mupirocin cream (BACTROBAN) 2 %   Skin ulcer, limited to breakdown of skin (HCC)       Relevant Medications   mupirocin cream (BACTROBAN) 2 %     Skin rash: -Recommend to continue topical corticosteroid and follow up with Dermatology if rash fails to improve or worsen.  Skin ulcer, limited to breakdown of skin: -Will send topical antibiotic cream for potential developing infection due to erythema and warmth.  Meds ordered this encounter  Medications  . mupirocin cream (BACTROBAN) 2 %    Sig: Apply 1 application topically 2 (two) times daily.    Dispense:  15 g    Refill:  0  . amLODipine (NORVASC) 5 MG tablet    Sig: Take 1 tablet (5 mg total) by mouth daily.    Dispense:  90 tablet    Refill:  3    Follow-up: Return in about 3 months (around 09/22/2020) for HTN, HLD.   Note:  This note was prepared with assistance of Dragon voice recognition software. Occasional wrong-word or sound-a-like substitutions may have occurred due  to the inherent limitations of voice recognition software.  Lorrene Reid, PA-C

## 2020-06-25 NOTE — Assessment & Plan Note (Addendum)
-  BP elevated in the office. Ambulatory BP readings have been stable so will continue current medication regimen. Amlodipine was increased at last office visit to 5 mg. Advised patient to check BP/pulse for the next two weeks and keep a log to forward to the office. If BP consistently >140/90 then will consider treatment adjustments. -Continue low sodium diet and stay well hydrated. Advised to limit beers to 2 drinks/day. -Recent CMP stable. -Follow up in 3 months

## 2020-06-25 NOTE — Assessment & Plan Note (Signed)
-  Followed by Pulmonology.  -On Symbicort.

## 2020-06-25 NOTE — Assessment & Plan Note (Signed)
-  Recent lipid panel: total cholesterol 214, triglycerides 81, HDL 77, LDL 121 -Discussed with patient management options and prefers to work on diet and lifestyle changes which is reasonable, especially with chronic elevated liver enzymes. -Advised to limit beer use to 2 drinks/day. -Will continue to monitor.

## 2020-06-25 NOTE — Assessment & Plan Note (Signed)
-  Followed by Oncology, Dr. Marin Olp.

## 2020-06-25 NOTE — Patient Instructions (Signed)

## 2020-07-16 ENCOUNTER — Encounter: Payer: Self-pay | Admitting: Nurse Practitioner

## 2020-07-16 ENCOUNTER — Ambulatory Visit (INDEPENDENT_AMBULATORY_CARE_PROVIDER_SITE_OTHER): Payer: Medicare HMO | Admitting: Nurse Practitioner

## 2020-07-16 ENCOUNTER — Other Ambulatory Visit: Payer: Self-pay

## 2020-07-16 VITALS — BP 136/80 | HR 71 | Temp 97.8°F | Ht 64.0 in | Wt 141.2 lb

## 2020-07-16 DIAGNOSIS — Y92009 Unspecified place in unspecified non-institutional (private) residence as the place of occurrence of the external cause: Secondary | ICD-10-CM | POA: Diagnosis not present

## 2020-07-16 DIAGNOSIS — S7002XA Contusion of left hip, initial encounter: Secondary | ICD-10-CM | POA: Diagnosis not present

## 2020-07-16 DIAGNOSIS — W19XXXA Unspecified fall, initial encounter: Secondary | ICD-10-CM | POA: Diagnosis not present

## 2020-07-16 NOTE — Patient Instructions (Signed)
Fall Prevention in the Home, Adult Falls can cause injuries and can happen to people of all ages. There are many things you can do to make your home safe and to help prevent falls. Ask for help when making these changes. What actions can I take to prevent falls? General Instructions  Use good lighting in all rooms. Replace any light bulbs that burn out.  Turn on the lights in dark areas. Use night-lights.  Keep items that you use often in easy-to-reach places. Lower the shelves around your home if needed.  Set up your furniture so you have a clear path. Avoid moving your furniture around.  Do not have throw rugs or other things on the floor that can make you trip.  Avoid walking on wet floors.  If any of your floors are uneven, fix them.  Add color or contrast paint or tape to clearly mark and help you see: ? Grab bars or handrails. ? First and last steps of staircases. ? Where the edge of each step is.  If you use a stepladder: ? Make sure that it is fully opened. Do not climb a closed stepladder. ? Make sure the sides of the stepladder are locked in place. ? Ask someone to hold the stepladder while you use it.  Know where your pets are when moving through your home. What can I do in the bathroom?  Keep the floor dry. Clean up any water on the floor right away.  Remove soap buildup in the tub or shower.  Use nonskid mats or decals on the floor of the tub or shower.  Attach bath mats securely with double-sided, nonslip rug tape.  If you need to sit down in the shower, use a plastic, nonslip stool.  Install grab bars by the toilet and in the tub and shower. Do not use towel bars as grab bars.      What can I do in the bedroom?  Make sure that you have a light by your bed that is easy to reach.  Do not use any sheets or blankets for your bed that hang to the floor.  Have a firm chair with side arms that you can use for support when you get dressed. What can I do in  the kitchen?  Clean up any spills right away.  If you need to reach something above you, use a step stool with a grab bar.  Keep electrical cords out of the way.  Do not use floor polish or wax that makes floors slippery. What can I do with my stairs?  Do not leave any items on the stairs.  Make sure that you have a light switch at the top and the bottom of the stairs.  Make sure that there are handrails on both sides of the stairs. Fix handrails that are broken or loose.  Install nonslip stair treads on all your stairs.  Avoid having throw rugs at the top or bottom of the stairs.  Choose a carpet that does not hide the edge of the steps on the stairs.  Check carpeting to make sure that it is firmly attached to the stairs. Fix carpet that is loose or worn. What can I do on the outside of my home?  Use bright outdoor lighting.  Fix the edges of walkways and driveways and fix any cracks.  Remove anything that might make you trip as you walk through a door, such as a raised step or threshold.  Trim any   bushes or trees on paths to your home.  Check to see if handrails are loose or broken and that both sides of all steps have handrails.  Install guardrails along the edges of any raised decks and porches.  Clear paths of anything that can make you trip, such as tools or rocks.  Have leaves, snow, or ice cleared regularly.  Use sand or salt on paths during winter.  Clean up any spills in your garage right away. This includes grease or oil spills. What other actions can I take?  Wear shoes that: ? Have a low heel. Do not wear high heels. ? Have rubber bottoms. ? Feel good on your feet and fit well. ? Are closed at the toe. Do not wear open-toe sandals.  Use tools that help you move around if needed. These include: ? Canes. ? Walkers. ? Scooters. ? Crutches.  Review your medicines with your doctor. Some medicines can make you feel dizzy. This can increase your chance  of falling. Ask your doctor what else you can do to help prevent falls. Where to find more information  Centers for Disease Control and Prevention, [employer]: www.cdc.gov  National Institute on Aging: www.nia.nih.gov Contact a doctor if:  You are afraid of falling at home.  You feel weak, drowsy, or dizzy at home.  You fall at home. Summary  There are many simple things that you can do to make your home safe and to help prevent falls.  Ways to make your home safe include removing things that can make you trip and installing grab bars in the bathroom.  Ask for help when making these changes in your home. This information is not intended to replace advice given to you by your health care provider. Make sure you discuss any questions you have with your health care provider. Document Revised: 11/20/2019 Document Reviewed: 11/20/2019 Elsevier Patient Education  2021 [employer]  

## 2020-07-16 NOTE — Progress Notes (Signed)
Acute Office Visit  Subjective:    Patient ID: [name], male    DOB: 11-13-1930, 87 y.o.   MRN: [id number]  Chief Complaint  Patient presents with  . Fall    HPI Patient is in today for evaluation of bruising after a fall at home. States that he woke up in the middle of the night to use the bathroom. States that he had left a towel on the floor. He states that towel slipped out from under him, causing him to fall. He hit his left hip and flank area into the tank of the toilet. States that he really wasn't too sore on Monday or Tuesday, but yesterday and today, he has become sore in the left hip area and left lower rib cage area. He has noticed significant bruising and states that he does bruise easily. He states that certain movements, such as bending to the right and reaching with the left arms increase the pain. He is able to walk and walking doesn't cause him too much pain. He has not taken any medication to help and has not applied ice or heat to effected areas. He states that sitting still and resting helps to relieve the pain. He denies dizziness, headache, loss of consciousness. Denies loss of balance contributing to fall.   Past Medical History:  Diagnosis Date  . Asthma   . Depression    wife died 2 months ago. 04-18-2011  . Eyelid cancer   . Hyperlipidemia, mixed 05/06/2015  . Hypertension   . Rectal cancer ([place]) 1999    Past Surgical History:  Procedure Laterality Date  . COLON RESECTION  1999  . cyst eyelid  2012  . HAND TUMOR EXCISION  2010   right    Family History  Problem Relation Age of Onset  . Hypertension Father   . Stroke Father   . Suicidality Brother   . Colon cancer Neg Hx   . Rectal cancer Neg Hx     Social History   Socioeconomic History  . Marital status: Married    Spouse name: Not on file  . Number of children: Not on file  . Years of education: Not on file  . Highest education level: Not on file  Occupational History  . Not  on file  Tobacco Use  . Smoking status: Never Smoker  . Smokeless tobacco: Never Used  . Tobacco comment: Never Used Tobacco  Vaping Use  . Vaping Use: Never used  Substance and Sexual Activity  . Alcohol use: Yes    Alcohol/week: 16.0 standard drinks    Types: 16 Cans of beer per week  . Drug use: No  . Sexual activity: Not Currently  Other Topics Concern  . Not on file  Social History Narrative  . Not on file   Social Determinants of Health   Financial Resource Strain: Not on file  Food Insecurity: Not on file  Transportation Needs: Not on file  Physical Activity: Not on file  Stress: Not on file  Social Connections: Not on file  Intimate Partner Violence: Not on file    Outpatient Medications Prior to Visit  Medication Sig Dispense Refill  . amLODipine (NORVASC) 5 MG tablet Take 1 tablet (5 mg total) by mouth daily. 90 tablet 3  . betamethasone dipropionate 0.05 % cream APPLY EVERY 12 HOURS TO AFFECTED AREA (RF per DERM) 45 g 0  . budesonide-formoterol (SYMBICORT) 160-4.5 MCG/ACT inhaler Take 2 puffs first thing in am and  then another 2 puffs about 12 hours later. 10.2 g 11  . latanoprost (XALATAN) 0.005 % ophthalmic solution Place 1 drop into both eyes at bedtime.    . mupirocin cream (BACTROBAN) 2 % Apply 1 application topically 2 (two) times daily. 15 g 0  . valsartan-hydrochlorothiazide (DIOVAN-HCT) 320-12.5 MG tablet TAKE 1 TABLET BY MOUTH EVERY DAY 90 tablet 0   No facility-administered medications prior to visit.    No Known Allergies  Review of Systems  Constitutional: Positive for activity change. Negative for chills and fever.       States that he has not been quite as active since his fall on Sunday night.  HENT: Negative for congestion and sinus pain.   Respiratory: Negative for cough and wheezing.   Cardiovascular: Negative for chest pain and palpitations.  Gastrointestinal: Negative for constipation and nausea.  Musculoskeletal: Positive for  arthralgias, joint swelling and myalgias. Negative for back pain.       Bruising and mild swelling over the right hip. Sore after fall on Sunday night.   Skin: Negative for rash.  Neurological: Negative for dizziness, weakness and headaches.  Hematological: Bruises/bleeds easily.  Psychiatric/Behavioral: The patient is not nervous/anxious.   All other systems reviewed and are negative.      Objective:    Today's Vitals   07/16/20 0823 07/16/20 0844  BP: (!) 154/81 136/80  Pulse: 71   Temp: 97.8 F (36.6 C)   SpO2: 97%   Weight: 141 lb 3.2 oz (64 kg)   Height: _0  (1.626 m)    Body mass index is 24.24 kg/m.    Wt Readings from Last 3 Encounters:  07/16/20 141 lb 3.2 oz (64 kg)  06/25/20 140 lb 14.4 oz (63.9 kg)  05/05/20 143 lb 8 oz (65.1 kg)   Physical Exam Vitals and nursing note reviewed.  Constitutional:      Appearance: Normal appearance.  HENT:     Head: Normocephalic and atraumatic.  Eyes:     Pupils: Pupils are equal, round, and reactive to light.  Cardiovascular:     Rate and Rhythm: Normal rate and regular rhythm.     Heart sounds: Normal heart sounds.  Pulmonary:     Breath sounds: Normal breath sounds.  Musculoskeletal:        General: Swelling, tenderness and signs of injury present.     Cervical back: Normal range of motion and neck supple.       Legs:  Skin:    Findings: Bruising present.       Neurological:     General: No focal deficit present.     Mental Status: He is alert and oriented to person, place, and time.  Psychiatric:        Mood and Affect: Mood normal.        Behavior: Behavior normal.        Thought Content: Thought content normal.        Judgment: Judgment normal.    Health Maintenance Due  Topic Date Due  . TETANUS/TDAP  Never done  . PNA vac Low Risk Adult (2 of 2 - PPSV23) 11/06/2014    There are no preventive care reminders to display for this patient.   Lab Results  Component Value Date   TSH 2.030  12/25/2018   Lab Results  Component Value Date   WBC 5.7 05/05/2020   HGB 15.0 05/05/2020   HCT 42.2 05/05/2020   MCV 91.3 05/05/2020   PLT 200 05/05/2020  Lab Results  Component Value Date   NA 133 (L) 05/05/2020   K 4.7 05/05/2020   CHLORIDE 103 05/05/2016   CO2 31 05/05/2020   GLUCOSE 105 (H) 05/05/2020   BUN 11 05/05/2020   CREATININE 0.79 05/05/2020   BILITOT 0.8 05/05/2020   ALKPHOS 55 05/05/2020   AST 50 (H) 05/05/2020   ALT 57 (H) 05/05/2020   PROT 7.4 05/05/2020   ALBUMIN 4.4 05/05/2020   CALCIUM 10.5 (H) 05/05/2020   ANIONGAP 9 05/05/2020   EGFR 71 (L) 05/05/2016   Lab Results  Component Value Date   CHOL 214 (H) 05/05/2020   Lab Results  Component Value Date   HDL 77 05/05/2020   Lab Results  Component Value Date   LDLCALC 121 (H) 05/05/2020   Lab Results  Component Value Date   TRIG 81 05/05/2020   Lab Results  Component Value Date   CHOLHDL 2.8 05/05/2020   Lab Results  Component Value Date   HGBA1C 5.5 12/25/2018       Assessment & Plan:  1. Fall at home, initial encounter Patient fell at home after slipping on a towel he had left on the floor. We discussed fall prevention and methods to decrease the likelihood this may happen again. Written information also provided.   2. Contusion of left hip, initial encounter Moderate bruising of superior aspect of left hip. No crepitus palpated. No bony deformities or abnormalities noted at this time. Encouraged him to take tylenol if needed for pain. Consider applying low heat to effected area to facilitate healing. Instructed the patient to contact the office if pain or bruising became worse. Will get x-ray of left hip if indicated.   Problem List Items Addressed This Visit      Other   Fall at home, initial encounter - Primary   Contusion of left hip      Time spent with the patient was approximately 25 minutes. This time included reviewing progress notes, labs, imaging studies, and  discussing plan for follow up.   Ronnell Freshwater, NP

## 2020-08-15 ENCOUNTER — Other Ambulatory Visit: Payer: Self-pay | Admitting: Physician Assistant

## 2020-08-15 DIAGNOSIS — I10 Essential (primary) hypertension: Secondary | ICD-10-CM

## 2020-08-18 ENCOUNTER — Other Ambulatory Visit: Payer: Self-pay | Admitting: Physician Assistant

## 2020-08-18 DIAGNOSIS — I10 Essential (primary) hypertension: Secondary | ICD-10-CM

## 2020-08-18 MED ORDER — AMLODIPINE BESYLATE 5 MG PO TABS: 5.0000 mg | ORAL_TABLET | Freq: Every day | ORAL | 0 refills | Status: DC

## 2020-09-07 ENCOUNTER — Other Ambulatory Visit: Payer: Self-pay | Admitting: Physician Assistant

## 2020-09-07 DIAGNOSIS — I10 Essential (primary) hypertension: Secondary | ICD-10-CM

## 2020-09-24 DIAGNOSIS — I10 Essential (primary) hypertension: Secondary | ICD-10-CM | POA: Diagnosis not present

## 2020-09-24 DIAGNOSIS — Z03818 Encounter for observation for suspected exposure to other biological agents ruled out: Secondary | ICD-10-CM | POA: Diagnosis not present

## 2020-09-24 DIAGNOSIS — Z20822 Contact with and (suspected) exposure to covid-19: Secondary | ICD-10-CM | POA: Diagnosis not present

## 2020-09-29 ENCOUNTER — Ambulatory Visit (INDEPENDENT_AMBULATORY_CARE_PROVIDER_SITE_OTHER): Payer: Medicare HMO | Admitting: Physician Assistant

## 2020-09-29 ENCOUNTER — Encounter: Payer: Self-pay | Admitting: Physician Assistant

## 2020-09-29 ENCOUNTER — Other Ambulatory Visit: Payer: Self-pay

## 2020-09-29 VITALS — BP 120/79 | HR 77 | Temp 99.9°F | Ht 68.0 in | Wt 135.3 lb

## 2020-09-29 DIAGNOSIS — I10 Essential (primary) hypertension: Secondary | ICD-10-CM | POA: Diagnosis not present

## 2020-09-29 DIAGNOSIS — R748 Abnormal levels of other serum enzymes: Secondary | ICD-10-CM | POA: Diagnosis not present

## 2020-09-29 DIAGNOSIS — E782 Mixed hyperlipidemia: Secondary | ICD-10-CM

## 2020-09-29 NOTE — Progress Notes (Signed)
Established Patient Office Visit  Subjective:  Patient ID: [name], male    DOB: 12/13/1930  Age: 87 y.o. MRN: [id number]  CC:  Chief Complaint  Patient presents with  . Follow-up  . Hypertension  . Hyperlipidemia    HPI [name] presents for follow up on hypertension and hyperlipidemia.  HTN: Pt denies chest pain, palpitations, dizziness or lower extremity edema. Taking medication as directed without side effects. Checks BP at home and BP readings are higher in the mornings before he takes his medication or shortly after taking BP meds and when he rechecks it in the afternoon it is normal. Am readings: 147/81 pulse 65, 140/80 pulse 64, 144/81 pulse 63, 142/82 pulse 64, 135/82 pulse 72, 140/79 pulse 61; PM readings: 120/78 pulse 66, 116/77, 125/80 pulse 62. Pt continues to follow a low salt diet.  HLD: Patient managing with diet. 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   Elevated liver enzymes: Reports has reduced beers to 2/day.  Past Medical History:  Diagnosis Date  . Asthma   . Depression    wife died 2 months ago. 25-Apr-2011  . Eyelid cancer   . Hyperlipidemia, mixed 05/06/2015  . Hypertension   . Rectal cancer ([place]) 1999    Past Surgical History:  Procedure Laterality Date  . COLON RESECTION  1999  . cyst eyelid  2012  . HAND TUMOR EXCISION  2010   right    Family History  Problem Relation Age of Onset  . Hypertension Father   . Stroke Father   . Suicidality Brother   . Colon cancer Neg Hx   . Rectal cancer Neg Hx     Social History   Socioeconomic History  . Marital status: Married    Spouse name: Not on file  . Number of children: Not on file  . Years of education: Not on file  . Highest education level: Not on file  Occupational History  . Not on file  Tobacco Use  . Smoking status: Never Smoker  . Smokeless tobacco: Never Used  . Tobacco comment: Never Used Tobacco  Vaping Use  . Vaping Use: Never used   Substance and Sexual Activity  . Alcohol use: Yes    Alcohol/week: 16.0 standard drinks    Types: 16 Cans of beer per week  . Drug use: No  . Sexual activity: Not Currently  Other Topics Concern  . Not on file  Social History Narrative  . Not on file   Social Determinants of Health   Financial Resource Strain: Not on file  Food Insecurity: Not on file  Transportation Needs: Not on file  Physical Activity: Not on file  Stress: Not on file  Social Connections: Not on file  Intimate Partner Violence: Not on file    Outpatient Medications Prior to Visit  Medication Sig Dispense Refill  . amLODipine (NORVASC) 5 MG tablet Take 1 tablet (5 mg total) by mouth daily. 90 tablet 0  . betamethasone dipropionate 0.05 % cream APPLY EVERY 12 HOURS TO AFFECTED AREA (RF per DERM) 45 g 0  . budesonide-formoterol (SYMBICORT) 160-4.5 MCG/ACT inhaler Take 2 puffs first thing in am and then another 2 puffs about 12 hours later. 10.2 g 11  . latanoprost (XALATAN) 0.005 % ophthalmic solution Place 1 drop into both eyes at bedtime.    . mupirocin cream (BACTROBAN) 2 % Apply 1 application topically 2 (two) times daily. 15 g 0  . valsartan-hydrochlorothiazide (  DIOVAN-HCT) 320-12.5 MG tablet TAKE 1 TABLET BY MOUTH EVERY DAY 90 tablet 0   No facility-administered medications prior to visit.    No Known Allergies  ROS Review of Systems A fourteen system review of systems was performed and found to be positive as per HPI.   Objective:    Physical Exam General:  Well Developed, well nourished, appropriate for stated age.  Neuro:  Alert and oriented,  extra-ocular muscles intact  HEENT:  Normocephalic, atraumatic, neck supple Skin:  warm, dry. Several bruises noted on his arms. Cardiac:  RRR, S1 S2 Respiratory:  ECTA B/L w/o wheezing, Not using accessory muscles, speaking in full sentences- unlabored. Vascular:  Ext warm, no cyanosis apprec.; cap RF less 2 sec. Psych:  No HI/SI, judgement and  insight good, Euthymic mood. Full Affect.  BP 120/79   Pulse 77   Temp 99.9 F (37.7 C)   Ht _0  (1.727 m)   Wt 135 lb 4.8 oz (61.4 kg)   SpO2 98%   BMI 20.57 kg/m  Wt Readings from Last 3 Encounters:  09/29/20 135 lb 4.8 oz (61.4 kg)  07/16/20 141 lb 3.2 oz (64 kg)  06/25/20 140 lb 14.4 oz (63.9 kg)     Health Maintenance Due  Topic Date Due  . TETANUS/TDAP  Never done  . PNA vac Low Risk Adult (2 of 2 - PPSV23) 11/06/2014  . COVID-19 Vaccine (4 - Booster for Moderna series) 05/23/2020    There are no preventive care reminders to display for this patient.  Lab Results  Component Value Date   TSH 2.030 12/25/2018   Lab Results  Component Value Date   WBC 5.7 05/05/2020   HGB 15.0 05/05/2020   HCT 42.2 05/05/2020   MCV 91.3 05/05/2020   PLT 200 05/05/2020   Lab Results  Component Value Date   NA 133 (L) 05/05/2020   K 4.7 05/05/2020   CHLORIDE 103 05/05/2016   CO2 31 05/05/2020   GLUCOSE 105 (H) 05/05/2020   BUN 11 05/05/2020   CREATININE 0.79 05/05/2020   BILITOT 0.8 05/05/2020   ALKPHOS 55 05/05/2020   AST 50 (H) 05/05/2020   ALT 57 (H) 05/05/2020   PROT 7.4 05/05/2020   ALBUMIN 4.4 05/05/2020   CALCIUM 10.5 (H) 05/05/2020   ANIONGAP 9 05/05/2020   EGFR 71 (L) 05/05/2016   Lab Results  Component Value Date   CHOL 214 (H) 05/05/2020   Lab Results  Component Value Date   HDL 77 05/05/2020   Lab Results  Component Value Date   LDLCALC 121 (H) 05/05/2020   Lab Results  Component Value Date   TRIG 81 05/05/2020   Lab Results  Component Value Date   CHOLHDL 2.8 05/05/2020   Lab Results  Component Value Date   HGBA1C 5.5 12/25/2018      Assessment & Plan:   Problem List Items Addressed This Visit      Cardiovascular and Mediastinum   Essential hypertension - Primary   Relevant Orders   Comp Met (CMET)   CBC w/Diff   Lipid Profile     Other   Hyperlipidemia, mixed (Chronic)   Relevant Orders   Comp Met (CMET)   CBC w/Diff    Lipid Profile    Other Visit Diagnoses    Elevated liver enzymes       Relevant Orders   Comp Met (CMET)     Essential hypertension: -Controlled. -Continue current medication regimen. Will collect CMP for medication monitoring. -  Recommend to continue ambulatory BP/pulse monitoring and check BP 2-3 hours after taking BP medications.  -Recommend adequate hydration especially when working outside. -Will continue to monitor.  Mixed hyperlipidemia: -Last lipid panel: total cholesterol 214, triglycerides 81, HDL 77, LDL 121 -Patient has elevated liver enzymes which are a concern with statin therapy so recommend to continue with dietary and lifestyle modifications. -Will repeat lipid panel today.  Elevated liver enzymes: -Encourage to continue with reduced use of alcohol intake. -Will repeat hepatic function today.  No orders of the defined types were placed in this encounter.   Follow-up: Return in about 4 months (around 01/29/2021) for [facility].   Note:  This note was prepared with assistance of Dragon voice recognition software. Occasional wrong-word or sound-a-like substitutions may have occurred due to the inherent limitations of voice recognition software.  Lorrene Reid, PA-C

## 2020-09-29 NOTE — Patient Instructions (Signed)

## 2020-09-30 LAB — COMPREHENSIVE METABOLIC PANEL
ALT: 30 IU/L (ref 0–44)
AST: 25 IU/L (ref 0–40)
Albumin/Globulin Ratio: 1.3 (ref 1.2–2.2)
Albumin: 4.1 g/dL (ref 3.6–4.6)
Alkaline Phosphatase: 76 IU/L (ref 44–121)
BUN/Creatinine Ratio: 15 (ref 10–24)
BUN: 17 mg/dL (ref 8–27)
Bilirubin Total: 0.5 mg/dL (ref 0.0–1.2)
CO2: 21 mmol/L (ref 20–29)
Calcium: 10 mg/dL (ref 8.6–10.2)
Chloride: 98 mmol/L (ref 96–106)
Creatinine, Ser: 1.13 mg/dL (ref 0.76–1.27)
Globulin, Total: 3.1 g/dL (ref 1.5–4.5)
Glucose: 94 mg/dL (ref 65–99)
Potassium: 4.5 mmol/L (ref 3.5–5.2)
Sodium: 135 mmol/L (ref 134–144)
Total Protein: 7.2 g/dL (ref 6.0–8.5)
eGFR: 63 mL/min/{1.73_m2} (ref >59)

## 2020-09-30 LAB — CBC WITH DIFFERENTIAL/PLATELET
Basophils Absolute: 0.1 10*3/uL (ref 0.0–0.2)
Basos: 1 %
EOS (ABSOLUTE): 0.3 10*3/uL (ref 0.0–0.4)
Eos: 5 %
Hematocrit: 41.5 % (ref 37.5–51.0)
Hemoglobin: 14.4 g/dL (ref 13.0–17.7)
Immature Grans (Abs): 0 10*3/uL (ref 0.0–0.1)
Immature Granulocytes: 0 %
Lymphocytes Absolute: 1.2 10*3/uL (ref 0.7–3.1)
Lymphs: 22 %
MCH: 32.1 pg (ref 26.6–33.0)
MCHC: 34.7 g/dL (ref 31.5–35.7)
MCV: 93 fL (ref 79–97)
Monocytes Absolute: 0.7 10*3/uL (ref 0.1–0.9)
Monocytes: 13 %
Neutrophils Absolute: 3 10*3/uL (ref 1.4–7.0)
Neutrophils: 59 %
Platelets: 214 10*3/uL (ref 150–450)
RBC: 4.48 x10E6/uL (ref 4.14–5.80)
RDW: 12.2 % (ref 11.6–15.4)
WBC: 5.2 10*3/uL (ref 3.4–10.8)

## 2020-09-30 LAB — LIPID PANEL
Chol/HDL Ratio: 3.1 ratio (ref 0.0–5.0)
Cholesterol, Total: 164 mg/dL (ref 100–199)
HDL: 53 mg/dL (ref >39)
LDL Chol Calc (NIH): 96 mg/dL (ref 0–99)
Triglycerides: 79 mg/dL (ref 0–149)
VLDL Cholesterol Cal: 15 mg/dL (ref 5–40)

## 2020-10-01 ENCOUNTER — Telehealth: Payer: Self-pay | Admitting: Physician Assistant

## 2020-10-01 NOTE — Telephone Encounter (Signed)
Patient is returning a call he states and is not sure who called him. Please advise, thanks.

## 2020-10-01 NOTE — Telephone Encounter (Signed)
Called patient back, left msg for him to call our office for lab results. AS< CMA

## 2020-10-20 DIAGNOSIS — H524 Presbyopia: Secondary | ICD-10-CM | POA: Diagnosis not present

## 2020-11-13 ENCOUNTER — Other Ambulatory Visit: Payer: Self-pay | Admitting: Physician Assistant

## 2020-11-13 DIAGNOSIS — I10 Essential (primary) hypertension: Secondary | ICD-10-CM

## 2020-11-23 DIAGNOSIS — Z85828 Personal history of other malignant neoplasm of skin: Secondary | ICD-10-CM | POA: Diagnosis not present

## 2020-11-23 DIAGNOSIS — L570 Actinic keratosis: Secondary | ICD-10-CM | POA: Diagnosis not present

## 2020-11-23 DIAGNOSIS — M6740 Ganglion, unspecified site: Secondary | ICD-10-CM | POA: Diagnosis not present

## 2020-11-23 DIAGNOSIS — L821 Other seborrheic keratosis: Secondary | ICD-10-CM | POA: Diagnosis not present

## 2020-11-23 DIAGNOSIS — C44612 Basal cell carcinoma of skin of right upper limb, including shoulder: Secondary | ICD-10-CM | POA: Diagnosis not present

## 2020-11-23 DIAGNOSIS — D692 Other nonthrombocytopenic purpura: Secondary | ICD-10-CM | POA: Diagnosis not present

## 2020-11-23 DIAGNOSIS — C44712 Basal cell carcinoma of skin of right lower limb, including hip: Secondary | ICD-10-CM | POA: Diagnosis not present

## 2020-12-04 ENCOUNTER — Other Ambulatory Visit: Payer: Self-pay | Admitting: Physician Assistant

## 2020-12-04 DIAGNOSIS — I10 Essential (primary) hypertension: Secondary | ICD-10-CM

## 2021-01-26 ENCOUNTER — Encounter: Payer: Self-pay | Admitting: Internal Medicine

## 2021-02-03 ENCOUNTER — Ambulatory Visit (INDEPENDENT_AMBULATORY_CARE_PROVIDER_SITE_OTHER): Payer: Medicare HMO | Admitting: Physician Assistant

## 2021-02-03 ENCOUNTER — Encounter: Payer: Self-pay | Admitting: Physician Assistant

## 2021-02-03 ENCOUNTER — Other Ambulatory Visit: Payer: Self-pay

## 2021-02-03 VITALS — BP 140/80 | HR 76 | Temp 97.6°F | Ht 68.0 in | Wt 138.0 lb

## 2021-02-03 DIAGNOSIS — E782 Mixed hyperlipidemia: Secondary | ICD-10-CM | POA: Diagnosis not present

## 2021-02-03 DIAGNOSIS — Z23 Encounter for immunization: Secondary | ICD-10-CM | POA: Diagnosis not present

## 2021-02-03 DIAGNOSIS — Z0000 Encounter for general adult medical examination without abnormal findings: Secondary | ICD-10-CM | POA: Diagnosis not present

## 2021-02-03 DIAGNOSIS — I351 Nonrheumatic aortic (valve) insufficiency: Secondary | ICD-10-CM | POA: Diagnosis not present

## 2021-02-03 DIAGNOSIS — I10 Essential (primary) hypertension: Secondary | ICD-10-CM

## 2021-02-03 DIAGNOSIS — Z1329 Encounter for screening for other suspected endocrine disorder: Secondary | ICD-10-CM | POA: Diagnosis not present

## 2021-02-03 DIAGNOSIS — Z131 Encounter for screening for diabetes mellitus: Secondary | ICD-10-CM | POA: Diagnosis not present

## 2021-02-03 MED ORDER — VALSARTAN-HYDROCHLOROTHIAZIDE 320-12.5 MG PO TABS: 1.0000 | ORAL_TABLET | Freq: Every day | ORAL | 1 refills | Status: DC

## 2021-02-03 MED ORDER — AMLODIPINE BESYLATE 5 MG PO TABS: 5.0000 mg | ORAL_TABLET | Freq: Every day | ORAL | 1 refills | Status: DC

## 2021-02-03 NOTE — Patient Instructions (Signed)
Preventive Care 29 Years and Older, Male Preventive care refers to lifestyle choices and visits with your health care provider that can promote health and wellness. This includes: A yearly physical exam. This is also called an annual wellness visit. Regular dental and eye exams. Immunizations. Screening for certain conditions. Healthy lifestyle choices, such as: Eating a healthy diet. Getting regular exercise. Not using drugs or products that contain nicotine and tobacco. Limiting alcohol use. What can I expect for my preventive care visit? Physical exam Your health care provider will check your: Height and weight. These may be used to calculate your BMI (body mass index). BMI is a measurement that tells if you are at a healthy weight. Heart rate and blood pressure. Body temperature. Skin for abnormal spots. Counseling Your health care provider may ask you questions about your: Past medical problems. Family's medical history. Alcohol, tobacco, and drug use. Emotional well-being. Home life and relationship well-being. Sexual activity. Diet, exercise, and sleep habits. History of falls. Memory and ability to understand (cognition). Work and work Statistician. Access to firearms. What immunizations do I need? Vaccines are usually given at various ages, according to a schedule. Your health care provider will recommend vaccines for you based on your age, medical history, and lifestyle or other factors, such as travel or where you work. What tests do I need? Blood tests Lipid and cholesterol levels. These may be checked every 5 years, or more often depending on your overall health. Hepatitis C test. Hepatitis B test. Screening Lung cancer screening. You may have this screening every year starting at age 85 if you have a 30-pack-year history of smoking and currently smoke or have quit within the past 15 years. Colorectal cancer screening. All adults should have this screening  starting at age 85 and continuing until age 85. Your health care provider may recommend screening at age 85 if you are at increased risk. You will have tests every 1-10 years, depending on your results and the type of screening test. Prostate cancer screening. Recommendations will vary depending on your family history and other risks. Genital exam to check for testicular cancer or hernias. Diabetes screening. This is done by checking your blood sugar (glucose) after you have not eaten for a while (fasting). You may have this done every 1-3 years. Abdominal aortic aneurysm (AAA) screening. You may need this if you are a current or former smoker. STD (sexually transmitted disease) testing, if you are at risk. Follow these instructions at home: Eating and drinking  Eat a diet that includes fresh fruits and vegetables, whole grains, lean protein, and low-fat dairy products. Limit your intake of foods with high amounts of sugar, saturated fats, and salt. Take vitamin and mineral supplements as recommended by your health care provider. Do not drink alcohol if your health care provider tells you not to drink. If you drink alcohol: Limit how much you have to 0-2 drinks a day. Be aware of how much alcohol is in your drink. In the [place] one drink equals one 12 oz bottle of beer (355 mL), one 5 oz glass of wine (148 mL), or one 1 oz glass of hard liquor (44 mL). Lifestyle Take daily care of your teeth and gums. Brush your teeth every morning and night with fluoride toothpaste. Floss one time each day. Stay active. Exercise for at least 30 minutes 5 or more days each week. Do not use any products that contain nicotine or tobacco, such as cigarettes, e-cigarettes, and chewing tobacco. If  you need help quitting, ask your health care provider. Do not use drugs. If you are sexually active, practice safe sex. Use a condom or other form of protection to prevent STIs (sexually transmitted infections). Talk  with your health care provider about taking a low-dose aspirin or statin. Find healthy ways to cope with stress, such as: Meditation, yoga, or listening to music. Journaling. Talking to a trusted person. Spending time with friends and family. Safety Always wear your seat belt while driving or riding in a vehicle. Do not drive: If you have been drinking alcohol. Do not ride with someone who has been drinking. When you are tired or distracted. While texting. Wear a helmet and other protective equipment during sports activities. If you have firearms in your house, make sure you follow all gun safety procedures. What's next? Visit your health care provider once a year for an annual wellness visit. Ask your health care provider how often you should have your eyes and teeth checked. Stay up to date on all vaccines. This information is not intended to replace advice given to you by your health care provider. Make sure you discuss any questions you have with your health care provider. Document Revised: 06/26/2020 Document Reviewed: 04/12/2018 Elsevier Patient Education  2022 [employer].

## 2021-02-03 NOTE — Progress Notes (Signed)
Subjective:   [name] is a 87 y.o. male who presents for Medicare Annual/Subsequent preventive examination.  Review of Systems    General:   No F/C, wt loss Pulm:   No DIB, SOB, pleuritic chest pain, +intermittent wheezing Card:  No CP, palpitations Abd:  No n/v/d or pain Ext:  No inc edema from baseline  Objective:    Today's Vitals   02/03/21 0837  BP: (!) 163/78  Pulse: 76  Temp: 97.6 F (36.4 C)  SpO2: 98%  Weight: 138 lb (62.6 kg)  Height: 5\' 8"  (1.727 m)   Body mass index is 20.98 kg/m.  Advanced Directives 05/05/2020 05/06/2019 05/04/2018 12/19/2017 05/04/2017 05/05/2016 05/06/2015  Does Patient Have a Medical Advance Directive? Yes Yes Yes Yes Yes Yes No  Type of Paramedic of [place];Living will [place];Living will Living will;Healthcare Power of [place];Living will [place];Living will [place];Living will -  Does patient want to make changes to medical advance directive? No - Patient declined No - Patient declined - No - Patient declined - No - Patient declined -  Copy of [place] in Chart? No - copy requested - - - No - copy requested No - copy requested -  Would patient like information on creating a medical advance directive? - - - - - - No - patient declined information    Current Medications (verified) Outpatient Encounter Medications as of 02/03/2021  Medication Sig   amLODipine (NORVASC) 5 MG tablet Take 1 tablet (5 mg total) by mouth daily.   betamethasone dipropionate 0.05 % cream APPLY EVERY 12 HOURS TO AFFECTED AREA (RF per DERM)   budesonide-formoterol (SYMBICORT) 160-4.5 MCG/ACT inhaler Take 2 puffs first thing in am and then another 2 puffs about 12 hours later.   latanoprost (XALATAN) 0.005 % ophthalmic solution Place 1 drop into both eyes at bedtime.   mupirocin cream (BACTROBAN) 2 % Apply 1 application topically 2  (two) times daily.   valsartan-hydrochlorothiazide (DIOVAN-HCT) 320-12.5 MG tablet Take 1 tablet by mouth daily.   [DISCONTINUED] amLODipine (NORVASC) 5 MG tablet TAKE 1 TABLET (5 MG TOTAL) BY MOUTH DAILY.   [DISCONTINUED] valsartan-hydrochlorothiazide (DIOVAN-HCT) 320-12.5 MG tablet TAKE 1 TABLET BY MOUTH EVERY DAY   No facility-administered encounter medications on file as of 02/03/2021.    Allergies (verified) Patient has no known allergies.   History: Past Medical History:  Diagnosis Date   Asthma    Depression    wife died 2 months ago. 2011/04/24   Eyelid cancer    Hyperlipidemia, mixed 05/06/2015   Hypertension    Rectal cancer ([place]) 1999   Past Surgical History:  Procedure Laterality Date   COLON RESECTION  1999   cyst eyelid  2012   HAND TUMOR EXCISION  2010   right   Family History  Problem Relation Age of Onset   Hypertension Father    Stroke Father    Suicidality Brother    Colon cancer Neg Hx    Rectal cancer Neg Hx    Social History   Socioeconomic History   Marital status: Married    Spouse name: Not on file   Number of children: Not on file   Years of education: Not on file   Highest education level: Not on file  Occupational History   Not on file  Tobacco Use   Smoking status: Never   Smokeless tobacco: Never  Tobacco comments:    Never Used Tobacco  Vaping Use   Vaping Use: Never used  Substance and Sexual Activity   Alcohol use: Yes    Alcohol/week: 16.0 standard drinks    Types: 16 Cans of beer per week   Drug use: No   Sexual activity: Not Currently  Other Topics Concern   Not on file  Social History Narrative   Not on file   Social Determinants of Health   Financial Resource Strain: Not on file  Food Insecurity: Not on file  Transportation Needs: Not on file  Physical Activity: Not on file  Stress: Not on file  Social Connections: Not on file    Tobacco Counseling Counseling given: Not Answered Tobacco comments: Never  Used Tobacco    Diabetic? no     Activities of Daily Living In your present state of health, do you have any difficulty performing the following activities: 02/03/2021 09/29/2020  Hearing? N N  Vision? N N  Difficulty concentrating or making decisions? N N  Walking or climbing stairs? N N  Dressing or bathing? N N  Doing errands, shopping? N N  Some recent data might be hidden    Patient Care Team: Lorrene Reid, PA-C as PCP - General (Physician Assistant) Leonard Downing, MD as Referring Physician (Family Medicine) Tanda Rockers, MD as Consulting Physician (Pulmonary Disease) Griselda Miner, MD as Consulting Physician (Dermatology) Ramonita Lab, Jovita Kussmaul, MD as Referring Physician (Ophthalmology) Danella Sensing, MD as Consulting Physician (Dermatology)  Indicate any recent Medical Services you may have received from other than [facility] providers in the past year (date may be approximate).     Assessment:   This is a routine wellness examination for [place].  Hearing/Vision screen No results found.  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 Follow low fat and carbohydrate diet.   Goals Addressed   None   Depression Screen PHQ 2/9 Scores 02/03/2021 09/29/2020 07/16/2020 06/25/2020 03/25/2020 11/20/2019 07/19/2019  PHQ - 2 Score 0 0 0 0 0 3 0  PHQ- 9 Score 0 0 0 1 0 3 0    Fall Risk Fall Risk  02/03/2021 09/29/2020 07/16/2020 06/25/2020 03/25/2020  Falls in the past year? 0 1 1 0 0  Number falls in past yr: 0 0 0 - -  Injury with Fall? 0 1 - - -  Risk for fall due to : - History of fall(s) - - -  Follow up Falls evaluation completed Falls evaluation completed Falls evaluation completed Falls evaluation completed Falls evaluation completed    [place]:  Any stairs in or around the home? No  If so, are there any without handrails? No  Home free of loose throw rugs in  walkways, pet beds, electrical cords, etc? Yes  Adequate lighting in your home to reduce risk of falls? Yes   ASSISTIVE DEVICES UTILIZED TO PREVENT FALLS:  Life alert? No  Use of a cane, walker or w/c? No  Grab bars in the bathroom? No  Shower chair or bench in shower? No  Elevated toilet seat or a handicapped toilet? No   TIMED UP AND GO:  Was the test performed? Yes .  Length of time to ambulate 10 feet: 15 sec.   Gait steady and fast without use of assistive device  Cognitive Function: wnl's  6CIT Screen 02/03/2021  What Year? 0 points  What month? 0 points  What  time? 0 points  Count back from 20 0 points  Months in reverse 0 points  Repeat phrase 0 points  Total Score 0    Immunizations Immunization History  Administered Date(s) Administered   Fluad Quad(high Dose 65+) 01/21/2019, 01/29/2020   Hepatitis B, adult 01/07/2018   Influenza Split 12/31/2016   Influenza-Unspecified 02/05/2014, 12/31/2017, 01/21/2019, 01/21/2020   Moderna Sars-Covid-2 Vaccination 05/13/2019, 06/10/2019, 02/21/2020   Pneumococcal Conjugate-13 11/05/2013   Zoster Recombinat (Shingrix) 09/03/2017, 11/16/2017   Zoster, Live 08/30/2017    TDAP status: Due, Education has been provided regarding the importance of this vaccine. Advised may receive this vaccine at local pharmacy or Health Dept. Aware to provide a copy of the vaccination record if obtained from local pharmacy or Health Dept. Verbalized acceptance and understanding.  Flu Vaccine status: Completed at today's visit  Pneumococcal vaccine status: Up to date  Covid-19 vaccine status: Completed vaccines  Qualifies for Shingles Vaccine? No   Zostavax completed Yes   Shingrix Completed?: Yes  Screening Tests Health Maintenance  Topic Date Due   TETANUS/TDAP  Never done   COVID-19 Vaccine (4 - Booster for Moderna series) 05/15/2020   INFLUENZA VACCINE  11/30/2020   Zoster Vaccines- Shingrix  Completed   HPV VACCINES  Aged Out     Health Maintenance  Health Maintenance Due  Topic Date Due   TETANUS/TDAP  Never done   COVID-19 Vaccine (4 - Booster for Moderna series) 05/15/2020   INFLUENZA VACCINE  11/30/2020    Colorectal cancer screening: No longer required.   Lung Cancer Screening: (Low Dose CT Chest recommended if Age 27-80 years, 30 pack-year currently smoking OR have quit w/in 15years.) does not qualify.   Lung Cancer Screening Referral: no   Additional Screening:  Hepatitis C Screening: does qualify; pt declined   Vision Screening: Recommended annual ophthalmology exams for early detection of glaucoma and other disorders of the eye. Is the patient up to date with their annual eye exam?  Yes  Who is the provider or what is the name of the office in which the patient attends annual eye exams? [facility] If pt is not established with a provider, would they like to be referred to a provider to establish care? No .   Dental Screening: Recommended annual dental exams for proper oral hygiene  Community Resource Referral / Chronic Care Management: CRR required this visit?  No   CCM required this visit?  No      Plan:  -Will obtain fasting labs. -Patient agreeable to influenza vaccine. -Follow up with pulmonology as scheduled. Continue to follow up with other specialists. -Follow up in 6 months for HTN, HLD  I have personally reviewed and noted the following in the patient's chart:   Medical and social history Use of alcohol, tobacco or illicit drugs  Current medications and supplements including opioid prescriptions. Patient is not currently taking opioid prescriptions. Functional ability and status Nutritional status Physical activity Advanced directives List of other physicians Hospitalizations, surgeries, and ER visits in previous 12 months Vitals Screenings to include cognitive, depression, and falls Referrals and appointments  In addition, I have reviewed and  discussed with patient certain preventive protocols, quality metrics, and best practice recommendations. A written personalized care plan for preventive services as well as general preventive health recommendations were provided to patient.     Lorrene Reid, PA-C   02/03/2021

## 2021-02-04 LAB — COMPREHENSIVE METABOLIC PANEL
ALT: 47 IU/L — ABNORMAL HIGH (ref 0–44)
AST: 37 IU/L (ref 0–40)
Albumin/Globulin Ratio: 1.7 (ref 1.2–2.2)
Albumin: 4.8 g/dL — ABNORMAL HIGH (ref 3.6–4.6)
Alkaline Phosphatase: 76 IU/L (ref 44–121)
BUN/Creatinine Ratio: 13 (ref 10–24)
BUN: 12 mg/dL (ref 8–27)
Bilirubin Total: 0.9 mg/dL (ref 0.0–1.2)
CO2: 23 mmol/L (ref 20–29)
Calcium: 10.3 mg/dL — ABNORMAL HIGH (ref 8.6–10.2)
Chloride: 93 mmol/L — ABNORMAL LOW (ref 96–106)
Creatinine, Ser: 0.91 mg/dL (ref 0.76–1.27)
Globulin, Total: 2.8 g/dL (ref 1.5–4.5)
Glucose: 93 mg/dL (ref 70–99)
Potassium: 4.6 mmol/L (ref 3.5–5.2)
Sodium: 134 mmol/L (ref 134–144)
Total Protein: 7.6 g/dL (ref 6.0–8.5)
eGFR: 82 mL/min/{1.73_m2} (ref >59)

## 2021-02-04 LAB — CBC WITH DIFFERENTIAL/PLATELET
Basophils Absolute: 0.1 10*3/uL (ref 0.0–0.2)
Basos: 1 %
EOS (ABSOLUTE): 0.4 10*3/uL (ref 0.0–0.4)
Eos: 6 %
Hematocrit: 45 % (ref 37.5–51.0)
Hemoglobin: 15.6 g/dL (ref 13.0–17.7)
Immature Grans (Abs): 0 10*3/uL (ref 0.0–0.1)
Immature Granulocytes: 0 %
Lymphocytes Absolute: 1.1 10*3/uL (ref 0.7–3.1)
Lymphs: 19 %
MCH: 32.2 pg (ref 26.6–33.0)
MCHC: 34.7 g/dL (ref 31.5–35.7)
MCV: 93 fL (ref 79–97)
Monocytes Absolute: 0.7 10*3/uL (ref 0.1–0.9)
Monocytes: 12 %
Neutrophils Absolute: 3.6 10*3/uL (ref 1.4–7.0)
Neutrophils: 62 %
Platelets: 221 10*3/uL (ref 150–450)
RBC: 4.84 x10E6/uL (ref 4.14–5.80)
RDW: 12.6 % (ref 11.6–15.4)
WBC: 5.9 10*3/uL (ref 3.4–10.8)

## 2021-02-04 LAB — TSH: TSH: 1.4 u[IU]/mL (ref 0.450–4.500)

## 2021-02-04 LAB — LIPID PANEL
Chol/HDL Ratio: 3 ratio (ref 0.0–5.0)
Cholesterol, Total: 191 mg/dL (ref 100–199)
HDL: 64 mg/dL (ref >39)
LDL Chol Calc (NIH): 112 mg/dL — ABNORMAL HIGH (ref 0–99)
Triglycerides: 85 mg/dL (ref 0–149)
VLDL Cholesterol Cal: 15 mg/dL (ref 5–40)

## 2021-02-04 LAB — HEMOGLOBIN A1C
Est. average glucose Bld gHb Est-mCnc: 105 mg/dL
Hgb A1c MFr Bld: 5.3 % (ref 4.8–5.6)

## 2021-02-24 ENCOUNTER — Other Ambulatory Visit: Payer: Self-pay

## 2021-02-24 DIAGNOSIS — I10 Essential (primary) hypertension: Secondary | ICD-10-CM

## 2021-02-25 ENCOUNTER — Other Ambulatory Visit: Payer: Medicare HMO

## 2021-02-25 ENCOUNTER — Other Ambulatory Visit: Payer: Self-pay

## 2021-02-25 DIAGNOSIS — I10 Essential (primary) hypertension: Secondary | ICD-10-CM

## 2021-02-26 LAB — COMPREHENSIVE METABOLIC PANEL
ALT: 41 IU/L (ref 0–44)
AST: 32 IU/L (ref 0–40)
Albumin/Globulin Ratio: 1.7 (ref 1.2–2.2)
Albumin: 4.2 g/dL (ref 3.6–4.6)
Alkaline Phosphatase: 71 IU/L (ref 44–121)
BUN/Creatinine Ratio: 10 (ref 10–24)
BUN: 8 mg/dL (ref 8–27)
Bilirubin Total: 0.4 mg/dL (ref 0.0–1.2)
CO2: 20 mmol/L (ref 20–29)
Calcium: 9.7 mg/dL (ref 8.6–10.2)
Chloride: 95 mmol/L — ABNORMAL LOW (ref 96–106)
Creatinine, Ser: 0.78 mg/dL (ref 0.76–1.27)
Globulin, Total: 2.5 g/dL (ref 1.5–4.5)
Glucose: 96 mg/dL (ref 70–99)
Potassium: 4.6 mmol/L (ref 3.5–5.2)
Sodium: 134 mmol/L (ref 134–144)
Total Protein: 6.7 g/dL (ref 6.0–8.5)
eGFR: 86 mL/min/{1.73_m2} (ref >59)

## 2021-03-01 ENCOUNTER — Ambulatory Visit: Payer: Medicare HMO | Admitting: Internal Medicine

## 2021-03-16 ENCOUNTER — Encounter: Payer: Self-pay | Admitting: Internal Medicine

## 2021-03-16 ENCOUNTER — Ambulatory Visit: Payer: Medicare HMO | Admitting: Internal Medicine

## 2021-03-16 ENCOUNTER — Other Ambulatory Visit: Payer: Self-pay

## 2021-03-16 DIAGNOSIS — J4540 Moderate persistent asthma, uncomplicated: Secondary | ICD-10-CM | POA: Diagnosis not present

## 2021-03-16 MED ORDER — ALBUTEROL SULFATE HFA 108 (90 BASE) MCG/ACT IN AERS: INHALATION_SPRAY | RESPIRATORY_TRACT | 11 refills | Status: AC

## 2021-03-16 NOTE — Patient Instructions (Addendum)
Plan A = Automatic = Always=   Symbicort 160 Take 2 puffs first thing in am and then another 2 puffs about 12 hours later.    Work on inhaler technique:  relax and gently blow all the way out then take a nice smooth full deep breath back in, triggering the inhaler at same time you start breathing in.  Hold for up to 5 seconds if you can. Blow out thru nose. Rinse and gargle with water when done.  If mouth or throat bother you at all,  try brushing teeth/gums/tongue with arm and hammer toothpaste/ make a slurry and gargle and spit out.    Plan B = Backup (to supplement plan A, not to replace it) Only use your albuterol inhaler as a rescue medication to be used if you can't catch your breath by resting or doing a relaxed purse lip breathing pattern.  - The less you use it, the better it will work when you need it. - Ok to use the inhaler up to 2 puffs  every 4 hours if you must but call for appointment if use goes up over your usual need - Don't leave home without it !!  (think of it like the spare tire for your car)   Ok to try albuterol 15 min before an activity (on alternating days)  that you know would usually make you short of breath and see if it makes any difference and if makes none then don't take albuterol after activity unless you can't catch your breath as this means it's the resting that helps, not the albuterol.      Please schedule a follow up visit in 12  months but call sooner if needed

## 2021-03-16 NOTE — Progress Notes (Signed)
Subjective:     Patient ID: [name], male   DOB: Oct 25, 1930,    MRN: [id number]    Brief patient profile:  3 yowm never smoker with hx suggestive of longstanding asthma and  h/o cough attributed to to gerd and cc  variable sob/wheeze  for which inhalers seem  to help referred to pulmonary clinic 09/14/2017 by Dr Arelia Sneddon with worse noct wheeze ? since starting ACEi and refractory to symb  160 dosed at 2bid though baseline hfa technique suboptimal.     History of Present Illness  09/14/2017 1st [facility] office visit/ [name]   Chief Complaint  Patient presents with   Consult    SOB with excertion. wheezing at night.   doe x decades worse with fence post splitting and rapid walking assoc with noct wheeze while on ACEi and better with  use of Symbicort esp at bedtime but also used with "wheezing"  Am of ov (w/in 12 hours of last symb 160  dose)  rec Symbicort 160 can be used up to 2 puffs every 12 hours Work on inhaler technique:   Stop lisinopril -hctz and start diovan 160-25 mg daily - call if problems  Please schedule a follow up office visit in 6 weeks, call sooner if needed with all medications /inhalers/ solutions in hand so we can verify exactly what you are taking. This includes all medications from all doctors and over the counters  - PFTs on return    02/28/2020  f/u ov/[place] re: mod asthma on symbicort 160 2bid with poor baseline hfa Chief Complaint  Patient presents with   Follow-up    No complaints  Dyspnea:  MMRC1 = can walk nl pace, flat grade, can't hurry or go uphills or steps s sob   Cough: none Sleeping: electric bed is stuck at 30 degrees one pillow  SABA use: symbicort 160 never uses saba  02: none  Rec Continue symbicort 160 Take 2 puffs first thing in am and then another 2 puffs about 12 hours later.  Work on inhaler technique:   03/16/2021  f/u ov/[name] re: mod asthma  maint on symbicort 160 2bid   Chief Complaint  Patient presents with    Follow-up    Sob with exertion, at baseline.   Dyspnea:  main problem is digging / does fine walking  Cough: none  Sleeping: 30 degrees  SABA use: none  02: none  Covid status:   vax x 4    No obvious day to day or daytime variability or assoc excess/ purulent sputum or mucus plugs or hemoptysis or cp or chest tightness, subjective wheeze or overt sinus or hb symptoms.   Sleeping  without nocturnal  or early am exacerbation  of respiratory  c/o's or need for noct saba. Also denies any obvious fluctuation of symptoms with weather or environmental changes or other aggravating or alleviating factors except as outlined above   No unusual exposure hx or h/o childhood pna/ asthma or knowledge of premature birth.  Current Allergies, Complete Past Medical History, Past Surgical History, Family History, and Social History were reviewed in [employer] record.  ROS  The following are not active complaints unless bolded Hoarseness, sore throat, dysphagia, dental problems, itching, sneezing,  nasal congestion or discharge of excess mucus or purulent secretions, ear ache,   fever, chills, sweats, unintended wt loss or wt gain, classically pleuritic or exertional cp,  orthopnea pnd or arm/hand swelling  or leg swelling, presyncope, palpitations,  abdominal pain, anorexia, nausea, vomiting, diarrhea  or change in bowel habits or change in bladder habits, change in stools or change in urine, dysuria, hematuria,  rash, arthralgias, visual complaints, headache, numbness, weakness or ataxia or problems with walking or coordination,  change in mood or  memory.        Current Meds  Medication Sig   amLODipine (NORVASC) 5 MG tablet Take 1 tablet (5 mg total) by mouth daily.   betamethasone dipropionate 0.05 % cream APPLY EVERY 12 HOURS TO AFFECTED AREA (RF per DERM)   budesonide-formoterol (SYMBICORT) 160-4.5 MCG/ACT inhaler Take 2 puffs first thing in am and then another 2 puffs about 12  hours later.   latanoprost (XALATAN) 0.005 % ophthalmic solution Place 1 drop into both eyes at bedtime.   mupirocin cream (BACTROBAN) 2 % Apply 1 application topically 2 (two) times daily.   valsartan-hydrochlorothiazide (DIOVAN-HCT) 320-12.5 MG tablet Take 1 tablet by mouth daily.          Objective:   Physical Exam   03/16/2021      138   02/28/2020     146  05/07/2018          152 02/01/2018        148   10/30/2017         143   09/14/17 145 lb 9.6 oz (66 kg)  05/04/17 139 lb 8 oz (63.3 kg)  05/05/16 144 lb (65.3 kg)  Vital signs reviewed  03/16/2021  - Note at rest 02 sats  97% on RA   General appearance:    amb wm nad  HEENT : pt wearing mask not removed for exam due to covid - 19 concerns.   NECK :  without JVD/Nodes/TM/ nl carotid upstrokes bilaterally   LUNGS: no acc muscle use,  Min barrel  contour chest wall with bilateral  slightly decreased bs s audible wheeze and  without cough on insp or exp maneuvers and min  Hyperresonant  to  percussion bilaterally     CV:  RRR  no s3 or murmur or increase in P2, and no edema   ABD:  soft and nontender with pos end  insp Hoover's  in the supine position. No bruits or organomegaly appreciated, bowel sounds nl  MS:   Nl gait/  ext warm without deformities, calf tenderness, cyanosis or clubbing No obvious joint restrictions   SKIN: warm and dry without lesions    NEURO:  alert, approp, nl sensorium with  no motor or cerebellar deficits apparent.          Assessment:

## 2021-03-18 ENCOUNTER — Encounter: Payer: Self-pay | Admitting: Internal Medicine

## 2021-03-18 NOTE — Assessment & Plan Note (Signed)
Never smoker PFT's  10/06/09   FEV1 2.56 (100 % ) ratio 51  p no % improvement from saba p ? prior to study with DLCO  116%  - PFT's  10/30/2017  FEV1 1.80 (39 % ) ratio 55   p 39 % improvement from saba p nothing prior to study with DLCO  72 % corrects to 89 % for alv volume   - 10/30/2017  symbicort 160   2 bid - 02/01/2018  After extensive coaching inhaler device,  effectiveness =    75% from baseline 25% (very late insp p trigger, short Ti) - Spirometry 02/01/2018  FEV1 1.3 (59%)  Ratio 56 p am symb 160 with poor hfa   - Allergy profile 02/01/2018 >  Eos 0.3/  IgE  35 RAST neg  - Alpha one AT rec 05/07/2018  > declined  - 02/28/2020  After extensive coaching inhaler device,  effectiveness =    50% (delayed trigger, good Ti)  > rx spacer  - 03/16/2021  After extensive coaching inhaler device,  effectiveness =  80% (short ti)    All goals of chronic asthma control met including optimal function and elimination of symptoms with minimal need for rescue therapy.  Contingencies discussed in full including contacting this office immediately if not controlling the symptoms using the rule of two's.     Re saba: Re SABA :  I spent extra time with pt today reviewing appropriate use of albuterol for prn use on exertion with the following points: 1) saba is for relief of sob that does not improve by walking a slower pace or resting but rather if the pt does not improve after trying this first. 2) If the pt is convinced, as many are, that saba helps recover from activity faster then it's easy to tell if this is the case by re-challenging : ie stop, take the inhaler, then p 5 minutes try the exact same activity (intensity of workload) that just caused the symptoms and see if they are substantially diminished or not after saba 3) if there is an activity that reproducibly causes the symptoms, try the saba 15 min before the activity on alternate days   If in fact the saba really does help, then fine to continue to use  it prn but advised may need to look closer at the maintenance regimen being used to achieve better control of airways disease with exertion.    F/u yearly at this point is appropriate, call sooner prn          Each maintenance medication was reviewed in detail including emphasizing most importantly the difference between maintenance and prns and under what circumstances the prns are to be triggered using an action plan format where appropriate.  Total time for H and P, chart review, counseling, reviewing hfa device(s) and generating customized AVS unique to this office visit / same day charting = 20 min

## 2021-03-29 DIAGNOSIS — H25013 Cortical age-related cataract, bilateral: Secondary | ICD-10-CM | POA: Diagnosis not present

## 2021-03-29 DIAGNOSIS — H401131 Primary open-angle glaucoma, bilateral, mild stage: Secondary | ICD-10-CM | POA: Diagnosis not present

## 2021-03-29 DIAGNOSIS — H2513 Age-related nuclear cataract, bilateral: Secondary | ICD-10-CM | POA: Diagnosis not present

## 2021-03-30 DIAGNOSIS — H2513 Age-related nuclear cataract, bilateral: Secondary | ICD-10-CM | POA: Diagnosis not present

## 2021-03-30 DIAGNOSIS — H401131 Primary open-angle glaucoma, bilateral, mild stage: Secondary | ICD-10-CM | POA: Diagnosis not present

## 2021-03-30 DIAGNOSIS — H25013 Cortical age-related cataract, bilateral: Secondary | ICD-10-CM | POA: Diagnosis not present

## 2021-03-30 DIAGNOSIS — H18413 Arcus senilis, bilateral: Secondary | ICD-10-CM | POA: Diagnosis not present

## 2021-03-30 DIAGNOSIS — H2512 Age-related nuclear cataract, left eye: Secondary | ICD-10-CM | POA: Diagnosis not present

## 2021-03-31 DIAGNOSIS — L570 Actinic keratosis: Secondary | ICD-10-CM | POA: Diagnosis not present

## 2021-03-31 DIAGNOSIS — L821 Other seborrheic keratosis: Secondary | ICD-10-CM | POA: Diagnosis not present

## 2021-03-31 DIAGNOSIS — L812 Freckles: Secondary | ICD-10-CM | POA: Diagnosis not present

## 2021-03-31 DIAGNOSIS — Z85828 Personal history of other malignant neoplasm of skin: Secondary | ICD-10-CM | POA: Diagnosis not present

## 2021-03-31 DIAGNOSIS — L409 Psoriasis, unspecified: Secondary | ICD-10-CM | POA: Diagnosis not present

## 2021-03-31 DIAGNOSIS — L309 Dermatitis, unspecified: Secondary | ICD-10-CM | POA: Diagnosis not present

## 2021-04-06 DIAGNOSIS — H409 Unspecified glaucoma: Secondary | ICD-10-CM | POA: Insufficient documentation

## 2021-04-06 DIAGNOSIS — H02132 Senile ectropion of right lower eyelid: Secondary | ICD-10-CM | POA: Diagnosis not present

## 2021-04-06 DIAGNOSIS — H16211 Exposure keratoconjunctivitis, right eye: Secondary | ICD-10-CM | POA: Diagnosis not present

## 2021-04-06 DIAGNOSIS — H02532 Eyelid retraction right lower eyelid: Secondary | ICD-10-CM | POA: Diagnosis not present

## 2021-04-06 DIAGNOSIS — H04521 Eversion of right lacrimal punctum: Secondary | ICD-10-CM | POA: Diagnosis not present

## 2021-04-06 DIAGNOSIS — J45909 Unspecified asthma, uncomplicated: Secondary | ICD-10-CM | POA: Insufficient documentation

## 2021-04-06 DIAGNOSIS — H02112 Cicatricial ectropion of right lower eyelid: Secondary | ICD-10-CM | POA: Diagnosis not present

## 2021-04-06 DIAGNOSIS — H04123 Dry eye syndrome of bilateral lacrimal glands: Secondary | ICD-10-CM | POA: Diagnosis not present

## 2021-05-05 ENCOUNTER — Inpatient Hospital Stay: Payer: Medicare HMO

## 2021-05-05 ENCOUNTER — Inpatient Hospital Stay: Payer: Medicare HMO | Attending: Hematology & Oncology | Admitting: Hematology & Oncology

## 2021-05-05 ENCOUNTER — Encounter: Payer: Self-pay | Admitting: Hematology & Oncology

## 2021-05-05 ENCOUNTER — Other Ambulatory Visit: Payer: Self-pay

## 2021-05-05 VITALS — BP 163/83 | HR 85 | Temp 97.8°F | Resp 18 | Wt 133.0 lb

## 2021-05-05 DIAGNOSIS — Z85048 Personal history of other malignant neoplasm of rectum, rectosigmoid junction, and anus: Secondary | ICD-10-CM | POA: Diagnosis not present

## 2021-05-05 DIAGNOSIS — Z923 Personal history of irradiation: Secondary | ICD-10-CM | POA: Diagnosis not present

## 2021-05-05 DIAGNOSIS — C20 Malignant neoplasm of rectum: Secondary | ICD-10-CM

## 2021-05-05 DIAGNOSIS — Z9221 Personal history of antineoplastic chemotherapy: Secondary | ICD-10-CM | POA: Insufficient documentation

## 2021-05-05 LAB — CBC WITH DIFFERENTIAL (CANCER CENTER ONLY)
Abs Immature Granulocytes: 0 K/uL (ref 0.00–0.07)
Band Neutrophils: 0 %
Basophils Absolute: 0.1 K/uL (ref 0.0–0.1)
Basophils Relative: 1 %
Eosinophils Absolute: 0.4 K/uL (ref 0.0–0.5)
Eosinophils Relative: 6 %
HCT: 40.9 % (ref 39.0–52.0)
Hemoglobin: 14.8 g/dL (ref 13.0–17.0)
Immature Granulocytes: 0 %
Lymphocytes Relative: 23 %
Lymphs Abs: 1.3 K/uL (ref 0.7–4.0)
MCH: 32.5 pg (ref 26.0–34.0)
MCHC: 36.2 g/dL — ABNORMAL HIGH (ref 30.0–36.0)
MCV: 89.9 fL (ref 80.0–100.0)
Monocytes Absolute: 0.5 K/uL (ref 0.1–1.0)
Monocytes Relative: 9 %
Neutro Abs: 3.4 K/uL (ref 1.7–7.7)
Neutrophils Relative %: 60 %
Platelet Count: 226 K/uL (ref 150–400)
RBC: 4.55 MIL/uL (ref 4.22–5.81)
RDW: 11.4 % — ABNORMAL LOW (ref 11.5–15.5)
WBC Count: 5.6 K/uL (ref 4.0–10.5)
nRBC: 0 % (ref 0.0–0.2)
nRBC: 0 /100{WBCs}

## 2021-05-05 LAB — CMP (CANCER CENTER ONLY)
ALT: 32 U/L (ref 0–44)
AST: 32 U/L (ref 15–41)
Albumin: 4.2 g/dL (ref 3.5–5.0)
Alkaline Phosphatase: 54 U/L (ref 38–126)
Anion gap: 12 (ref 5–15)
BUN: 12 mg/dL (ref 8–23)
CO2: 24 mmol/L (ref 22–32)
Calcium: 9.8 mg/dL (ref 8.9–10.3)
Chloride: 96 mmol/L — ABNORMAL LOW (ref 98–111)
Creatinine: 0.84 mg/dL (ref 0.61–1.24)
GFR, Estimated: >60 mL/min (ref >60)
Glucose, Bld: 103 mg/dL — ABNORMAL HIGH (ref 70–99)
Potassium: 4.9 mmol/L (ref 3.5–5.1)
Sodium: 132 mmol/L — ABNORMAL LOW (ref 135–145)
Total Bilirubin: 0.9 mg/dL (ref 0.3–1.2)
Total Protein: 7.8 g/dL (ref 6.5–8.1)

## 2021-05-05 LAB — CEA (IN HOUSE-CHCC): CEA (CHCC-In House): 1.59 ng/mL (ref 0.00–5.00)

## 2021-05-05 LAB — LACTATE DEHYDROGENASE: LDH: 128 U/L (ref 98–192)

## 2021-05-05 NOTE — Progress Notes (Signed)
Hematology and Oncology Follow Up Visit  [name] [id number] 09-16-1930 88 y.o. 05/05/2021   Principle Diagnosis:  Stage II (T3N0M0) rectal cancer  Current Therapy:   Observation     Interim History:  [name] is back for follow-up.  We see him once a year.  Since we last saw him, he has been doing well.  He tries to avoid people.  He needs to have cataract surgery for the left eye.  The ophthalmologist will do his right eye because of some past issues with the eyelid and with some shingles.  He has had no problems with his heart.  He has had no palpitations.  He has had no issues with bowels or bladder.  There has been no bleeding.  He has had no leg swelling.  His last CEA level was 1.76.  Overall, I would say his performance status is probably ECOG 1.    Medications:  Current Outpatient Medications:    albuterol (PROAIR HFA) 108 (90 Base) MCG/ACT inhaler, 2 puffs every 4 hours as needed only  if your can't catch your breath, Disp: 18 g, Rfl: 11   amLODipine (NORVASC) 5 MG tablet, Take 1 tablet (5 mg total) by mouth daily., Disp: 90 tablet, Rfl: 1   betamethasone dipropionate 0.05 % cream, APPLY EVERY 12 HOURS TO AFFECTED AREA (RF per DERM), Disp: 45 g, Rfl: 0   budesonide-formoterol (SYMBICORT) 160-4.5 MCG/ACT inhaler, Take 2 puffs first thing in am and then another 2 puffs about 12 hours later., Disp: 10.2 g, Rfl: 11   ketorolac (ACULAR) 0.5 % ophthalmic solution, Place 1 drop into the left eye 4 (four) times daily., Disp: , Rfl:    latanoprost (XALATAN) 0.005 % ophthalmic solution, Place 1 drop into both eyes at bedtime., Disp: , Rfl:    moxifloxacin (VIGAMOX) 0.5 % ophthalmic solution, Place 1 drop into the left eye 4 (four) times daily., Disp: , Rfl:    mupirocin cream (BACTROBAN) 2 %, Apply 1 application topically 2 (two) times daily., Disp: 15 g, Rfl: 0   prednisoLONE acetate (PRED FORTE) 1 % ophthalmic suspension, Place 1 drop into the left eye 4 (four) times  daily., Disp: , Rfl:    triamcinolone cream (KENALOG) 0.1 %, PLEASE SEE ATTACHED FOR DETAILED DIRECTIONS, Disp: , Rfl:    valsartan-hydrochlorothiazide (DIOVAN-HCT) 320-12.5 MG tablet, Take 1 tablet by mouth daily., Disp: 90 tablet, Rfl: 1  Allergies: No Known Allergies  Past Medical History, Surgical history, Social history, and Family History were reviewed and updated.  Review of Systems: Review of Systems  Constitutional: Negative.   HENT: Negative.    Eyes:  Positive for blurred vision and redness.  Respiratory: Negative.    Cardiovascular: Negative.   Gastrointestinal: Negative.   Genitourinary: Negative.   Musculoskeletal: Negative.   Skin: Negative.   Neurological: Negative.   Endo/Heme/Allergies: Negative.   Psychiatric/Behavioral: Negative.     Physical Exam:  weight is 133 lb (60.3 kg). His oral temperature is 97.8 F (36.6 C). His blood pressure is 163/83 (abnormal) and his pulse is 85. His respiration is 18 and oxygen saturation is 97%.   Physical Exam Vitals reviewed.  HENT:     Head: Normocephalic and atraumatic.  Eyes:     Pupils: Pupils are equal, round, and reactive to light.  Cardiovascular:     Rate and Rhythm: Normal rate and regular rhythm.     Heart sounds: Normal heart sounds.  Pulmonary:     Effort: Pulmonary effort is  normal.     Breath sounds: Normal breath sounds.  Abdominal:     General: Bowel sounds are normal.     Palpations: Abdomen is soft.  Musculoskeletal:        General: No tenderness or deformity. Normal range of motion.     Cervical back: Normal range of motion.  Lymphadenopathy:     Cervical: No cervical adenopathy.  Skin:    General: Skin is warm and dry.     Findings: No erythema or rash.  Neurological:     Mental Status: He is alert and oriented to person, place, and time.  Psychiatric:        Behavior: Behavior normal.        Thought Content: Thought content normal.        Judgment: Judgment normal.     Lab Results   Component Value Date   WBC 5.6 05/05/2021   HGB 14.8 05/05/2021   HCT 40.9 05/05/2021   MCV 89.9 05/05/2021   PLT 226 05/05/2021     Chemistry      Component Value Date/Time   NA 134 02/25/2021 0805   NA 142 05/04/2017 0741   NA 140 05/05/2016 0747   K 4.6 02/25/2021 0805   K 4.1 05/04/2017 0741   K 4.1 05/05/2016 0747   CL 95 (L) 02/25/2021 0805   CL 99 05/04/2017 0741   CO2 20 02/25/2021 0805   CO2 29 05/04/2017 0741   CO2 27 05/05/2016 0747   BUN 8 02/25/2021 0805   BUN 13 05/04/2017 0741   BUN 20.5 05/05/2016 0747   CREATININE 0.78 02/25/2021 0805   CREATININE 0.79 05/05/2020 0749   CREATININE 0.8 05/04/2017 0741   CREATININE 1.0 05/05/2016 0747   GLU 91 08/10/2017 0000      Component Value Date/Time   CALCIUM 9.7 02/25/2021 0805   CALCIUM 9.7 05/04/2017 0741   CALCIUM 10.2 05/05/2016 0747   ALKPHOS 71 02/25/2021 0805   ALKPHOS 62 05/04/2017 0741   ALKPHOS 79 05/05/2016 0747   AST 32 02/25/2021 0805   AST 50 (H) 05/05/2020 0749   AST 30 05/05/2016 0747   ALT 41 02/25/2021 0805   ALT 57 (H) 05/05/2020 0749   ALT 36 05/04/2017 0741   ALT 46 05/05/2016 0747   BILITOT 0.4 02/25/2021 0805   BILITOT 0.8 05/05/2020 0749   BILITOT 0.74 05/05/2016 0747      Impression and Plan: [name] is 88 yo gentleman with a remote history of rectal cancer. He has stage II disease. He had neoadjuvant chemotherapy and radiation therapy.  He is clearly cured of this. However, he likes to come back to see [place] yearly. He just gets "peace of mind" with, to see [place].  As always, will see him back in 1 year.  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  Volanda Napoleon, MD 1/4/20238:20 AM

## 2021-06-07 DIAGNOSIS — H2512 Age-related nuclear cataract, left eye: Secondary | ICD-10-CM | POA: Diagnosis not present

## 2021-06-24 DIAGNOSIS — L400 Psoriasis vulgaris: Secondary | ICD-10-CM | POA: Diagnosis not present

## 2021-06-24 DIAGNOSIS — L404 Guttate psoriasis: Secondary | ICD-10-CM | POA: Diagnosis not present

## 2021-06-24 DIAGNOSIS — Z85828 Personal history of other malignant neoplasm of skin: Secondary | ICD-10-CM | POA: Diagnosis not present

## 2021-07-19 DIAGNOSIS — H2511 Age-related nuclear cataract, right eye: Secondary | ICD-10-CM | POA: Diagnosis not present

## 2021-08-08 ENCOUNTER — Other Ambulatory Visit: Payer: Self-pay | Admitting: Physician Assistant

## 2021-08-08 DIAGNOSIS — I10 Essential (primary) hypertension: Secondary | ICD-10-CM

## 2021-08-24 ENCOUNTER — Ambulatory Visit (INDEPENDENT_AMBULATORY_CARE_PROVIDER_SITE_OTHER): Payer: Medicare HMO | Admitting: Physician Assistant

## 2021-08-24 ENCOUNTER — Encounter: Payer: Self-pay | Admitting: Physician Assistant

## 2021-08-24 VITALS — BP 138/78 | HR 72 | Temp 97.5°F | Ht 68.0 in | Wt 137.0 lb

## 2021-08-24 DIAGNOSIS — I10 Essential (primary) hypertension: Secondary | ICD-10-CM

## 2021-08-24 DIAGNOSIS — J4540 Moderate persistent asthma, uncomplicated: Secondary | ICD-10-CM | POA: Diagnosis not present

## 2021-08-24 DIAGNOSIS — E782 Mixed hyperlipidemia: Secondary | ICD-10-CM | POA: Diagnosis not present

## 2021-08-24 MED ORDER — VALSARTAN-HYDROCHLOROTHIAZIDE 320-12.5 MG PO TABS: 1.0000 | ORAL_TABLET | Freq: Every day | ORAL | 1 refills | Status: DC

## 2021-08-24 NOTE — Assessment & Plan Note (Addendum)
-  BP elevated in office. BP repeated and improved. ?-Recommend to resume ambulatory BP monitoring. ?-Will collect CMP for medication monitoring. ?-Will continue to monitor. ?

## 2021-08-24 NOTE — Patient Instructions (Signed)

## 2021-08-24 NOTE — Assessment & Plan Note (Signed)
-  Last lipid panel: HDL 64, LDL 112 ?-Will repeat lipid panel. ?-Discussed heart healthy diet. Continue to stay as active as possible. ?-Will continue to monitor. ?

## 2021-08-24 NOTE — Assessment & Plan Note (Signed)
Followed by pulmonology 

## 2021-08-24 NOTE — Progress Notes (Signed)
?Established patient visit ? ? ?Patient: [name]   DOB: May 05, 1930   88 y.o. Male  MRN: [id number] ?Visit Date: 08/24/2021 ? ?Chief Complaint  ?Patient presents with  ? Follow-up  ? Hypertension  ? Hyperlipidemia  ? ?Subjective  ?  ?HPI  ?Patient presents for chronic f/up. Patient denies shortness of breath. States during this season tries to avoid the pollen which can trigger wheezing. Uses albuterol when needed. No nighttime use of inhaler.  ? ?HTN: Pt denies chest pain, palpitations, dizziness or lower extremity swelling. Taking medication as directed without side effects. Patient reports has not been checking blood pressure at home lately due to recovering from cataract surgery. States his blood pressure is usually higher in the mornings and when rechecks it in the afternoon it is better.  ? ?HLD: Pt managing with diet. Patient stays busy with working outside and plans to start a garden. No diet changes. ? ? ?Medications: ?Outpatient Medications Prior to Visit  ?Medication Sig  ? albuterol (PROAIR HFA) 108 (90 Base) MCG/ACT inhaler 2 puffs every 4 hours as needed only  if your can't catch your breath  ? amLODipine (NORVASC) 5 MG tablet TAKE 1 TABLET (5 MG TOTAL) BY MOUTH DAILY.  ? betamethasone dipropionate 0.05 % cream APPLY EVERY 12 HOURS TO AFFECTED AREA (RF per DERM)  ? budesonide-formoterol (SYMBICORT) 160-4.5 MCG/ACT inhaler Take 2 puffs first thing in am and then another 2 puffs about 12 hours later.  ? ketorolac (ACULAR) 0.5 % ophthalmic solution Place 1 drop into the left eye 4 (four) times daily.  ? latanoprost (XALATAN) 0.005 % ophthalmic solution Place 1 drop into both eyes at bedtime.  ? moxifloxacin (VIGAMOX) 0.5 % ophthalmic solution Place 1 drop into the left eye 4 (four) times daily.  ? mupirocin cream (BACTROBAN) 2 % Apply 1 application topically 2 (two) times daily.  ? prednisoLONE acetate (PRED FORTE) 1 % ophthalmic suspension Place 1 drop into the left eye 4 (four) times daily.  ?  triamcinolone cream (KENALOG) 0.1 % PLEASE SEE ATTACHED FOR DETAILED DIRECTIONS  ? [DISCONTINUED] valsartan-hydrochlorothiazide (DIOVAN-HCT) 320-12.5 MG tablet Take 1 tablet by mouth daily.  ? ?No facility-administered medications prior to visit.  ? ? ?Review of Systems ?Review of Systems:  ?A fourteen system review of systems was performed and found to be positive as per HPI. ? ? ? ?Last CBC ?Lab Results  ?Component Value Date  ? WBC 5.6 05/05/2021  ? HGB 14.8 05/05/2021  ? HCT 40.9 05/05/2021  ? MCV 89.9 05/05/2021  ? MCH 32.5 05/05/2021  ? RDW 11.4 (L) 05/05/2021  ? PLT 226 05/05/2021  ? ?Last metabolic panel ?Lab Results  ?Component Value Date  ? GLUCOSE 103 (H) 05/05/2021  ? NA 132 (L) 05/05/2021  ? K 4.9 05/05/2021  ? CL 96 (L) 05/05/2021  ? CO2 24 05/05/2021  ? BUN 12 05/05/2021  ? CREATININE 0.84 05/05/2021  ? GFRNONAA >60 05/05/2021  ? CALCIUM 9.8 05/05/2021  ? PROT 7.8 05/05/2021  ? ALBUMIN 4.2 05/05/2021  ? LABGLOB 2.5 02/25/2021  ? AGRATIO 1.7 02/25/2021  ? BILITOT 0.9 05/05/2021  ? ALKPHOS 54 05/05/2021  ? AST 32 05/05/2021  ? ALT 32 05/05/2021  ? ANIONGAP 12 05/05/2021  ? ?Last lipids ?Lab Results  ?Component Value Date  ? CHOL 191 02/03/2021  ? HDL 64 02/03/2021  ? LDLCALC 112 (H) 02/03/2021  ? TRIG 85 02/03/2021  ? CHOLHDL 3.0 02/03/2021  ? ?Last hemoglobin A1c ?Lab Results  ?Component Value  Date  ? HGBA1C 5.3 02/03/2021  ? ?Last thyroid functions ?Lab Results  ?Component Value Date  ? TSH 1.400 02/03/2021  ? ?Last vitamin D ?Lab Results  ?Component Value Date  ? VD25OH 22 (L) 05/06/2015  ? ?  ?  Objective  ?  ?BP 138/78   Pulse 72   Temp (!) 97.5 ?F (36.4 ?C)   Ht $R'5\' 8"'Hr$  (1.727 m)   Wt 137 lb (62.1 kg)   SpO2 98%   BMI 20.83 kg/m?  ?BP Readings from Last 3 Encounters:  ?08/24/21 138/78  ?05/05/21 (!) 163/83  ?03/16/21 118/78  ? ?Wt Readings from Last 3 Encounters:  ?08/24/21 137 lb (62.1 kg)  ?05/05/21 133 lb (60.3 kg)  ?03/16/21 138 lb 9.6 oz (62.9 kg)  ? ? ?Physical Exam  ?General:  Well  Developed, well nourished, appropriate for stated age.  ?Neuro:  Alert and oriented,  extra-ocular muscles intact  ?HEENT:  Normocephalic, atraumatic, neck supple  ?Skin:  no gross rash, warm, pink. ?Cardiac:  RRR, S1 S2 ?Respiratory: CTA B/L  ?Vascular:  Ext warm, no cyanosis apprec.; cap RF less 2 sec. ?Psych:  No HI/SI, judgement and insight good, Euthymic mood. Full Affect. ? ? ?No results found for any visits on 08/24/21. ? Assessment & Plan  ?  ? ? ?Problem List Items Addressed This Visit   ? ?  ? Cardiovascular and Mediastinum  ? Essential hypertension - Primary  ?  -BP elevated in office. BP repeated and improved. ?-Recommend to resume ambulatory BP monitoring. ?-Will collect CMP for medication monitoring. ?-Will continue to monitor. ? ?  ?  ? Relevant Medications  ? valsartan-hydrochlorothiazide (DIOVAN-HCT) 320-12.5 MG tablet  ? Other Relevant Orders  ? CBC w/Diff  ? Comp Met (CMET)  ?  ? Respiratory  ? Chronic asthma, moderate persistent, uncomplicated  ?  -Followed by pulmonology. ? ?  ?  ?  ? Other  ? Hyperlipidemia, mixed (Chronic)  ?  -Last lipid panel: HDL 64, LDL 112 ?-Will repeat lipid panel. ?-Discussed heart healthy diet. Continue to stay as active as possible. ?-Will continue to monitor. ? ?  ?  ? Relevant Medications  ? valsartan-hydrochlorothiazide (DIOVAN-HCT) 320-12.5 MG tablet  ? Other Relevant Orders  ? Lipid Profile  ? CBC w/Diff  ? Comp Met (CMET)  ? ? ?Return in about 6 months (around 02/23/2022) for [place] and [place].  ?   ? ? ? ?Lorrene Reid, PA-C  ?[facility] ?[phone] (phone) ?[phone] (fax) ? ?[facility] ?

## 2021-08-25 LAB — LIPID PANEL
Chol/HDL Ratio: 2.4 ratio (ref 0.0–5.0)
Cholesterol, Total: 210 mg/dL — ABNORMAL HIGH (ref 100–199)
HDL: 87 mg/dL (ref >39)
LDL Chol Calc (NIH): 110 mg/dL — ABNORMAL HIGH (ref 0–99)
Triglycerides: 72 mg/dL (ref 0–149)
VLDL Cholesterol Cal: 13 mg/dL (ref 5–40)

## 2021-08-25 LAB — COMPREHENSIVE METABOLIC PANEL
ALT: 41 IU/L (ref 0–44)
AST: 40 IU/L (ref 0–40)
Albumin/Globulin Ratio: 1.5 (ref 1.2–2.2)
Albumin: 4.5 g/dL (ref 3.6–4.6)
Alkaline Phosphatase: 74 IU/L (ref 44–121)
BUN/Creatinine Ratio: 17 (ref 10–24)
BUN: 15 mg/dL (ref 8–27)
Bilirubin Total: 0.9 mg/dL (ref 0.0–1.2)
CO2: 21 mmol/L (ref 20–29)
Calcium: 10 mg/dL (ref 8.6–10.2)
Chloride: 91 mmol/L — ABNORMAL LOW (ref 96–106)
Creatinine, Ser: 0.88 mg/dL (ref 0.76–1.27)
Globulin, Total: 3.1 g/dL (ref 1.5–4.5)
Glucose: 94 mg/dL (ref 70–99)
Potassium: 4.8 mmol/L (ref 3.5–5.2)
Sodium: 131 mmol/L — ABNORMAL LOW (ref 134–144)
Total Protein: 7.6 g/dL (ref 6.0–8.5)
eGFR: 83 mL/min/{1.73_m2} (ref >59)

## 2021-08-25 LAB — CBC WITH DIFFERENTIAL/PLATELET
Basophils Absolute: 0.1 10*3/uL (ref 0.0–0.2)
Basos: 1 %
EOS (ABSOLUTE): 0.3 10*3/uL (ref 0.0–0.4)
Eos: 5 %
Hematocrit: 45 % (ref 37.5–51.0)
Hemoglobin: 16 g/dL (ref 13.0–17.7)
Immature Grans (Abs): 0 10*3/uL (ref 0.0–0.1)
Immature Granulocytes: 0 %
Lymphocytes Absolute: 0.9 10*3/uL (ref 0.7–3.1)
Lymphs: 16 %
MCH: 32.3 pg (ref 26.6–33.0)
MCHC: 35.6 g/dL (ref 31.5–35.7)
MCV: 91 fL (ref 79–97)
Monocytes Absolute: 0.6 10*3/uL (ref 0.1–0.9)
Monocytes: 11 %
Neutrophils Absolute: 3.5 10*3/uL (ref 1.4–7.0)
Neutrophils: 67 %
Platelets: 207 10*3/uL (ref 150–450)
RBC: 4.95 x10E6/uL (ref 4.14–5.80)
RDW: 12.7 % (ref 11.6–15.4)
WBC: 5.3 10*3/uL (ref 3.4–10.8)

## 2021-09-20 DIAGNOSIS — Z85828 Personal history of other malignant neoplasm of skin: Secondary | ICD-10-CM | POA: Diagnosis not present

## 2021-09-20 DIAGNOSIS — L400 Psoriasis vulgaris: Secondary | ICD-10-CM | POA: Diagnosis not present

## 2021-09-20 DIAGNOSIS — Z79899 Other long term (current) drug therapy: Secondary | ICD-10-CM | POA: Diagnosis not present

## 2021-10-27 ENCOUNTER — Ambulatory Visit (INDEPENDENT_AMBULATORY_CARE_PROVIDER_SITE_OTHER): Payer: Medicare HMO | Admitting: Physician Assistant

## 2021-10-27 VITALS — BP 137/71 | HR 80 | Temp 97.7°F | Ht 68.0 in | Wt 142.0 lb

## 2021-10-27 DIAGNOSIS — L0390 Cellulitis, unspecified: Secondary | ICD-10-CM | POA: Diagnosis not present

## 2021-10-27 DIAGNOSIS — S81811A Laceration without foreign body, right lower leg, initial encounter: Secondary | ICD-10-CM

## 2021-10-27 MED ORDER — CEFTRIAXONE SODIUM 1 G IJ SOLR
1.0000 g | Freq: Once | INTRAMUSCULAR | Status: AC
  Administered 2021-10-27: 1 g via INTRAMUSCULAR

## 2021-10-27 MED ORDER — MUPIROCIN 2 % EX OINT: 1.0000 | TOPICAL_OINTMENT | Freq: Two times a day (BID) | CUTANEOUS | 0 refills | Status: DC

## 2021-10-27 MED ORDER — DOXYCYCLINE HYCLATE 100 MG PO TABS: 100.0000 mg | ORAL_TABLET | Freq: Two times a day (BID) | ORAL | 0 refills | Status: DC

## 2021-10-27 NOTE — Progress Notes (Signed)
  Established patient acute visit   Patient: [name]   DOB: Jul 12, 1930   88 y.o. Male  MRN: [id number] Visit Date: 10/27/2021  Chief Complaint  Patient presents with   Laceration   Subjective    HPI  Patient presents for c/o right lower extremity wound secondary to cow stepping on him 5 days ago. Reports cleaned wound with water and soap. Has not applied any dressing, has left the wound open. Reports yesterday his leg started to get red and swollen. No fever, chills, chest pain, shortness of breath or right leg pain.   15+8 cm     Medications: Outpatient Medications Prior to Visit  Medication Sig   albuterol (PROAIR HFA) 108 (90 Base) MCG/ACT inhaler 2 puffs every 4 hours as needed only  if your can't catch your breath   amLODipine (NORVASC) 5 MG tablet TAKE 1 TABLET (5 MG TOTAL) BY MOUTH DAILY.   betamethasone dipropionate 0.05 % cream APPLY EVERY 12 HOURS TO AFFECTED AREA (RF per DERM)   budesonide-formoterol (SYMBICORT) 160-4.5 MCG/ACT inhaler Take 2 puffs first thing in am and then another 2 puffs about 12 hours later.   ketorolac (ACULAR) 0.5 % ophthalmic solution Place 1 drop into the left eye 4 (four) times daily.   latanoprost (XALATAN) 0.005 % ophthalmic solution Place 1 drop into both eyes at bedtime.   moxifloxacin (VIGAMOX) 0.5 % ophthalmic solution Place 1 drop into the left eye 4 (four) times daily.   mupirocin cream (BACTROBAN) 2 % Apply 1 application topically 2 (two) times daily.   prednisoLONE acetate (PRED FORTE) 1 % ophthalmic suspension Place 1 drop into the left eye 4 (four) times daily.   triamcinolone cream (KENALOG) 0.1 % PLEASE SEE ATTACHED FOR DETAILED DIRECTIONS   valsartan-hydrochlorothiazide (DIOVAN-HCT) 320-12.5 MG tablet Take 1 tablet by mouth daily.   No facility-administered medications prior to visit.    Review of Systems Review of Systems:  A fourteen system review of systems was performed and found to be positive as per HPI.       Objective    BP 137/71   Pulse 80   Temp 97.7 F (36.5 C)   Ht '5\' 8"'$  (1.727 m)   Wt 142 lb (64.4 kg)   SpO2 99%   BMI 21.59 kg/m    Physical Exam  General:  Well Developed, well nourished, appropriate for stated age.  Neuro:  Alert and oriented,  extra-ocular muscles intact  HEENT:  Normocephalic, atraumatic, neck supple  Skin:  approximately 23 cm laceration with black discoloration and malodor, redness, warmth and swelling of right lower leg. Cardiac:  RRR Respiratory: CTA B/L  Psych:  No HI/SI, judgement and insight good, Euthymic mood. Full Affect.   No results found for any visits on 10/27/21.  Assessment & Plan     Patient presenting with s/sx consistent with cellulitis secondary to open wound. Wound culture collected. Will place urgent referral to wound care, concerned of extensive infection present. Will administer ceftriaxone 1 gram in office and start patient on doxycycline 100 mg BID x 14 days. Advised to apply topical mupirocin ointment 2 % twice daily x 7-10 days. Instructed patient to return tomorrow to redress wound.    No follow-ups on file.        Lorrene Reid, PA-C  [facility] [phone] (phone) [phone] (fax)  [place]

## 2021-10-27 NOTE — Patient Instructions (Signed)
Laceration Care, Adult A laceration is a cut that may go through all layers of the skin. The cut may also go into the tissue that is right under the skin. Some cuts heal on their own. Other cuts need to be closed with stitches (sutures), staples, skin adhesive strips, or skin glue. Taking care of your cut lowers your risk of infection, helps your injury heal better, and may prevent scarring. General tips Keep your wound clean and dry. Do not scratch or pick at your wound. Wash your hands with soap and water for at least 20 seconds before and after touching your wound or changing your bandage (dressing). If you cannot use soap and water, use hand sanitizer. Do not usedisinfectants or antiseptics, such as rubbing alcohol, to clean your wound unless told by your doctor. If you were given a bandage, change it at least once a day, or as told by your doctor. You should also change it if it gets wet or dirty. How to take care of your cut If your doctor used stitches or staples: Keep the wound fully dry for the first 24 hours, or as told by your doctor. After that, you may take a shower or a bath. Do not soak the wound in water until after the stitches or staples have been taken out. Clean the wound once a day, or as told by your doctor. To do this: Wash the wound with soap and water. Rinse the wound with water to remove all soap. Pat the wound dry with a clean towel. Do not rub the wound. After you clean the wound, put a thin layer of antibiotic ointment, another ointment, or a nonstick bandage on it as told by your doctor. This will help to: Prevent infection. Keep the bandage from sticking to the wound. Have your stitches or staples taken out as told by your doctor. If your doctor used skin adhesive strips: Do not get the skin adhesive strips wet. You can take a shower or a bath, but keep the wound dry. If the wound gets wet, pat it dry with a clean towel. Do not rub the wound. Skin adhesive strips  fall off on their own. You can trim the strips as the wound heals. Do not take off any strips that are still stuck to the wound unless told by your doctor. The strips will fall off after a while. If your doctor used skin glue: You may take a shower or a bath, but try to keep the wound dry. Do not soak the wound in water. After you take a shower or a bath, pat the wound dry with a clean towel. Do not rub the wound. Do not do any activities that will make you sweat a lot until the skin glue has fallen off. Do not apply liquid, cream, or ointment medicine to your wound while the skin glue is still on. If a bandage is placed over the wound, do not put tape right on top of the skin glue. Do not pick at the glue. The skin glue usually stays on for 5-10 days. Then, it falls off the skin. Follow these instructions at home: Medicines Take over-the-counter and prescription medicines only as told by your doctor. If you were prescribed an antibiotic medicine, take or apply it as told by your doctor. Do not stop using it even if you start to feel better. Managing pain and swelling If told, put ice on the injured area. To do this: Put ice in a   plastic bag. Place a towel between your skin and the bag. Leave the ice on for 20 minutes, 2-3 times a day. Take off the ice if your skin turns bright red. This is very important. If you cannot feel pain, heat, or cold, you have a greater risk of damage to the area. Raise the injured area above the level of your heart while you are sitting or lying down. General instructions  Avoid any activity that could make your wound reopen. Check your wound every day for signs of infection. Check for: More redness, swelling, or pain. Fluid or blood. Warmth. Pus or a bad smell. Keep all follow-up visits. Contact a doctor if: You got a tetanus shot and you have any of these problems where the needle went in: Swelling. Very bad pain. Redness. Bleeding. A wound that was  closed breaks open. You have a fever. You have any of these signs of infection in your wound: More redness, swelling, or pain. Fluid or blood. Warmth. Pus or a bad smell. You see something coming out of the wound, such as wood or glass. Medicine does not make your pain go away. You notice a change in the color of your skin near your wound. You need to change the bandage often. You have a new rash. You lose feeling (have numbness) around the wound. Get help right away if: You have very bad swelling around the wound. Your pain suddenly gets worse and is very bad. You have painful lumps near the wound or on skin anywhere on your body. You have a red streak going away from your wound. The wound is on your hand or foot, and: You cannot move a finger or toe. Your fingers or toes look pale or bluish. Summary A laceration is a cut that may go through all layers of the skin. The cut may also go into the tissue right under the skin. Some cuts heal on their own. Others need to be closed with stitches, staples, skin adhesive strips, or skin glue. Follow your doctor's instructions for caring for your cut. Proper care of a cut lowers the risk of infection, helps the cut heal better, and may prevent scarring. This information is not intended to replace advice given to you by your health care provider. Make sure you discuss any questions you have with your health care provider. Document Revised: 06/25/2020 Document Reviewed: 06/25/2020 Elsevier Patient Education  2023 [employer]  

## 2021-10-28 ENCOUNTER — Inpatient Hospital Stay (HOSPITAL_COMMUNITY)
Admission: EM | Admit: 2021-10-28 | Discharge: 2021-10-31 | DRG: 603 | Disposition: A | Discharge status: Home / Self Care | Payer: Medicare HMO | Source: Ambulatory Visit | Attending: Family Medicine | Admitting: Family Medicine

## 2021-10-28 ENCOUNTER — Other Ambulatory Visit: Payer: Self-pay

## 2021-10-28 ENCOUNTER — Emergency Department (HOSPITAL_COMMUNITY): Payer: Medicare HMO

## 2021-10-28 ENCOUNTER — Encounter (HOSPITAL_COMMUNITY): Payer: Self-pay

## 2021-10-28 ENCOUNTER — Ambulatory Visit: Payer: Medicare HMO

## 2021-10-28 ENCOUNTER — Observation Stay (HOSPITAL_COMMUNITY): Payer: Medicare HMO

## 2021-10-28 DIAGNOSIS — L308 Other specified dermatitis: Secondary | ICD-10-CM | POA: Diagnosis present

## 2021-10-28 DIAGNOSIS — K5900 Constipation, unspecified: Secondary | ICD-10-CM | POA: Diagnosis not present

## 2021-10-28 DIAGNOSIS — I10 Essential (primary) hypertension: Secondary | ICD-10-CM | POA: Diagnosis not present

## 2021-10-28 DIAGNOSIS — S2241XA Multiple fractures of ribs, right side, initial encounter for closed fracture: Secondary | ICD-10-CM | POA: Diagnosis present

## 2021-10-28 DIAGNOSIS — S80811A Abrasion, right lower leg, initial encounter: Secondary | ICD-10-CM | POA: Diagnosis present

## 2021-10-28 DIAGNOSIS — Z823 Family history of stroke: Secondary | ICD-10-CM

## 2021-10-28 DIAGNOSIS — Z66 Do not resuscitate: Secondary | ICD-10-CM | POA: Diagnosis present

## 2021-10-28 DIAGNOSIS — E871 Hypo-osmolality and hyponatremia: Secondary | ICD-10-CM | POA: Diagnosis present

## 2021-10-28 DIAGNOSIS — I679 Cerebrovascular disease, unspecified: Secondary | ICD-10-CM | POA: Diagnosis not present

## 2021-10-28 DIAGNOSIS — L03115 Cellulitis of right lower limb: Secondary | ICD-10-CM | POA: Diagnosis not present

## 2021-10-28 DIAGNOSIS — K449 Diaphragmatic hernia without obstruction or gangrene: Secondary | ICD-10-CM

## 2021-10-28 DIAGNOSIS — S71101A Unspecified open wound, right thigh, initial encounter: Secondary | ICD-10-CM | POA: Diagnosis not present

## 2021-10-28 DIAGNOSIS — R109 Unspecified abdominal pain: Secondary | ICD-10-CM

## 2021-10-28 DIAGNOSIS — R519 Headache, unspecified: Secondary | ICD-10-CM | POA: Diagnosis not present

## 2021-10-28 DIAGNOSIS — M7989 Other specified soft tissue disorders: Secondary | ICD-10-CM | POA: Diagnosis not present

## 2021-10-28 DIAGNOSIS — Z85048 Personal history of other malignant neoplasm of rectum, rectosigmoid junction, and anus: Secondary | ICD-10-CM | POA: Diagnosis not present

## 2021-10-28 DIAGNOSIS — I351 Nonrheumatic aortic (valve) insufficiency: Secondary | ICD-10-CM | POA: Diagnosis not present

## 2021-10-28 DIAGNOSIS — J4540 Moderate persistent asthma, uncomplicated: Secondary | ICD-10-CM | POA: Diagnosis not present

## 2021-10-28 DIAGNOSIS — M25551 Pain in right hip: Secondary | ICD-10-CM | POA: Diagnosis not present

## 2021-10-28 DIAGNOSIS — Z79899 Other long term (current) drug therapy: Secondary | ICD-10-CM

## 2021-10-28 DIAGNOSIS — W5522XA Struck by cow, initial encounter: Secondary | ICD-10-CM | POA: Diagnosis present

## 2021-10-28 DIAGNOSIS — Z0389 Encounter for observation for other suspected diseases and conditions ruled out: Secondary | ICD-10-CM | POA: Diagnosis not present

## 2021-10-28 DIAGNOSIS — L0390 Cellulitis, unspecified: Secondary | ICD-10-CM | POA: Diagnosis present

## 2021-10-28 DIAGNOSIS — M25552 Pain in left hip: Secondary | ICD-10-CM | POA: Diagnosis present

## 2021-10-28 DIAGNOSIS — Z8249 Family history of ischemic heart disease and other diseases of the circulatory system: Secondary | ICD-10-CM | POA: Diagnosis not present

## 2021-10-28 DIAGNOSIS — L409 Psoriasis, unspecified: Secondary | ICD-10-CM | POA: Diagnosis present

## 2021-10-28 DIAGNOSIS — Z23 Encounter for immunization: Secondary | ICD-10-CM | POA: Diagnosis not present

## 2021-10-28 DIAGNOSIS — R011 Cardiac murmur, unspecified: Secondary | ICD-10-CM | POA: Diagnosis not present

## 2021-10-28 DIAGNOSIS — I2510 Atherosclerotic heart disease of native coronary artery without angina pectoris: Secondary | ICD-10-CM

## 2021-10-28 DIAGNOSIS — Z8679 Personal history of other diseases of the circulatory system: Secondary | ICD-10-CM | POA: Diagnosis not present

## 2021-10-28 DIAGNOSIS — E782 Mixed hyperlipidemia: Secondary | ICD-10-CM | POA: Diagnosis present

## 2021-10-28 DIAGNOSIS — Z8673 Personal history of transient ischemic attack (TIA), and cerebral infarction without residual deficits: Secondary | ICD-10-CM | POA: Diagnosis not present

## 2021-10-28 DIAGNOSIS — I440 Atrioventricular block, first degree: Secondary | ICD-10-CM | POA: Diagnosis not present

## 2021-10-28 DIAGNOSIS — S31102A Unspecified open wound of abdominal wall, epigastric region without penetration into peritoneal cavity, initial encounter: Secondary | ICD-10-CM | POA: Diagnosis not present

## 2021-10-28 DIAGNOSIS — I700 Atherosclerosis of aorta: Secondary | ICD-10-CM | POA: Diagnosis not present

## 2021-10-28 DIAGNOSIS — W5522XD Struck by cow, subsequent encounter: Secondary | ICD-10-CM

## 2021-10-28 DIAGNOSIS — R17 Unspecified jaundice: Secondary | ICD-10-CM | POA: Diagnosis present

## 2021-10-28 DIAGNOSIS — H409 Unspecified glaucoma: Secondary | ICD-10-CM | POA: Diagnosis not present

## 2021-10-28 DIAGNOSIS — R9431 Abnormal electrocardiogram [ECG] [EKG]: Secondary | ICD-10-CM | POA: Diagnosis not present

## 2021-10-28 DIAGNOSIS — S81801A Unspecified open wound, right lower leg, initial encounter: Secondary | ICD-10-CM | POA: Diagnosis not present

## 2021-10-28 LAB — BASIC METABOLIC PANEL
Anion gap: 11 (ref 5–15)
BUN: 12 mg/dL (ref 8–23)
CO2: 23 mmol/L (ref 22–32)
Calcium: 8.9 mg/dL (ref 8.9–10.3)
Chloride: 91 mmol/L — ABNORMAL LOW (ref 98–111)
Creatinine, Ser: 0.81 mg/dL (ref 0.61–1.24)
GFR, Estimated: >60 mL/min (ref >60)
Glucose, Bld: 125 mg/dL — ABNORMAL HIGH (ref 70–99)
Potassium: 4.2 mmol/L (ref 3.5–5.1)
Sodium: 125 mmol/L — ABNORMAL LOW (ref 135–145)

## 2021-10-28 LAB — CBC WITH DIFFERENTIAL/PLATELET
Abs Immature Granulocytes: 0.07 10*3/uL (ref 0.00–0.07)
Basophils Absolute: 0 10*3/uL (ref 0.0–0.1)
Basophils Relative: 0 %
Eosinophils Absolute: 0 10*3/uL (ref 0.0–0.5)
Eosinophils Relative: 0 %
HCT: 40.8 % (ref 39.0–52.0)
Hemoglobin: 14.6 g/dL (ref 13.0–17.0)
Immature Granulocytes: 1 %
Lymphocytes Relative: 4 %
Lymphs Abs: 0.4 10*3/uL — ABNORMAL LOW (ref 0.7–4.0)
MCH: 32.5 pg (ref 26.0–34.0)
MCHC: 35.8 g/dL (ref 30.0–36.0)
MCV: 90.9 fL (ref 80.0–100.0)
Monocytes Absolute: 0.8 10*3/uL (ref 0.1–1.0)
Monocytes Relative: 8 %
Neutro Abs: 8.9 10*3/uL — ABNORMAL HIGH (ref 1.7–7.7)
Neutrophils Relative %: 87 %
Platelets: 240 10*3/uL (ref 150–400)
RBC: 4.49 MIL/uL (ref 4.22–5.81)
RDW: 11.9 % (ref 11.5–15.5)
WBC: 10.3 10*3/uL (ref 4.0–10.5)
nRBC: 0 % (ref 0.0–0.2)

## 2021-10-28 LAB — COMPREHENSIVE METABOLIC PANEL
ALT: 25 U/L (ref 0–44)
AST: 29 U/L (ref 15–41)
Albumin: 4.1 g/dL (ref 3.5–5.0)
Alkaline Phosphatase: 78 U/L (ref 38–126)
Anion gap: 13 (ref 5–15)
BUN: 13 mg/dL (ref 8–23)
CO2: 25 mmol/L (ref 22–32)
Calcium: 9.4 mg/dL (ref 8.9–10.3)
Chloride: 88 mmol/L — ABNORMAL LOW (ref 98–111)
Creatinine, Ser: 0.76 mg/dL (ref 0.61–1.24)
GFR, Estimated: >60 mL/min (ref >60)
Glucose, Bld: 124 mg/dL — ABNORMAL HIGH (ref 70–99)
Potassium: 3.9 mmol/L (ref 3.5–5.1)
Sodium: 126 mmol/L — ABNORMAL LOW (ref 135–145)
Total Bilirubin: 1.3 mg/dL — ABNORMAL HIGH (ref 0.3–1.2)
Total Protein: 7.8 g/dL (ref 6.5–8.1)

## 2021-10-28 LAB — LACTIC ACID, PLASMA
Lactic Acid, Venous: 1.8 mmol/L (ref 0.5–1.9)
Lactic Acid, Venous: 1.5 mmol/L (ref 0.5–1.9)

## 2021-10-28 LAB — OSMOLALITY: Osmolality: 266 mOsm/kg — ABNORMAL LOW (ref 275–295)

## 2021-10-28 LAB — TROPONIN I (HIGH SENSITIVITY): Troponin I (High Sensitivity): 5 ng/L (ref <18)

## 2021-10-28 LAB — SODIUM, URINE, RANDOM: Sodium, Ur: 34 mmol/L

## 2021-10-28 LAB — OSMOLALITY, URINE: Osmolality, Ur: 485 mOsm/kg (ref 300–900)

## 2021-10-28 MED ORDER — ALBUTEROL SULFATE HFA 108 (90 BASE) MCG/ACT IN AERS
2.0000 | INHALATION_SPRAY | Freq: Four times a day (QID) | RESPIRATORY_TRACT | Status: DC | PRN
Start: 2021-10-28 — End: 2021-10-28

## 2021-10-28 MED ORDER — METOPROLOL TARTRATE 5 MG/5ML IV SOLN: 5.0000 mg | Freq: Four times a day (QID) | INTRAVENOUS | Status: DC | PRN

## 2021-10-28 MED ORDER — SODIUM CHLORIDE (PF) 0.9 % IJ SOLN
INTRAMUSCULAR | Status: AC
  Filled 2021-10-28: qty 50

## 2021-10-28 MED ORDER — METHOCARBAMOL 500 MG PO TABS
500.0000 mg | ORAL_TABLET | Freq: Three times a day (TID) | ORAL | Status: DC | PRN
Start: 2021-10-28 — End: 2021-10-31
  Administered 2021-10-28: 500 mg via ORAL
  Filled 2021-10-28: qty 1

## 2021-10-28 MED ORDER — MOMETASONE FURO-FORMOTEROL FUM 200-5 MCG/ACT IN AERO
2.0000 | INHALATION_SPRAY | Freq: Two times a day (BID) | RESPIRATORY_TRACT | Status: DC
  Administered 2021-10-28 – 2021-10-31 (×6): 2 via RESPIRATORY_TRACT
  Filled 2021-10-28: qty 8.8

## 2021-10-28 MED ORDER — SODIUM CHLORIDE 0.9 % IV SOLN: 250.0000 mL | INTRAVENOUS | Status: DC | PRN

## 2021-10-28 MED ORDER — METRONIDAZOLE 500 MG/100ML IV SOLN
500.0000 mg | Freq: Two times a day (BID) | INTRAVENOUS | Status: DC
  Administered 2021-10-28 – 2021-10-30 (×4): 500 mg via INTRAVENOUS
  Filled 2021-10-28 (×3): qty 100

## 2021-10-28 MED ORDER — TRAMADOL HCL 50 MG PO TABS
50.0000 mg | ORAL_TABLET | Freq: Four times a day (QID) | ORAL | Status: DC | PRN
  Administered 2021-10-29: 50 mg via ORAL
  Filled 2021-10-28: qty 1

## 2021-10-28 MED ORDER — AMLODIPINE BESYLATE 5 MG PO TABS
5.0000 mg | ORAL_TABLET | Freq: Every day | ORAL | Status: DC
  Administered 2021-10-29 – 2021-10-31 (×3): 5 mg via ORAL
  Filled 2021-10-28 (×3): qty 1

## 2021-10-28 MED ORDER — VANCOMYCIN HCL IN DEXTROSE 1-5 GM/200ML-% IV SOLN
1000.0000 mg | Freq: Once | INTRAVENOUS | Status: AC
  Administered 2021-10-28: 1000 mg via INTRAVENOUS
  Filled 2021-10-28: qty 200

## 2021-10-28 MED ORDER — ONDANSETRON HCL 4 MG/2ML IJ SOLN
4.0000 mg | Freq: Once | INTRAMUSCULAR | Status: AC
  Administered 2021-10-28: 4 mg via INTRAVENOUS
  Filled 2021-10-28: qty 2

## 2021-10-28 MED ORDER — IOHEXOL 300 MG/ML  SOLN
100.0000 mL | Freq: Once | INTRAMUSCULAR | Status: AC | PRN
  Administered 2021-10-28: 100 mL via INTRAVENOUS

## 2021-10-28 MED ORDER — LIDOCAINE 5 % EX PTCH
1.0000 | MEDICATED_PATCH | CUTANEOUS | Status: DC
  Administered 2021-10-28 – 2021-10-30 (×3): 1 via TRANSDERMAL
  Filled 2021-10-28 (×5): qty 1

## 2021-10-28 MED ORDER — ONDANSETRON HCL 4 MG PO TABS: 4.0000 mg | ORAL_TABLET | Freq: Four times a day (QID) | ORAL | Status: DC | PRN

## 2021-10-28 MED ORDER — SODIUM CHLORIDE 0.9% FLUSH: 3.0000 mL | INTRAVENOUS | Status: DC | PRN

## 2021-10-28 MED ORDER — ENOXAPARIN SODIUM 40 MG/0.4ML IJ SOSY
40.0000 mg | PREFILLED_SYRINGE | Freq: Every day | INTRAMUSCULAR | Status: DC
  Administered 2021-10-28 – 2021-10-30 (×3): 40 mg via SUBCUTANEOUS
  Filled 2021-10-28 (×3): qty 0.4

## 2021-10-28 MED ORDER — VANCOMYCIN HCL IN DEXTROSE 1-5 GM/200ML-% IV SOLN
1000.0000 mg | INTRAVENOUS | Status: DC
  Administered 2021-10-29: 1000 mg via INTRAVENOUS
  Filled 2021-10-28: qty 200

## 2021-10-28 MED ORDER — ONDANSETRON HCL 4 MG/2ML IJ SOLN
4.0000 mg | Freq: Four times a day (QID) | INTRAMUSCULAR | Status: DC | PRN
  Administered 2021-10-30: 4 mg via INTRAVENOUS
  Filled 2021-10-28 (×2): qty 2

## 2021-10-28 MED ORDER — SODIUM CHLORIDE 0.9% FLUSH
3.0000 mL | Freq: Two times a day (BID) | INTRAVENOUS | Status: DC
Start: 2021-10-28 — End: 2021-10-31
  Administered 2021-10-28 – 2021-10-30 (×5): 3 mL via INTRAVENOUS

## 2021-10-28 MED ORDER — ACETAMINOPHEN 500 MG PO TABS
1000.0000 mg | ORAL_TABLET | Freq: Four times a day (QID) | ORAL | Status: DC
  Administered 2021-10-28 – 2021-10-31 (×10): 1000 mg via ORAL
  Filled 2021-10-28 (×11): qty 2

## 2021-10-28 MED ORDER — ALBUTEROL SULFATE (2.5 MG/3ML) 0.083% IN NEBU
2.5000 mg | INHALATION_SOLUTION | Freq: Four times a day (QID) | RESPIRATORY_TRACT | Status: DC | PRN
  Administered 2021-10-29: 2.5 mg via RESPIRATORY_TRACT
  Filled 2021-10-28: qty 3

## 2021-10-28 MED ORDER — SODIUM CHLORIDE 0.9% FLUSH
3.0000 mL | Freq: Two times a day (BID) | INTRAVENOUS | Status: DC
  Administered 2021-10-29 – 2021-10-30 (×3): 3 mL via INTRAVENOUS

## 2021-10-28 MED ORDER — SODIUM CHLORIDE 0.9 % IV BOLUS
500.0000 mL | Freq: Once | INTRAVENOUS | Status: AC
  Administered 2021-10-28: 500 mL via INTRAVENOUS

## 2021-10-28 MED ORDER — MORPHINE SULFATE (PF) 2 MG/ML IV SOLN
1.0000 mg | INTRAVENOUS | Status: DC | PRN
  Administered 2021-10-28 – 2021-10-29 (×2): 2 mg via INTRAVENOUS
  Filled 2021-10-28 (×2): qty 1

## 2021-10-28 MED ORDER — LATANOPROST 0.005 % OP SOLN
1.0000 [drp] | Freq: Every day | OPHTHALMIC | Status: DC
  Administered 2021-10-29 – 2021-10-30 (×2): 1 [drp] via OPHTHALMIC
  Filled 2021-10-28: qty 2.5

## 2021-10-28 MED ORDER — MORPHINE SULFATE (PF) 4 MG/ML IV SOLN
4.0000 mg | Freq: Once | INTRAVENOUS | Status: AC
  Administered 2021-10-28: 4 mg via INTRAVENOUS
  Filled 2021-10-28: qty 1

## 2021-10-28 MED ORDER — TETANUS-DIPHTH-ACELL PERTUSSIS 5-2.5-18.5 LF-MCG/0.5 IM SUSY
0.5000 mL | PREFILLED_SYRINGE | Freq: Once | INTRAMUSCULAR | Status: AC
  Administered 2021-10-28: 0.5 mL via INTRAMUSCULAR
  Filled 2021-10-28: qty 0.5

## 2021-10-28 MED ORDER — SODIUM CHLORIDE 0.9 % IV SOLN
2.0000 g | Freq: Two times a day (BID) | INTRAVENOUS | Status: DC
  Administered 2021-10-28 – 2021-10-30 (×4): 2 g via INTRAVENOUS
  Filled 2021-10-28 (×4): qty 12.5

## 2021-10-28 MED ORDER — MORPHINE SULFATE (PF) 2 MG/ML IV SOLN
2.0000 mg | Freq: Once | INTRAVENOUS | Status: AC
  Administered 2021-10-28: 2 mg via INTRAVENOUS
  Filled 2021-10-28: qty 1

## 2021-10-28 NOTE — ED Provider Notes (Signed)
[facility] DEPT Provider Note   CSN: [id number] Arrival date & time: 10/28/21  1035     History  Chief Complaint  Patient presents with   Leg Injury    [name] is a 88 y.o. male with a history of hypertension, hyperlipidemia, asthma, rectal cancer (2017).  Presents to the emergency department with a chief complaint of right-sided rib pain and right leg wound.  Patient reports that 6 days prior he was knocked to the ground by 1000 pound cow.  Patient reports he was then stepped on by said.  Patient is unsure if he lost any consciousness but states if he did it was only for "a few seconds."  Patient reports that he had wound to his right lower extremity which she cleaned with soap and water immediately after the incident.  Patient reports that he went to his primary care provider yesterday and received ceftriaxone in the office.  Patient was discharged on doxycycline.  Patient returned to the office today for wound recheck and sent to the emergency department for further evaluation due to concern of worsening cellulitis.  Patient reports that his swelling and erythema to right lower extremity has been getting progressively worse.  Patient reports that he has seen purulent discharge coming from the wound.  Patient also reports that he started having right rib pain today.  Patient denies having any pain previous to today.  Pain is worse with movement and deep breathing.  Denies any fever, chills, numbness, weakness, saddle anesthesia, neck pain, back pain, visual disturbance, headache, bowel/bladder dysfunction.     HPI     Home Medications Prior to Admission medications   Medication Sig Start Date End Date Taking? Authorizing Provider  albuterol (PROAIR HFA) 108 (90 Base) MCG/ACT inhaler 2 puffs every 4 hours as needed only  if your can't catch your breath 03/16/21   Tanda Rockers, MD  amLODipine (NORVASC) 5 MG tablet TAKE 1 TABLET (5 MG TOTAL) BY  MOUTH DAILY. 08/09/21   Abonza, Maritza, PA-C  betamethasone dipropionate 0.05 % cream APPLY EVERY 12 HOURS TO AFFECTED AREA (RF per DERM) 07/19/19   Opalski, Neoma Laming, DO  budesonide-formoterol (SYMBICORT) 160-4.5 MCG/ACT inhaler Take 2 puffs first thing in am and then another 2 puffs about 12 hours later. 02/28/20   Tanda Rockers, MD  doxycycline (VIBRA-TABS) 100 MG tablet Take 1 tablet (100 mg total) by mouth 2 (two) times daily for 14 days. 10/27/21 11/10/21  Lorrene Reid, PA-C  ketorolac (ACULAR) 0.5 % ophthalmic solution Place 1 drop into the left eye 4 (four) times daily. 03/30/21   [provider]  latanoprost (XALATAN) 0.005 % ophthalmic solution Place 1 drop into both eyes at bedtime.    [provider]  moxifloxacin (VIGAMOX) 0.5 % ophthalmic solution Place 1 drop into the left eye 4 (four) times daily. 03/30/21   [provider]  mupirocin ointment (BACTROBAN) 2 % Apply 1 Application topically 2 (two) times daily. 10/27/21   Lorrene Reid, PA-C  prednisoLONE acetate (PRED FORTE) 1 % ophthalmic suspension Place 1 drop into the left eye 4 (four) times daily. 03/30/21   [provider]  valsartan-hydrochlorothiazide (DIOVAN-HCT) 320-12.5 MG tablet Take 1 tablet by mouth daily. 08/24/21   Lorrene Reid, PA-C      Allergies    Patient has no known allergies.    Review of Systems   Review of Systems  Constitutional:  Negative for chills and fever.  Eyes:  Negative for visual  disturbance.  Respiratory:  Negative for shortness of breath.   Cardiovascular:  Positive for chest pain and leg swelling.  Gastrointestinal:  Negative for abdominal pain, nausea and vomiting.  Genitourinary:  Negative for difficulty urinating and dysuria.  Musculoskeletal:  Negative for back pain and neck pain.  Skin:  Positive for color change, rash and wound. Negative for pallor.  Neurological:  Negative for dizziness, tremors, seizures, syncope, facial asymmetry, speech  difficulty, weakness, light-headedness, numbness and headaches.  Psychiatric/Behavioral:  Negative for confusion.     Physical Exam Updated Vital Signs BP (!) 163/88 (BP Location: Left Arm)   Pulse 89   Temp 97.7 F (36.5 C) (Oral)   Resp 18   Ht '5\' 8"'$  (1.727 m)   Wt 64.4 kg   SpO2 96%   BMI 21.59 kg/m  Physical Exam Vitals and nursing note reviewed.  Constitutional:      General: He is not in acute distress.    Appearance: He is not ill-appearing, toxic-appearing or diaphoretic.  HENT:     Head: Normocephalic and atraumatic. No raccoon eyes, Battle's sign, abrasion, contusion, masses, right periorbital erythema, left periorbital erythema or laceration.  Eyes:     General: No scleral icterus.       Right eye: No discharge.        Left eye: No discharge.  Cardiovascular:     Rate and Rhythm: Normal rate.     Pulses:          Radial pulses are 2+ on the right side and 2+ on the left side.       Dorsalis pedis pulses are 2+ on the right side.  Pulmonary:     Effort: Pulmonary effort is normal.  Chest:     Chest wall: No mass, lacerations, deformity, swelling, tenderness, crepitus or edema.     Comments: No ecchymosis to chest wall Abdominal:     General: Abdomen is flat. There is no distension. There are no signs of injury.     Palpations: Abdomen is soft. There is no mass or pulsatile mass.     Tenderness: There is no abdominal tenderness. There is no guarding or rebound.     Hernia: A hernia is present. Hernia is present in the ventral area.     Comments: Easily reducible ventral hernia.  No ecchymosis to abdomen  Musculoskeletal:     Right knee: No swelling, deformity, effusion, erythema, ecchymosis, lacerations, bony tenderness or crepitus. Normal range of motion. No tenderness. Normal alignment.     Left knee: No swelling, deformity, effusion, erythema, ecchymosis, lacerations, bony tenderness or crepitus. Normal range of motion. No tenderness. Normal alignment.      Right lower leg: Swelling, laceration and tenderness present. No deformity or bony tenderness. Edema present.     Right ankle: No swelling, deformity, ecchymosis or lacerations. No tenderness. Normal range of motion.     Right foot: Normal range of motion and normal capillary refill. Swelling present. No deformity, laceration, tenderness, bony tenderness or crepitus. Normal pulse.     Comments: No midline tenderness deformity to cervical, thoracic, lumbar spine.  Patient has no tenderness, bony tenderness, or deformity to bilateral upper or left lower extremity.  Patient has full range of motion to all limbs.  Healing wounds to bilateral elbows with no surrounding erythema, warmth, or swelling.  Full range of motion to bilateral elbows.  +2 swelling to right lower extremity with large wound.  Serosanguineous discharge noted to be draining from wounds.  Erythema  and warmth surrounding wounds.  Skin:    General: Skin is warm and dry.  Neurological:     General: No focal deficit present.     Mental Status: He is alert.  Psychiatric:        Behavior: Behavior is cooperative.       ED Results / Procedures / Treatments   Labs (all labs ordered are listed, but only abnormal results are displayed) Labs Reviewed  COMPREHENSIVE METABOLIC PANEL - Abnormal; Notable for the following components:      Result Value   Sodium 126 (*)    Chloride 88 (*)    Glucose, Bld 124 (*)    Total Bilirubin 1.3 (*)    All other components within normal limits  CBC WITH DIFFERENTIAL/PLATELET - Abnormal; Notable for the following components:   Neutro Abs 8.9 (*)    Lymphs Abs 0.4 (*)    All other components within normal limits  CULTURE, BLOOD (ROUTINE X 2)  CULTURE, BLOOD (ROUTINE X 2)  LACTIC ACID, PLASMA  LACTIC ACID, PLASMA  TROPONIN I (HIGH SENSITIVITY)  TROPONIN I (HIGH SENSITIVITY)    EKG EKG Interpretation  Date/Time:  Thursday October 28 2021 11:56:43 EDT Ventricular Rate:  80 PR  Interval:  298 QRS Duration: 137 QT Interval:  431 QTC Calculation: 498 R Axis:   -63 Text Interpretation: Sinus or ectopic atrial rhythm Prolonged PR interval RBBB and LAFB new bifasicular block Confirmed by Ezequiel Essex [id number]) on 10/28/2021 12:00:34 PM  Radiology CT CHEST ABDOMEN PELVIS W CONTRAST  Result Date: 10/28/2021 CLINICAL DATA:  Trampled by cow 6 days ago, right rib pain and open wounds. EXAM: CT CHEST, ABDOMEN, AND PELVIS WITH CONTRAST TECHNIQUE: Multidetector CT imaging of the chest, abdomen and pelvis was performed following the standard protocol during bolus administration of intravenous contrast. RADIATION DOSE REDUCTION: This exam was performed according to the departmental dose-optimization program which includes automated exposure control, adjustment of the mA and/or kV according to patient size and/or use of iterative reconstruction technique. CONTRAST:  128m OMNIPAQUE IOHEXOL 300 MG/ML  SOLN COMPARISON:  Chest radiograph 08/10/2017 FINDINGS: CT CHEST FINDINGS Cardiovascular: Coronary, aortic arch, and branch vessel atherosclerotic vascular disease. Borderline cardiomegaly. Mediastinum/Nodes: Large type 3 hiatal hernia. Lungs/Pleura: No pneumothorax or pulmonary contusion. Mild atelectasis or scarring in the lingula. Musculoskeletal: Nondisplaced acute fractures the right anterolateral third and fourth ribs noted with displaced acute anterolateral right fifth and sixth rib fractures. Probable nondisplaced right seventh rib fracture laterally. Possibly old right lateral eighth rib fracture. Old left posterior tenth and eleventh rib fractures noted. CT ABDOMEN PELVIS FINDINGS Hepatobiliary: Unremarkable Pancreas: Unremarkable Spleen: Unremarkable Adrenals/Urinary Tract: Both adrenal glands appear normal. Benign cysts of the right kidney noted including a right kidney upper pole cyst with faint marginal in septal calcification. These lesions do not require any further workup.  Stomach/Bowel: Large type 3 hiatal hernia. Colonic diverticulosis notably in the ascending colon. No appendiceal inflammation but there are small diverticula of the appendix noted. Anastomotic staple line in the rectum with presacral soft tissue density which is probably related to therapy from prior regional tumor, correlate with patient history. Vascular/Lymphatic: Atherosclerosis is present, including aortoiliac atherosclerotic disease. No pathologic adenopathy. Reproductive: Unremarkable Other: No supplemental non-categorized findings. Musculoskeletal: Mild lumbar spondylosis and degenerative disc disease. A small ventral midline Richter hernia contains a margin of the transverse colon on image 122 series 5. Just below this is a separate small hernia containing adipose tissue. IMPRESSION: 1. Acute fractures of the right third, fourth,  fifth, sixth, and seventh ribs. The fifth and sixth rib fractures are mildly displaced. No pneumothorax or pulmonary contusion. No other acute traumatic findings. 2. Old right lateral eighth rib fracture and old left posterior tenth and eleventh rib fractures. 3. There is a small ventral midline Richter hernia containing a margin of the transverse colon without findings of strangulation at this time. 4. Other imaging findings of potential clinical significance: Aortic Atherosclerosis (ICD10-I70.0). Coronary atherosclerosis. Borderline cardiomegaly. Large type 3 hiatal hernia. Colonic diverticulosis including diverticula of the appendix. Electronically Signed   By: Van Clines M.D.   On: 10/28/2021 14:14   CT HEAD WO CONTRAST (5MM)  Result Date: 10/28/2021 CLINICAL DATA:  Trampled by cow 6 days ago. Open wounds. Right rib pain. EXAM: CT HEAD WITHOUT CONTRAST TECHNIQUE: Contiguous axial images were obtained from the base of the skull through the vertex without intravenous contrast. RADIATION DOSE REDUCTION: This exam was performed according to the departmental  dose-optimization program which includes automated exposure control, adjustment of the mA and/or kV according to patient size and/or use of iterative reconstruction technique. COMPARISON:  Report from 10/11/1998 FINDINGS: Brain: The brainstem, cerebellum, cerebral peduncles, thalami, basal ganglia, basilar cisterns, and ventricular system appear within normal limits. Periventricular white matter and corona radiata hypodensities favor chronic ischemic microvascular white matter disease. Age-appropriate cerebral atrophy. No intracranial hemorrhage, mass lesion, or acute CVA. Vascular: There is atherosclerotic calcification of the cavernous carotid arteries bilaterally. Skull: Unremarkable Sinuses/Orbits: Mild chronic ethmoid sinusitis. Other: No supplemental non-categorized findings. IMPRESSION: 1. No acute intracranial findings. 2. Periventricular white matter and corona radiata hypodensities favor chronic ischemic microvascular white matter disease. 3. Mild chronic ethmoid sinusitis. 4. Atherosclerosis. Electronically Signed   By: Van Clines M.D.   On: 10/28/2021 13:42   DG Tibia/Fibula Right  Result Date: 10/28/2021 CLINICAL DATA:  Wound EXAM: RIGHT TIBIA AND FIBULA - 2 VIEW COMPARISON:  None Available. FINDINGS: Alignment is anatomic. No acute fracture. No cortical erosion or periosteal reaction. No soft tissue air. IMPRESSION: Negative. Electronically Signed   By: Macy Mis M.D.   On: 10/28/2021 12:12    Procedures .Critical Care  Performed by: Loni Beckwith, PA-C Authorized by: Loni Beckwith, PA-C   Critical care provider statement:    Critical care time (minutes):  30   Critical care was necessary to treat or prevent imminent or life-threatening deterioration of the following conditions:  Trauma   Critical care was time spent personally by me on the following activities:  Development of treatment plan with patient or surrogate, discussions with consultants, evaluation of  patient's response to treatment, examination of patient, ordering and review of laboratory studies, ordering and review of radiographic studies, ordering and performing treatments and interventions, pulse oximetry, re-evaluation of patient's condition, review of old charts and obtaining history from patient or surrogate   Care discussed with: admitting provider       Medications Ordered in ED Medications - No data to display  ED Course/ Medical Decision Making/ A&P Clinical Course as of 10/28/21 1519  Thu Oct 28, 2021  1513 I spoke to hospitalist Dr. Bonner Puna who will see the patient for admission. [PB]    Clinical Course User Index [PB] Loni Beckwith, PA-C                           Medical Decision Making Amount and/or Complexity of Data Reviewed Labs: ordered. Radiology: ordered. ECG/medicine tests: ordered.  Risk Prescription drug management.  Decision regarding hospitalization.   Alert 88 year old male in no acute distress, nontoxic-appearing.  Presents the emergency department for complaint of right leg wound and right-sided chest pain after being struck by a cow.  He occurred 6 days prior.  Information was obtained from patient and patient's sister-in-law at bedside.  I reviewed patient's past medical records including notes from outpatient providers, previous provider notes, labs, and imaging.  Patient has medical history as outlined in HPI which complicates his care.  Per chart review patient was seen at his primary care doctor's office yesterday with concern for cellulitis of right lower extremity.  Patient received ceftriaxone and was prescribed doxycycline.  Patient returns today for wound recheck and sent to the emergency department due to concern for worsening cellulitis.  Patient has swelling and erythema to right lower extremity surrounding wound.  Concern for cellulitis.  Will obtain lactic acid, blood culture, and CBC.  We will start patient on vancomycin.  With  reports of right rib pain after traumatic injury concern for rib fractures.  Will obtain CT chest, abdomen, and pelvis for further evaluation due to high mechanism of injury.  Patient reports possible syncopal episode will obtain noncontrast head CT.  Patient given morphine for pain management.  I personally viewed and interpreted patient's EKG.  Tracing shows sinus rhythm with  RBBB and LAFB.  I personally viewed and interpreted patient's lab results.  Pertinent findings include: -Sodium 126 -No leukocytosis or anemia -Lactic acid within normal limits -Troponin 5  I personally viewed and interpreted patient's x-ray imaging.  Agree with radiology interpretation of no acute osseous abnormality to right tib-fib.  I personally viewed interpret patient CT imaging.  Agree with radiology interpretation of no acute intracranial abnormality.  Right third through seventh rib fractures.  Small ventral midline rector hernia.  Patient reports improvement in pain after receiving morphine.  Due to hyponatremia will give patient sodium and chloride fluid bolus.  We will consult general surgery due to multiple rib fractures.  Patient will need admission for further antibiotics due to concern for worsening cellulitis.  Patient was discussed with and evaluated by Dr. Wyvonnia Dusky.  I spoke to Dr. Bonner Puna who will see the patient for admission.  I spoke to general surgery PA Saverio Danker who advised they will reach out to trauma surgery and was gone to see if the patient is to be transferred there.  Will refer recommendations to hospitalist.        Final Clinical Impression(s) / ED Diagnoses Final diagnoses:  Closed fracture of multiple ribs of right side, initial encounter  Cellulitis of right lower extremity    Rx / DC Orders ED Discharge Orders     None         Loni Beckwith, PA-C 10/28/21 1534    Ezequiel Essex, MD 10/28/21 1557

## 2021-10-28 NOTE — ED Triage Notes (Signed)
Patient states he was trampled by a cow 6 days ago. Patient has redness, swelling to the right lower leg. Patient also has open wounds.  Patient states he also has right rib cage area pain where the cow trampled him.  Patient was seen at an UC today and was sent to the ED for further evaluation.

## 2021-10-28 NOTE — Plan of Care (Signed)

## 2021-10-28 NOTE — H&P (Signed)
History and Physical    Patient: [name] HEN:[id number] DOB: 1930-06-30 DOA: 10/28/2021 DOS: the patient was seen and examined on 10/28/2021 PCP: Lorrene Reid, PA-C  Patient coming from: Home by way of PCP office  Chief Complaint:  Chief Complaint  Patient presents with   Leg Injury   HPI: [name] is an 88 y.o. male with a history of HTN, HLD, asthma, rectal CA s/p resection remotely, glaucoma who presented to the ED on the advice of his PCP for worsening pain related to right leg wound. Six days ago he was trampled by a cow resulting in this wound that he cleaned with soap and water and ultimately presented to his PCP for yesterday. Expedited wound care follow up was arranged, ceftriaxone 1g given IM and doxycycline started. He returned for follow up today where the wound looked worse and he was sent over. He denies fever, but noted there's been some discharge and worsening swelling of the right leg and extending redness. In the ED he began complaining of right chest pain worse with deep inspiration but was in no respiratory distress without hypoxia. CT chest/abd/pelvis demonstrated new right 3rd-7th rib fractures. Sodium noted to be 126.   He was hemodynamically stable without evidence of sepsis, though cellulitis was severe and had failed outpatient management, so was given vancomycin and normal saline bolus after blood cultures drawn. Hospitalists and general surgery consulted, determined medicine admission with surgical consult appropriate and will be treated for infected wound, cellulitis, rib fractures, and hyponatremia.  Review of Systems: As mentioned in the history of present illness. All other systems reviewed and are negative. Past Medical History:  Diagnosis Date   Asthma    Depression    wife died 2 months ago. 05-06-11   Eyelid cancer    Hyperlipidemia, mixed 05/06/2015   Hypertension    Rectal cancer ([place]) 1999   Past Surgical History:  Procedure  Laterality Date   COLON RESECTION  1999   cyst eyelid  2012   HAND TUMOR EXCISION  2010   right   Social History:  reports that he has never smoked. He has never used smokeless tobacco. He reports current alcohol use of about 16.0 standard drinks of alcohol per week. He reports that he does not use drugs.  No Known Allergies  Family History  Problem Relation Age of Onset   Hypertension Father    Stroke Father    Suicidality Brother    Colon cancer Neg Hx    Rectal cancer Neg Hx     Prior to Admission medications   Medication Sig Authorizing Provider  albuterol (PROAIR HFA) 108 (90 Base) MCG/ACT inhaler 2 puffs every 4 hours as needed only  if your can't catch your breath Tanda Rockers, MD  amLODipine (NORVASC) 5 MG tablet TAKE 1 TABLET (5 MG TOTAL) BY MOUTH DAILY. Abonza, Maritza, PA-C  betamethasone dipropionate 0.05 % cream APPLY EVERY 12 HOURS TO AFFECTED AREA (RF per DERM) Opalski, Deborah, DO  budesonide-formoterol (SYMBICORT) 160-4.5 MCG/ACT inhaler Take 2 puffs first thing in am and then another 2 puffs about 12 hours later. Tanda Rockers, MD  doxycycline (VIBRA-TABS) 100 MG tablet Take 1 tablet (100 mg total) by mouth 2 (two) times daily for 14 days. Lorrene Reid, PA-C  ketorolac (ACULAR) 0.5 % ophthalmic solution Place 1 drop into the left eye 4 (four) times daily. [provider]  latanoprost (XALATAN) 0.005 % ophthalmic solution Place 1 drop into both eyes at  bedtime. [provider]  moxifloxacin (VIGAMOX) 0.5 % ophthalmic solution Place 1 drop into the left eye 4 (four) times daily. [provider]  mupirocin ointment (BACTROBAN) 2 % Apply 1 Application topically 2 (two) times daily. Lorrene Reid, PA-C  prednisoLONE acetate (PRED FORTE) 1 % ophthalmic suspension Place 1 drop into the left eye 4 (four) times daily. [provider]  valsartan-hydrochlorothiazide (DIOVAN-HCT) 320-12.5 MG tablet Take 1 tablet by mouth daily.  Lorrene Reid, PA-C   Physical Exam: BP (!) 151/91   Pulse 65   Temp 97.7 F (36.5 C) (Oral)   Resp 17   Ht '5\' 8"'$  (1.727 m)   Wt 64.4 kg   SpO2 96%   BMI 21.59 kg/m   Gen: 88 y.o. male in no distress Pulm: Nonlabored breathing room air. Clear, slightly diminished but all lung fields with air exchange. No crackles or wheezes. CV: Regular with occasional dropped beats, partially irregular, I/VI systolic and diastolic murmur at the base/RUSB. No rub, or gallop. No JVD, no LLE dependent edema. GI: Abdomen soft, non-tender, non-distended, with normoactive bowel sounds. Reducible umbilical hernia.  Ext: RLE is edematous with large wound with slough and surrounding tender erythema, mild induration, no fluctuance. +Inguinal lymphadenopathy though no streaking erythema proximally. Reported tenderness along lateral hips/thighs with BL leg flexion, though has good full AROM, no visible or palpable deformities. Skin: Diffuse psoriasiform eruption nearly everywhere without infection. Right dorsal forearm with eschar without tenderness or erythema. Neuro: Alert and oriented. No focal neurological deficits. Psych: Judgement and insight appear fair. Mood euthymic & affect congruent. Behavior is appropriate.    Data Reviewed: ECG: NSR, 1st deg AVB, RBBB, LAFB. P waves monomorphic when accounting for artifact Na 126, Cr 0.76 TBili 1.3 WBC 10.3k (8.9k PMN, 400 lymph) Hgb 14.6g/dl Plt 240  Troponin 5 Lactic acid 1.8  CT CHEST ABDOMEN PELVIS W CONTRAST 10/28/2021 1. Acute fractures of the right third, fourth, fifth, sixth, and seventh ribs. The fifth and sixth rib fractures are mildly displaced. No pneumothorax or pulmonary contusion. No other acute traumatic findings. 2. Old right lateral eighth rib fracture and old left posterior tenth and eleventh rib fractures. 3. There is a small ventral midline Richter hernia containing a margin of the transverse colon without findings of strangulation at this  time. 4. Other imaging findings of potential clinical significance: Aortic Atherosclerosis (ICD10-I70.0). Coronary atherosclerosis. Borderline cardiomegaly. Large type 3 hiatal hernia. Colonic diverticulosis including diverticula of the appendix.    CT HEAD WO CONTRAST  10/28/2021  1. No acute intracranial findings. 2. Periventricular white matter and corona radiata hypodensities favor chronic ischemic microvascular white matter disease. 3. Mild chronic ethmoid sinusitis. 4. Atherosclerosis.   DG Tibia/Fibula Right  Negative.    Assessment and Plan: Struck by cow, subsequent encounter  Right lower leg infected wound: Mechanism, despite cleansing with soap and water, suggests polymicrobial etiology. +purulence reported. No air or osteomyelitis by XR. No fluctuance on exam. Not septic.  - Received Tdap. - Continue vancomycin, add cefepime, flagyl for now. Check blood cultures. - Note wound culture taken at PCP's office 6/28, of unclear utility if just surface sampling. - Given atherosclerosis radiographically, will check ABIs to confirm sufficient arterial supply, though has good pulses - Has HbA1c in system of 5.3%. - Appreciate surgery recommendations, no need currently for cross sectional imaging or debridement at this time. Will cleanse wound and consult WOC for wound care recommendations/orders.  - Pt has wound care clinic follow up arranged for 7/3, would  need to reschedule this if he remains inpatient at that time.  Irregular heart rhythm: Sinus vs. ectopic atrial rhythm on admission ECG with bifascicular block (no priors for comparison). No known hx dysrhythmia.  - Check TSH - Change med-surg order > telemetry for monitoring.  - Check echocardiogram, has murmur at the base.  Hyponatremia: Unclear chronicity. Appears euvolemic at this time and asymptomatic.  - 500cc NS bolus given, will recheck BMP to inform further treatment options.  - Check Osm, urine Na/Osm as well.  - Regular  diet, 1500cc fluid restriction  Right 3rd-7th rib fractures: Surgery consulted. Fortunately no PTX on CT.  - Encourage pulmonary toilet facilitated by adequate pain control as ordered: Tylenol, lidoderm patch, antispasmodic, tramadol, morphine.  Asthma: Quiescent, followed by Dr. Melvyn Novas.  - Continue symbicort and prn albuterol  HTN:  - Took medications today. Continue norvasc tmrw, hold diovan w/hyponatremia.  History of rectal CA s/p excision in 1999: Most recent CEA was normal, following with Dr. Marin Olp for surveillance, NED.   Coronary and aortic atherosclerosis: Troponin negative, no anginal limitations to active lifestyle or symptoms of heart failure.  - Suggest outpatient cardiology evaluation if within patient's goals of care to pursue work up.  - Going forward, may benefit from aspirin, statin, beta blocker.   Cerebrovascular disease: CT head showed periventricular white matter and corona radiata hypodensities favor chronic ischemic microvascular white matter disease. No acute stroke. - Again, could consider antiplatelet, statin. Will check lipid panel in AM.  History of HLD: Not on statin it appears  Bilateral thigh/hip pain: Suspected soft tissue injuries sequelae with reassuring exam.  - Will radiograph areas to confirm no fragility fractures.  Hyperbilirubinemia: Mild, unclear etiology/significance at this time.  - Recheck in AM  Psoriasis: Chronic, stable, not chronically on treatment.  Glaucoma:  - Continue gtt's  Alcohol use: History of moderate consumption, none recently and no hx withdrawal. LFTs ok. Will monitor for withdrawal   Advance Care Planning: Full code confirmed with patient  Consults: Surgery  Family Communication: SIL at bedside  Severity of Illness: The appropriate patient status for this patient is INPATIENT. Inpatient status is judged to be reasonable and necessary in order to provide the required intensity of service to ensure the patient's  safety. The patient's presenting symptoms, physical exam findings, and initial radiographic and laboratory data in the context of their chronic comorbidities is felt to place them at high risk for further clinical deterioration. Furthermore, it is not anticipated that the patient will be medically stable for discharge from the hospital within 2 midnights of admission.   * I certify that at the point of admission it is my clinical judgment that the patient will require inpatient hospital care spanning beyond 2 midnights from the point of admission due to high intensity of service, high risk for further deterioration and high frequency of surveillance required.*  Author: Patrecia Pour, MD 10/28/2021 5:14 PM  For on call review [email or web address].

## 2021-10-28 NOTE — Progress Notes (Signed)
Pharmacy Antibiotic Note  [name] is a 88 y.o. male admitted on 10/28/2021 with  wound infection .  Pharmacy has been consulted for Cefepime and vancomycin  dosing.  Active Problem(s): RLE wound after knocked down/trampled by a cow 6 days ago. Received abx from PCP 6/28. Now with wound drainage -  multiple right-sided rib fractures.  PMH: hypertension, hyperlipidemia, asthma, rectal cancer (2017).   ID: Wound infection.  - Afebrile WBC 10.3, Scr <1  Cefepime 6/29>> Flagyl 6/29>> Vancomycin 6/29 >>   Plan: Vancomycin 1000 mg IV q24h ( Scr rounded to 1, AUC 501)  Cefepime 2g IV q12 hrs Flagyl '500mg'$  IV q12 hrs    Height: '5\' 8"'$  (172.7 cm) Weight: 64.4 kg (142 lb) IBW/kg (Calculated) : 68.4  Temp (24hrs), Avg:97.7 F (36.5 C), Min:97.7 F (36.5 C), Max:97.7 F (36.5 C)  Recent Labs  Lab 10/28/21 1219  WBC 10.3  CREATININE 0.76  LATICACIDVEN 1.8     Estimated Creatinine Clearance: 58.1 mL/min (by C-G formula based on SCr of 0.76 mg/dL).    No Known Allergies  Royetta Asal, PharmD, BCPS 10/28/2021 5:24 PM

## 2021-10-28 NOTE — Progress Notes (Signed)
Pharmacy Antibiotic Note  [name] is a 88 y.o. male admitted on 10/28/2021 with  wound infection .  Pharmacy has been consulted for Cefepime dosing.  Active Problem(s): RLE wound after knocked down/trampled by a cow 6 days ago. Received abx from PCP 6/28. Now with wound drainage -  multiple right-sided rib fractures.  PMH: hypertension, hyperlipidemia, asthma, rectal cancer (2017).   ID: Wound infection.  - Afebrile WBC 10.3, Scr <1  Cefepime 6/29>> Flagyl 6/29>>  Plan: Cefepime 2g IV q12 hrs Flagyl '500mg'$  IV q12 hrs    Height: '5\' 8"'$  (172.7 cm) Weight: 64.4 kg (142 lb) IBW/kg (Calculated) : 68.4  Temp (24hrs), Avg:97.7 F (36.5 C), Min:97.7 F (36.5 C), Max:97.7 F (36.5 C)  Recent Labs  Lab 10/28/21 1219  WBC 10.3  CREATININE 0.76  LATICACIDVEN 1.8    Estimated Creatinine Clearance: 58.1 mL/min (by C-G formula based on SCr of 0.76 mg/dL).    No Known Allergies  [name], PharmD, BCPS Clinical Staff Pharmacist [email or web address]   [name] 10/28/2021 4:13 PM

## 2021-10-28 NOTE — Consult Note (Signed)
[name] [birth year]  [id number].    Requesting MD: Dr. Vance Gather Chief Complaint/Reason for Consult: Rib fx's  HPI: [name] is a 88 y.o. male with a hx of HTN, HLD, asthma and remote hx of rectal CA and eyelid CA who presented to the ED for right rib pain and right leg pain.  Patient was reportedly hit/knocked down and then trampled by 1000 pound cow approximately 6 days ago.  No head trauma or LOC.  He has a right lower leg wound and the resultant of the event that he cleaned with soap and water after the event.  Initially he really didn't have much pain.  It wasn't until yesterday that he started having right rib/chest pain.  He took tylenol for this but hasn't seemed to touch it recently.  He also complains of pain in both of his outer thighs/"hips" since the accident as well. He has been able to ambulate since the event.  Began developing redness around the RLE wound for which she was seen by his PCP yesterday and received an office IM Rocephin and a prescription for doxycycline.  He reports RLE pain + redness did not improve.Marland Kitchen  He was seen again by his PCP today and was sent to the ED for concerns of worsening cellulitis.  He is not on any blood thinners. NKDA's. He denies any SOB, h/o irregular heart beat, chills, fevers, dysuria, etc.  He complains of a small amount of abdominal pain from where he was hit.  Admits to diffuse psoriasis.  ROS: As above, see HPI  Family History  Problem Relation Age of Onset   Hypertension Father    Stroke Father    Suicidality Brother    Colon cancer Neg Hx    Rectal cancer Neg Hx     Past Medical History:  Diagnosis Date   Asthma    Depression    wife died 2 months ago. 05-15-2011   Eyelid cancer    Hyperlipidemia, mixed 05/06/2015   Hypertension    Rectal cancer ([place]) 1999    Past Surgical History:  Procedure Laterality Date   COLON RESECTION  1999   cyst eyelid  2012   HAND TUMOR EXCISION  2010   right    Social  History:  reports that he has never smoked. He has never used smokeless tobacco. He reports current alcohol use of about 16.0 standard drinks of alcohol per week. He reports that he does not use drugs.  Allergies: No Known Allergies  (Not in a hospital admission)    Physical Exam: Blood pressure (!) 142/73, pulse 76, temperature 97.7 F (36.5 C), temperature source Oral, resp. rate 13, height '5\' 8"'$  (1.727 m), weight 64.4 kg, SpO2 98 %. General: pleasant, WD/WN male who is laying in bed in NAD HEENT: head is normocephalic, atraumatic.  Sclera are noninjected.  PERRL.  Lower right eyelid is chronically abnormal from history of shingles.  Ears and nose without any masses or lesions.  Mouth is pink and moist. Dentition fair Neck: C-spine already cleared. No c-spine ttp or step offs. Able rom without pain.  Heart: irregular and brady.  Normal s1,s2. No obvious murmurs, gallops, or rubs noted.  Palpable pedal pulses bilaterally  Lungs: CTAB, no wheezes, rhonchi, or rales noted.  Respiratory effort nonlabored.  Chest wall tenderness on right side as expected.  No bruising or open wounds noted. Abd:  Soft, NT/ND, +BS, no masses or organomegaly.  2 small incisional  hernias noted.  One supraumbilical and one umbilical.  Soft and reducible. MSK: Able rom of the LUE/RUE/LLE without pain or gross abnormality. No ttp. Open wound on the medial aspect of the RLE below the level of the knee with some edema and erythema as noted in the picture below.  No purulent drainage.  No gross necrotic tissue.  This is warm to the touch.  He has normal ROM of this entire extremity.  He does have pain along his greater trochanteric bursa bilaterally with hip flexion.  Skin: As above, otherwise warm and dry with no masses, lesions, or rashes Psych: A&Ox4 with an appropriate affect Neuro: cranial nerves grossly intact, equal strength in BUE/BLE bilaterally, normal speech, thought process intact, moves all extremities, gait not  assessed      Results for orders placed or performed during the hospital encounter of 10/28/21 (from the past 48 hour(s))  Lactic acid, plasma     Status: None   Collection Time: 10/28/21 12:19 PM  Result Value Ref Range   Lactic Acid, Venous 1.8 0.5 - 1.9 mmol/L    Comment: Performed at [facility], [place] [address] [place], [place] [address]  Comprehensive metabolic panel     Status: Abnormal   Collection Time: 10/28/21 12:19 PM  Result Value Ref Range   Sodium 126 (L) 135 - 145 mmol/L   Potassium 3.9 3.5 - 5.1 mmol/L   Chloride 88 (L) 98 - 111 mmol/L   CO2 25 22 - 32 mmol/L   Glucose, Bld 124 (H) 70 - 99 mg/dL    Comment: Glucose reference range applies only to samples taken after fasting for at least 8 hours.   BUN 13 8 - 23 mg/dL   Creatinine, Ser 0.76 0.61 - 1.24 mg/dL   Calcium 9.4 8.9 - 10.3 mg/dL   Total Protein 7.8 6.5 - 8.1 g/dL   Albumin 4.1 3.5 - 5.0 g/dL   AST 29 15 - 41 U/L   ALT 25 0 - 44 U/L   Alkaline Phosphatase 78 38 - 126 U/L   Total Bilirubin 1.3 (H) 0.3 - 1.2 mg/dL   GFR, Estimated >60 >60 mL/min    Comment: (NOTE) Calculated using the CKD-EPI Creatinine Equation (2021)    Anion gap 13 5 - 15    Comment: Performed at [facility], [place] [address], [place], [place] [address]  CBC with Differential     Status: Abnormal   Collection Time: 10/28/21 12:19 PM  Result Value Ref Range   WBC 10.3 4.0 - 10.5 K/uL   RBC 4.49 4.22 - 5.81 MIL/uL   Hemoglobin 14.6 13.0 - 17.0 g/dL   HCT 40.8 39.0 - 52.0 %   MCV 90.9 80.0 - 100.0 fL   MCH 32.5 26.0 - 34.0 pg   MCHC 35.8 30.0 - 36.0 g/dL   RDW 11.9 11.5 - 15.5 %   Platelets 240 150 - 400 K/uL   nRBC 0.0 0.0 - 0.2 %   Neutrophils Relative % 87 %   Neutro Abs 8.9 (H) 1.7 - 7.7 K/uL   Lymphocytes Relative 4 %   Lymphs Abs 0.4 (L) 0.7 - 4.0 K/uL   Monocytes Relative 8 %   Monocytes Absolute 0.8 0.1 - 1.0 K/uL   Eosinophils Relative 0 %   Eosinophils Absolute 0.0 0.0 - 0.5  K/uL   Basophils Relative 0 %   Basophils Absolute 0.0 0.0 - 0.1 K/uL   Immature Granulocytes 1 %   Abs  Immature Granulocytes 0.07 0.00 - 0.07 K/uL    Comment: Performed at [facility], [place] [address], [place], [place] [address]  Troponin I (High Sensitivity)     Status: None   Collection Time: 10/28/21 12:19 PM  Result Value Ref Range   Troponin I (High Sensitivity) 5 <18 ng/L    Comment: (NOTE) Elevated high sensitivity troponin I (hsTnI) values and significant  changes across serial measurements may suggest ACS but many other  chronic and acute conditions are known to elevate hsTnI results.  Refer to the "Links" section for chest pain algorithms and additional  guidance. Performed at [facility], [place] [address] [place], [place] [address]    CT CHEST ABDOMEN PELVIS W CONTRAST  Result Date: 10/28/2021 CLINICAL DATA:  Trampled by cow 6 days ago, right rib pain and open wounds. EXAM: CT CHEST, ABDOMEN, AND PELVIS WITH CONTRAST TECHNIQUE: Multidetector CT imaging of the chest, abdomen and pelvis was performed following the standard protocol during bolus administration of intravenous contrast. RADIATION DOSE REDUCTION: This exam was performed according to the departmental dose-optimization program which includes automated exposure control, adjustment of the mA and/or kV according to patient size and/or use of iterative reconstruction technique. CONTRAST:  128m OMNIPAQUE IOHEXOL 300 MG/ML  SOLN COMPARISON:  Chest radiograph 08/10/2017 FINDINGS: CT CHEST FINDINGS Cardiovascular: Coronary, aortic arch, and branch vessel atherosclerotic vascular disease. Borderline cardiomegaly. Mediastinum/Nodes: Large type 3 hiatal hernia. Lungs/Pleura: No pneumothorax or pulmonary contusion. Mild atelectasis or scarring in the lingula. Musculoskeletal: Nondisplaced acute fractures the right anterolateral third and fourth ribs noted with displaced acute anterolateral right  fifth and sixth rib fractures. Probable nondisplaced right seventh rib fracture laterally. Possibly old right lateral eighth rib fracture. Old left posterior tenth and eleventh rib fractures noted. CT ABDOMEN PELVIS FINDINGS Hepatobiliary: Unremarkable Pancreas: Unremarkable Spleen: Unremarkable Adrenals/Urinary Tract: Both adrenal glands appear normal. Benign cysts of the right kidney noted including a right kidney upper pole cyst with faint marginal in septal calcification. These lesions do not require any further workup. Stomach/Bowel: Large type 3 hiatal hernia. Colonic diverticulosis notably in the ascending colon. No appendiceal inflammation but there are small diverticula of the appendix noted. Anastomotic staple line in the rectum with presacral soft tissue density which is probably related to therapy from prior regional tumor, correlate with patient history. Vascular/Lymphatic: Atherosclerosis is present, including aortoiliac atherosclerotic disease. No pathologic adenopathy. Reproductive: Unremarkable Other: No supplemental non-categorized findings. Musculoskeletal: Mild lumbar spondylosis and degenerative disc disease. A small ventral midline Richter hernia contains a margin of the transverse colon on image 122 series 5. Just below this is a separate small hernia containing adipose tissue. IMPRESSION: 1. Acute fractures of the right third, fourth, fifth, sixth, and seventh ribs. The fifth and sixth rib fractures are mildly displaced. No pneumothorax or pulmonary contusion. No other acute traumatic findings. 2. Old right lateral eighth rib fracture and old left posterior tenth and eleventh rib fractures. 3. There is a small ventral midline Richter hernia containing a margin of the transverse colon without findings of strangulation at this time. 4. Other imaging findings of potential clinical significance: Aortic Atherosclerosis (ICD10-I70.0). Coronary atherosclerosis. Borderline cardiomegaly. Large type 3  hiatal hernia. Colonic diverticulosis including diverticula of the appendix. Electronically Signed   By: WVan ClinesM.D.   On: 10/28/2021 14:14   CT HEAD WO CONTRAST (5MM)  Result Date: 10/28/2021 CLINICAL DATA:  Trampled by cow 6 days ago. Open wounds. Right rib pain. EXAM: CT HEAD WITHOUT CONTRAST  TECHNIQUE: Contiguous axial images were obtained from the base of the skull through the vertex without intravenous contrast. RADIATION DOSE REDUCTION: This exam was performed according to the departmental dose-optimization program which includes automated exposure control, adjustment of the mA and/or kV according to patient size and/or use of iterative reconstruction technique. COMPARISON:  Report from 10/11/1998 FINDINGS: Brain: The brainstem, cerebellum, cerebral peduncles, thalami, basal ganglia, basilar cisterns, and ventricular system appear within normal limits. Periventricular white matter and corona radiata hypodensities favor chronic ischemic microvascular white matter disease. Age-appropriate cerebral atrophy. No intracranial hemorrhage, mass lesion, or acute CVA. Vascular: There is atherosclerotic calcification of the cavernous carotid arteries bilaterally. Skull: Unremarkable Sinuses/Orbits: Mild chronic ethmoid sinusitis. Other: No supplemental non-categorized findings. IMPRESSION: 1. No acute intracranial findings. 2. Periventricular white matter and corona radiata hypodensities favor chronic ischemic microvascular white matter disease. 3. Mild chronic ethmoid sinusitis. 4. Atherosclerosis. Electronically Signed   By: Van Clines M.D.   On: 10/28/2021 13:42   DG Tibia/Fibula Right  Result Date: 10/28/2021 CLINICAL DATA:  Wound EXAM: RIGHT TIBIA AND FIBULA - 2 VIEW COMPARISON:  None Available. FINDINGS: Alignment is anatomic. No acute fracture. No cortical erosion or periosteal reaction. No soft tissue air. IMPRESSION: Negative. Electronically Signed   By: Macy Mis M.D.   On:  10/28/2021 12:12    Anti-infectives (From admission, onward)    Start     Dose/Rate Route Frequency Ordered Stop   10/28/21 1145  vancomycin (VANCOCIN) IVPB 1000 mg/200 mL premix        1,000 mg 200 mL/hr over 60 Minutes Intravenous  Once 10/28/21 1131 10/28/21 1353       Assessment/Plan Hit and Trampled by 1/2 Ton Cow  R 3-7 rib fx's - no ptx on CT. Pulm toilet, multimodal pain control. PT/OT eval Remote/old rib fx R 8, 10, and 11th rib fx's B lateral proximal thigh pain - femur films of both extremities.  Suspect these will be negative given location of pain, but will make sure. - Per [facility] -  R leg wound - no acute needs for debridement. No fx on xray. On abx.  Tetanus ordered as he is not up to date.  Defer further to primary service regarding further care.  Agree with WOC consult for wound care recommendations at this point.  If this worsens or fails to improve, may need ortho to evaluate.  Irregular rhythm - per medicine Hx HTN Hx HLD Hx Asthma Remote Hx of Rectal CA  Remote Hx of eyelid CA Aortic Atherosclerosis  Coronary atherosclerosis  FEN - Okay for diet from our standpoint, IVF per [facility] VTE - SCDs, okay for chemical ppx from our standpoint ID - On Vanc, flagyl, cefepime Foley - None  Dispo - Admit to [facility]. Follow up on femur films.  Discussed with Dr. Bonner Puna on the phone and at bedside.  I reviewed ED provider notes, hospitalist notes, last 24 h vitals and pain scores, last 48 h intake and output, last 24 h labs and trends, and last 24 h imaging results  Henreitta Cea, [facility] Surgery 10/28/2021, 3:35 PM Please see Amion for pager number during day hours 7:00am-4:30pm

## 2021-10-29 ENCOUNTER — Inpatient Hospital Stay (HOSPITAL_COMMUNITY): Payer: Medicare HMO

## 2021-10-29 ENCOUNTER — Ambulatory Visit: Payer: Medicare HMO

## 2021-10-29 DIAGNOSIS — Z8679 Personal history of other diseases of the circulatory system: Secondary | ICD-10-CM

## 2021-10-29 DIAGNOSIS — I700 Atherosclerosis of aorta: Secondary | ICD-10-CM | POA: Diagnosis not present

## 2021-10-29 DIAGNOSIS — M7989 Other specified soft tissue disorders: Secondary | ICD-10-CM

## 2021-10-29 DIAGNOSIS — R011 Cardiac murmur, unspecified: Secondary | ICD-10-CM | POA: Diagnosis not present

## 2021-10-29 DIAGNOSIS — I440 Atrioventricular block, first degree: Secondary | ICD-10-CM | POA: Diagnosis present

## 2021-10-29 DIAGNOSIS — Z66 Do not resuscitate: Secondary | ICD-10-CM | POA: Diagnosis not present

## 2021-10-29 DIAGNOSIS — W5522XA Struck by cow, initial encounter: Secondary | ICD-10-CM

## 2021-10-29 DIAGNOSIS — J4540 Moderate persistent asthma, uncomplicated: Secondary | ICD-10-CM | POA: Diagnosis not present

## 2021-10-29 DIAGNOSIS — S2241XD Multiple fractures of ribs, right side, subsequent encounter for fracture with routine healing: Secondary | ICD-10-CM

## 2021-10-29 DIAGNOSIS — L03115 Cellulitis of right lower limb: Secondary | ICD-10-CM | POA: Diagnosis not present

## 2021-10-29 DIAGNOSIS — Z8673 Personal history of transient ischemic attack (TIA), and cerebral infarction without residual deficits: Secondary | ICD-10-CM

## 2021-10-29 LAB — CBC
HCT: 37.3 % — ABNORMAL LOW (ref 39.0–52.0)
Hemoglobin: 13 g/dL (ref 13.0–17.0)
MCH: 32.2 pg (ref 26.0–34.0)
MCHC: 34.9 g/dL (ref 30.0–36.0)
MCV: 92.3 fL (ref 80.0–100.0)
Platelets: 227 10*3/uL (ref 150–400)
RBC: 4.04 MIL/uL — ABNORMAL LOW (ref 4.22–5.81)
RDW: 12 % (ref 11.5–15.5)
WBC: 6.4 10*3/uL (ref 4.0–10.5)
nRBC: 0 % (ref 0.0–0.2)

## 2021-10-29 LAB — ECHOCARDIOGRAM COMPLETE
AR max vel: 2.57 cm2
AV Area VTI: 2.61 cm2
AV Area mean vel: 2.53 cm2
AV Mean grad: 7 mmHg
AV Peak grad: 13.5 mmHg
Ao pk vel: 1.84 m/s
Area-P 1/2: 10.25 cm2
Height: 68 in
P 1/2 time: 365 msec
S' Lateral: 2.3 cm
Weight: 2272.01 oz

## 2021-10-29 LAB — COMPREHENSIVE METABOLIC PANEL
ALT: 18 U/L (ref 0–44)
AST: 22 U/L (ref 15–41)
Albumin: 3.4 g/dL — ABNORMAL LOW (ref 3.5–5.0)
Alkaline Phosphatase: 59 U/L (ref 38–126)
Anion gap: 8 (ref 5–15)
BUN: 18 mg/dL (ref 8–23)
CO2: 26 mmol/L (ref 22–32)
Calcium: 8.9 mg/dL (ref 8.9–10.3)
Chloride: 93 mmol/L — ABNORMAL LOW (ref 98–111)
Creatinine, Ser: 0.81 mg/dL (ref 0.61–1.24)
GFR, Estimated: >60 mL/min (ref >60)
Glucose, Bld: 114 mg/dL — ABNORMAL HIGH (ref 70–99)
Potassium: 4.7 mmol/L (ref 3.5–5.1)
Sodium: 127 mmol/L — ABNORMAL LOW (ref 135–145)
Total Bilirubin: 1 mg/dL (ref 0.3–1.2)
Total Protein: 6.4 g/dL — ABNORMAL LOW (ref 6.5–8.1)

## 2021-10-29 LAB — LIPID PANEL
Cholesterol: 115 mg/dL (ref 0–200)
HDL: 47 mg/dL (ref >40)
LDL Cholesterol: 59 mg/dL (ref 0–99)
Total CHOL/HDL Ratio: 2.4 RATIO
Triglycerides: 46 mg/dL (ref <150)
VLDL: 9 mg/dL (ref 0–40)

## 2021-10-29 LAB — TSH: TSH: 2.309 u[IU]/mL (ref 0.350–4.500)

## 2021-10-29 MED ORDER — MEDIHONEY WOUND/BURN DRESSING EX PSTE
1.0000 | PASTE | Freq: Every day | CUTANEOUS | Status: DC
  Administered 2021-10-29 – 2021-10-31 (×3): 1 via TOPICAL
  Filled 2021-10-29 (×2): qty 44

## 2021-10-29 MED ORDER — MELATONIN 3 MG PO TABS
3.0000 mg | ORAL_TABLET | Freq: Every evening | ORAL | Status: DC | PRN
  Administered 2021-10-29 – 2021-10-30 (×2): 3 mg via ORAL
  Filled 2021-10-29 (×2): qty 1

## 2021-10-29 MED ORDER — FUROSEMIDE 40 MG PO TABS
40.0000 mg | ORAL_TABLET | Freq: Every day | ORAL | Status: DC
  Administered 2021-10-29 – 2021-10-31 (×3): 40 mg via ORAL
  Filled 2021-10-29 (×3): qty 1

## 2021-10-29 NOTE — Progress Notes (Signed)
  Echocardiogram 2D Echocardiogram has been performed.  Joette Catching 10/29/2021, 8:44 AM

## 2021-10-29 NOTE — Progress Notes (Signed)
Subjective: CC: About to get echo. Some R sided rib pain and R leg pain that is stable and well controlled with medications. No other complaints. No sob. Has not gotten oob. Tolerating diet. Voiding.   Objective: Vital signs in last 24 hours: Temp:  [97.3 F (36.3 C)-97.8 F (36.6 C)] 97.3 F (36.3 C) (06/30 0501) Pulse Rate:  [55-89] 66 (06/30 0501) Resp:  [13-23] 18 (06/30 0501) BP: (117-176)/(73-101) 157/81 (06/30 0501) SpO2:  [95 %-99 %] 96 % (06/30 0636) Weight:  [64.4 kg] 64.4 kg (06/29 1044) Last BM Date :  (PTA)  Intake/Output from previous day: 06/29 0701 - 06/30 0700 In: 1022 [IV Piggyback:1022] Out: 400 [Urine:400] Intake/Output this shift: No intake/output data recorded.  PE: Gen:  Alert, NAD, pleasant Card:  Reg rate Pulm:  CTAB, no W/R/R, effort normal. On room air Abd: Soft, ND, NT, +BS, 2 small incisional hernias noted.  One supraumbilical and one umbilical.  Soft, NT and reducible. Ext: Moves LUE/RUE/LLE without difficulty. Open wound on the medial aspect of the RLE below the level of the knee with some edema and erythema that appears stable from the picture yesterday.  No purulent drainage.  No gross necrotic tissue.  This is warm to the touch.  He has normal ROM of the ankle and knee. Palpable pedal pulses. Compartments appear soft           Psych: A&Ox3   Lab Results:  Recent Labs    10/28/21 1219 10/29/21 0546  WBC 10.3 6.4  HGB 14.6 13.0  HCT 40.8 37.3*  PLT 240 227   BMET Recent Labs    10/28/21 1844 10/29/21 0546  NA 125* 127*  K 4.2 4.7  CL 91* 93*  CO2 23 26  GLUCOSE 125* 114*  BUN 12 18  CREATININE 0.81 0.81  CALCIUM 8.9 8.9   PT/INR No results for input(s): "LABPROT", "INR" in the last 72 hours. CMP     Component Value Date/Time   NA 127 (L) 10/29/2021 0546   NA 131 (L) 08/24/2021 0907   NA 142 05/04/2017 0741   NA 140 05/05/2016 0747   K 4.7 10/29/2021 0546   K 4.1 05/04/2017 0741   K 4.1 05/05/2016 0747   CL  93 (L) 10/29/2021 0546   CL 99 05/04/2017 0741   CO2 26 10/29/2021 0546   CO2 29 05/04/2017 0741   CO2 27 05/05/2016 0747   GLUCOSE 114 (H) 10/29/2021 0546   GLUCOSE 101 05/04/2017 0741   BUN 18 10/29/2021 0546   BUN 15 08/24/2021 0907   BUN 13 05/04/2017 0741   BUN 20.5 05/05/2016 0747   CREATININE 0.81 10/29/2021 0546   CREATININE 0.84 05/05/2021 0738   CREATININE 0.8 05/04/2017 0741   CREATININE 1.0 05/05/2016 0747   CALCIUM 8.9 10/29/2021 0546   CALCIUM 9.7 05/04/2017 0741   CALCIUM 10.2 05/05/2016 0747   PROT 6.4 (L) 10/29/2021 0546   PROT 7.6 08/24/2021 0907   PROT 7.5 05/04/2017 0741   PROT 7.7 05/05/2016 0747   ALBUMIN 3.4 (L) 10/29/2021 0546   ALBUMIN 4.5 08/24/2021 0907   ALBUMIN 4.2 05/05/2016 0747   AST 22 10/29/2021 0546   AST 32 05/05/2021 0738   AST 30 05/05/2016 0747   ALT 18 10/29/2021 0546   ALT 32 05/05/2021 0738   ALT 36 05/04/2017 0741   ALT 46 05/05/2016 0747   ALKPHOS 59 10/29/2021 0546   ALKPHOS 62 05/04/2017 0741   ALKPHOS 79  05/05/2016 0747   BILITOT 1.0 10/29/2021 0546   BILITOT 0.9 08/24/2021 0907   BILITOT 0.9 05/05/2021 0738   BILITOT 0.74 05/05/2016 0747   GFRNONAA >60 10/29/2021 0546   GFRNONAA >60 05/05/2021 0738   GFRAA 97 11/20/2019 0856   GFRAA >60 05/06/2019 0758   Lipase  No results found for: "LIPASE"  Studies/Results: ECHOCARDIOGRAM COMPLETE  Result Date: 10/29/2021    ECHOCARDIOGRAM REPORT   Patient Name:   SHAWNDELL SCHILLACI Date of Exam: 10/29/2021 Medical Rec #:  [id number]       Height:       68.0 in Accession #:    [id number]      Weight:       142.0 lb Date of Birth:  1930-12-06      BSA:          1.767 m Patient Age:    88 years        BP:           157/81 mmHg Patient Gender: M               HR:           88 bpm. Exam Location:  Inpatient Procedure: 2D Echo, Cardiac Doppler and Color Doppler Indications:    Murmur  History:        Patient has no prior history of Echocardiogram examinations.                 Risk  Factors:Hypertension and HLD.  Sonographer:    Joette Catching RCS Referring Phys: [place]  1. Left ventricular ejection fraction, by estimation, is 60 to 65%. The left ventricle has normal function. The left ventricle has no regional wall motion abnormalities. Left ventricular diastolic parameters were normal.  2. Right ventricular systolic function is normal. The right ventricular size is normal.  3. The mitral valve is abnormal. Mild mitral valve regurgitation. No evidence of mitral stenosis.  4. The aortic valve is tricuspid. There is mild calcification of the aortic valve. Aortic valve regurgitation is trivial. Aortic valve sclerosis is present, with no evidence of aortic valve stenosis.  5. The inferior vena cava is normal in size with greater than 50% respiratory variability, suggesting right atrial pressure of 3 mmHg. FINDINGS  Left Ventricle: Left ventricular ejection fraction, by estimation, is 60 to 65%. The left ventricle has normal function. The left ventricle has no regional wall motion abnormalities. The left ventricular internal cavity size was normal in size. There is  no left ventricular hypertrophy. Left ventricular diastolic parameters were normal. Right Ventricle: The right ventricular size is normal. No increase in right ventricular wall thickness. Right ventricular systolic function is normal. Left Atrium: Left atrial size was normal in size. Right Atrium: Right atrial size was normal in size. Pericardium: There is no evidence of pericardial effusion. Mitral Valve: The mitral valve is abnormal. There is mild thickening of the mitral valve leaflet(s). Mild mitral valve regurgitation. No evidence of mitral valve stenosis. Tricuspid Valve: The tricuspid valve is normal in structure. Tricuspid valve regurgitation is mild . No evidence of tricuspid stenosis. Aortic Valve: The aortic valve is tricuspid. There is mild calcification of the aortic valve. Aortic valve regurgitation  is trivial. Aortic regurgitation PHT measures 365 msec. Aortic valve sclerosis is present, with no evidence of aortic valve stenosis.  Aortic valve mean gradient measures 7.0 mmHg. Aortic valve peak gradient measures 13.5 mmHg. Aortic valve area, by VTI measures 2.61 cm. Pulmonic  Valve: The pulmonic valve was normal in structure. Pulmonic valve regurgitation is not visualized. No evidence of pulmonic stenosis. Aorta: The aortic root is normal in size and structure. Venous: The inferior vena cava is normal in size with greater than 50% respiratory variability, suggesting right atrial pressure of 3 mmHg. IAS/Shunts: No atrial level shunt detected by color flow Doppler.  LEFT VENTRICLE PLAX 2D LVIDd:         3.50 cm   Diastology LVIDs:         2.30 cm   LV e' medial:    4.68 cm/s LV PW:         1.00 cm   LV E/e' medial:  18.1 LV IVS:        1.00 cm   LV e' lateral:   7.29 cm/s LVOT diam:     2.10 cm   LV E/e' lateral: 11.6 LV SV:         81 LV SV Index:   46 LVOT Area:     3.46 cm  RIGHT VENTRICLE             IVC RV Basal diam:  3.70 cm     IVC diam: 1.30 cm RV Mid diam:    3.30 cm RV S prime:     15.30 cm/s TAPSE (M-mode): 1.4 cm LEFT ATRIUM           Index        RIGHT ATRIUM           Index LA diam:      3.10 cm 1.75 cm/m   RA Area:     13.00 cm LA Vol (A2C): 21.2 ml 12.00 ml/m  RA Volume:   29.90 ml  16.92 ml/m LA Vol (A4C): 40.9 ml 23.15 ml/m  AORTIC VALVE                     PULMONIC VALVE AV Area (Vmax):    2.57 cm      PV Vmax:       1.63 m/s AV Area (Vmean):   2.53 cm      PV Peak grad:  10.6 mmHg AV Area (VTI):     2.61 cm AV Vmax:           183.50 cm/s AV Vmean:          120.500 cm/s AV VTI:            0.310 m AV Peak Grad:      13.5 mmHg AV Mean Grad:      7.0 mmHg LVOT Vmax:         136.00 cm/s LVOT Vmean:        87.900 cm/s LVOT VTI:          0.234 m LVOT/AV VTI ratio: 0.75 AI PHT:            365 msec  AORTA Ao Root diam: 3.80 cm Ao Asc diam:  3.30 cm MITRAL VALVE                TRICUSPID VALVE  MV Area (PHT): 10.25 cm    TR Peak grad:   39.7 mmHg MV Decel Time: 74 msec      TR Vmax:        315.00 cm/s MV E velocity: 84.60 cm/s MV A velocity: 135.00 cm/s  SHUNTS MV E/A ratio:  0.63         Systemic VTI:  0.23 m  Systemic Diam: 2.10 cm Jenkins Rouge MD Electronically signed by Jenkins Rouge MD Signature Date/Time: 10/29/2021/9:01:38 AM    Final    DG FEMUR PORT MIN 2 VIEWS LEFT  Result Date: 10/28/2021 CLINICAL DATA:  Struck by a cow. EXAM: LEFT FEMUR PORTABLE 2 VIEWS COMPARISON:  None Available. FINDINGS: There is no evidence of fracture or other focal bone lesions. Soft tissues are unremarkable. IMPRESSION: Negative. Electronically Signed   By: Fidela Salisbury M.D.   On: 10/28/2021 17:15   DG FEMUR PORT, MIN 2 VIEWS RIGHT  Result Date: 10/28/2021 CLINICAL DATA:  Wound, knocked down by CT. EXAM: RIGHT FEMUR PORTABLE 2 VIEW COMPARISON:  None Available. FINDINGS: There is no evidence of fracture or other focal bone lesions. Soft tissues are unremarkable. There is residual contrast in the bladder. Surgical clips are seen in the pelvis. IMPRESSION: No acute fracture or dislocation. Electronically Signed   By: Ronney Asters M.D.   On: 10/28/2021 17:13   CT CHEST ABDOMEN PELVIS W CONTRAST  Result Date: 10/28/2021 CLINICAL DATA:  Trampled by cow 6 days ago, right rib pain and open wounds. EXAM: CT CHEST, ABDOMEN, AND PELVIS WITH CONTRAST TECHNIQUE: Multidetector CT imaging of the chest, abdomen and pelvis was performed following the standard protocol during bolus administration of intravenous contrast. RADIATION DOSE REDUCTION: This exam was performed according to the departmental dose-optimization program which includes automated exposure control, adjustment of the mA and/or kV according to patient size and/or use of iterative reconstruction technique. CONTRAST:  168m OMNIPAQUE IOHEXOL 300 MG/ML  SOLN COMPARISON:  Chest radiograph 08/10/2017 FINDINGS: CT CHEST FINDINGS  Cardiovascular: Coronary, aortic arch, and branch vessel atherosclerotic vascular disease. Borderline cardiomegaly. Mediastinum/Nodes: Large type 3 hiatal hernia. Lungs/Pleura: No pneumothorax or pulmonary contusion. Mild atelectasis or scarring in the lingula. Musculoskeletal: Nondisplaced acute fractures the right anterolateral third and fourth ribs noted with displaced acute anterolateral right fifth and sixth rib fractures. Probable nondisplaced right seventh rib fracture laterally. Possibly old right lateral eighth rib fracture. Old left posterior tenth and eleventh rib fractures noted. CT ABDOMEN PELVIS FINDINGS Hepatobiliary: Unremarkable Pancreas: Unremarkable Spleen: Unremarkable Adrenals/Urinary Tract: Both adrenal glands appear normal. Benign cysts of the right kidney noted including a right kidney upper pole cyst with faint marginal in septal calcification. These lesions do not require any further workup. Stomach/Bowel: Large type 3 hiatal hernia. Colonic diverticulosis notably in the ascending colon. No appendiceal inflammation but there are small diverticula of the appendix noted. Anastomotic staple line in the rectum with presacral soft tissue density which is probably related to therapy from prior regional tumor, correlate with patient history. Vascular/Lymphatic: Atherosclerosis is present, including aortoiliac atherosclerotic disease. No pathologic adenopathy. Reproductive: Unremarkable Other: No supplemental non-categorized findings. Musculoskeletal: Mild lumbar spondylosis and degenerative disc disease. A small ventral midline Richter hernia contains a margin of the transverse colon on image 122 series 5. Just below this is a separate small hernia containing adipose tissue. IMPRESSION: 1. Acute fractures of the right third, fourth, fifth, sixth, and seventh ribs. The fifth and sixth rib fractures are mildly displaced. No pneumothorax or pulmonary contusion. No other acute traumatic findings. 2.  Old right lateral eighth rib fracture and old left posterior tenth and eleventh rib fractures. 3. There is a small ventral midline Richter hernia containing a margin of the transverse colon without findings of strangulation at this time. 4. Other imaging findings of potential clinical significance: Aortic Atherosclerosis (ICD10-I70.0). Coronary atherosclerosis. Borderline cardiomegaly. Large type 3 hiatal hernia. Colonic diverticulosis including diverticula of the  appendix. Electronically Signed   By: Van Clines M.D.   On: 10/28/2021 14:14   CT HEAD WO CONTRAST (5MM)  Result Date: 10/28/2021 CLINICAL DATA:  Trampled by cow 6 days ago. Open wounds. Right rib pain. EXAM: CT HEAD WITHOUT CONTRAST TECHNIQUE: Contiguous axial images were obtained from the base of the skull through the vertex without intravenous contrast. RADIATION DOSE REDUCTION: This exam was performed according to the departmental dose-optimization program which includes automated exposure control, adjustment of the mA and/or kV according to patient size and/or use of iterative reconstruction technique. COMPARISON:  Report from 10/11/1998 FINDINGS: Brain: The brainstem, cerebellum, cerebral peduncles, thalami, basal ganglia, basilar cisterns, and ventricular system appear within normal limits. Periventricular white matter and corona radiata hypodensities favor chronic ischemic microvascular white matter disease. Age-appropriate cerebral atrophy. No intracranial hemorrhage, mass lesion, or acute CVA. Vascular: There is atherosclerotic calcification of the cavernous carotid arteries bilaterally. Skull: Unremarkable Sinuses/Orbits: Mild chronic ethmoid sinusitis. Other: No supplemental non-categorized findings. IMPRESSION: 1. No acute intracranial findings. 2. Periventricular white matter and corona radiata hypodensities favor chronic ischemic microvascular white matter disease. 3. Mild chronic ethmoid sinusitis. 4. Atherosclerosis.  Electronically Signed   By: Van Clines M.D.   On: 10/28/2021 13:42   DG Tibia/Fibula Right  Result Date: 10/28/2021 CLINICAL DATA:  Wound EXAM: RIGHT TIBIA AND FIBULA - 2 VIEW COMPARISON:  None Available. FINDINGS: Alignment is anatomic. No acute fracture. No cortical erosion or periosteal reaction. No soft tissue air. IMPRESSION: Negative. Electronically Signed   By: Macy Mis M.D.   On: 10/28/2021 12:12    Anti-infectives: Anti-infectives (From admission, onward)    Start     Dose/Rate Route Frequency Ordered Stop   10/29/21 1200  vancomycin (VANCOCIN) IVPB 1000 mg/200 mL premix        1,000 mg 200 mL/hr over 60 Minutes Intravenous Every 24 hours 10/28/21 1723     10/28/21 1700  ceFEPIme (MAXIPIME) 2 g in sodium chloride 0.9 % 100 mL IVPB        2 g 200 mL/hr over 30 Minutes Intravenous Every 12 hours 10/28/21 1610     10/28/21 1600  metroNIDAZOLE (FLAGYL) IVPB 500 mg        500 mg 100 mL/hr over 60 Minutes Intravenous Every 12 hours 10/28/21 1536     10/28/21 1145  vancomycin (VANCOCIN) IVPB 1000 mg/200 mL premix        1,000 mg 200 mL/hr over 60 Minutes Intravenous  Once 10/28/21 1131 10/28/21 1353        Assessment/Plan Hit and Trampled by 1/2 Ton Cow  R 3-7 rib fx's - no ptx on CT. Pulm toilet (ordered IS/FV today), multimodal pain control. PT/OT eval Remote/old rib fx R 8, 10, and 11th rib fx's B lateral proximal thigh pain - femur films of both extremities neg for fx - Per [facility] -  R leg wound - no acute needs for debridement. No fx on xray. On abx.  Tdap updated.  Defer further to primary service regarding further care.  Agree with WOC consult for wound care recommendations at this point.  If this worsens or fails to improve, may need ortho to evaluate. Primary team obtaining ABI's.  Irregular rhythm - per medicine, getting echo Hyponatremia  Hx HTN Hx HLD Hx Asthma Remote Hx of Rectal CA  Remote Hx of eyelid CA Aortic Atherosclerosis  Coronary  atherosclerosis   FEN - Okay for diet from our standpoint, IVF per TRH VTE - SCDs, okay for  chemical ppx from our standpoint ID - On Vanc, flagyl, cefepime Foley - None  Plan- Cont pulm toilet and multimodal pain control for rib fx's. PT/OT evals pending. Bilateral femur films neg for fx. Defer further management of RLE wound to primary team. If this worsens or fails to improve, may need ortho to evaluate. Will discuss with MD if we will continue to follow.   LOS: 1 day    Jillyn Ledger , [facility] Surgery 10/29/2021, 10:05 AM Please see Amion for pager number during day hours 7:00am-4:30pm

## 2021-10-29 NOTE — Consult Note (Addendum)
[facility] Nurse Consult Note: Reason for Consult: Consult requested for right leg topical treatment recommendations. Surgical team is following for assessment and plan of care. The PA had just removed the dressing to assess the leg, so I did not visualize it, but reviewed photos in the EMR and discussed the plan of care with the surgical PA at the bedside. Please refer to their previous notes.  Wound type: Right leg with generalized edema and erythemia related to cellulitis.  Full thickness wounds are beefy red with  patchy areas of slough to right anterior calf. Pt is on systemic antibiotics for cellulitis. Dressing procedure/placement/frequency: Topical treatment provided for bedside nurses to perform to provide antimicrobial benefits and assist with removal of nonviable tissue as follows:  Apply Medihoney to right leg wound Q day, then cover with ABD pads and kerlex. Secure chat message sent to the primary team: Recommend home health assistance for dressing changes and follow-up at the outpatient wound care center for further assessment.  Primary team; please order if desired.  Please re-consult if further assistance is needed.  Thank-you,  Julien Girt MSN, [place], [place], [place], [place]

## 2021-10-29 NOTE — Evaluation (Signed)
Occupational Therapy Evaluation Patient Details Name: [name] MRN: [id number] DOB: [birth year] Today's Date: 10/29/2021   History of Present Illness 88 year old male who who presented to the ED on the advice of his PCP for worsening pain related to right leg wound. In the ED he began complaining of right chest pain worse with deep inspiration but was in no respiratory distress without hypoxia. CT chest/abd/pelvis demonstrated new right 3rd-7th rib fractures. Patient was reportedly hit/knocked down and then trampled by 1000 pound cow approximately 6 days ago.  Medical history of HTN, HLD, asthma, rectal CA s/p resection remotely, glaucoma   Clinical Impression   Patient evaluated by Occupational Therapy with no further acute OT needs identified. All education has been completed and the patient has no further questions.  See below for any follow-up Occupational Therapy or equipment needs. OT is signing off. Thank you for this referral.       Recommendations for follow up therapy are one component of a multi-disciplinary discharge planning process, led by the attending physician.  Recommendations may be updated based on patient status, additional functional criteria and insurance authorization.   Follow Up Recommendations  No OT follow up    Assistance Recommended at Discharge PRN  Patient can return home with the following Assistance with cooking/housework    Functional Status Assessment  Patient has not had a recent decline in their functional status  Equipment Recommendations  None recommended by OT    Recommendations for Other Services       Precautions / Restrictions Precautions Precautions: Fall Restrictions Weight Bearing Restrictions: No      Mobility Bed Mobility               General bed mobility comments: Pt received in recliner for OT.    Transfers                          Balance Overall balance assessment: Mild deficits observed, not  formally tested                                         ADL either performed or assessed with clinical judgement   ADL Overall ADL's : At baseline                                       General ADL Comments: With exception of new onset Bilateral hip pain and RT rib pain from cow attack, pt is able to perform his ADLs at his baseline including LE dressing, Toileting (simulated), bathroom mobility, and standing at the sink at least 10 min for shaving and oral care. Pt's sister-in-law in room and agrees that pt appears baseline with his basdic care and will have help at home.     Vision Baseline Vision/History: 0 No visual deficits Vision Assessment?: No apparent visual deficits     Perception     Praxis      Pertinent Vitals/Pain Pain Assessment Pain Assessment: 0-10 Pain Score: 5  Pain Location: RT hip and ribs Pain Intervention(s): Limited activity within patient's tolerance, Monitored during session     Hand Dominance Right   Extremity/Trunk Assessment Upper Extremity Assessment Upper Extremity Assessment: Overall WFL for tasks assessed   Lower Extremity Assessment Lower Extremity Assessment: Defer to  PT evaluation   Cervical / Trunk Assessment Cervical / Trunk Assessment: Normal   Communication Communication Communication: No difficulties   Cognition Arousal/Alertness: Awake/alert Behavior During Therapy: WFL for tasks assessed/performed Overall Cognitive Status: Within Functional Limits for tasks assessed                                 General Comments: A&Ox4     General Comments       Exercises     Shoulder Instructions      Home Living Family/patient expects to be discharged to:: Private residence Living Arrangements: Alone Available Help at Discharge: Family;Friend(s);Neighbor;Available PRN/intermittently Type of Home: House Home Access: Stairs to enter [employer] of Steps: 2 Entrance  Stairs-Rails: Left Home Layout: One level     Bathroom Shower/Tub: Occupational psychologist: Standard Bathroom Accessibility: Yes       Additional Comments: has about 100 cows at home. Neighbor helps      Prior Functioning/Environment Prior Level of Function : Independent/Modified Independent;Driving             Mobility Comments: denies history of falls. no AD use at baseline. ADLs Comments: Housekeeper 1x/month        OT Problem List: Pain      OT Treatment/Interventions:      OT Goals(Current goals can be found in the care plan section) Acute Rehab OT Goals Patient Stated Goal: To go home OT Goal Formulation: All assessment and education complete, DC therapy  OT Frequency:      Co-evaluation              AM-PAC OT "6 Clicks" Daily Activity     Outcome Measure Help from another person eating meals?: None Help from another person taking care of personal grooming?: None Help from another person toileting, which includes using toliet, bedpan, or urinal?: None Help from another person bathing (including washing, rinsing, drying)?: None Help from another person to put on and taking off regular upper body clothing?: None Help from another person to put on and taking off regular lower body clothing?: None 6 Click Score: 24   End of Session Equipment Utilized During Treatment:  (none)  Activity Tolerance: Patient tolerated treatment well Patient left: in chair;with family/visitor present  OT Visit Diagnosis: Pain Pain - Right/Left: Right Pain - part of body: Hip;Leg (RT ribs, and LT hip)                Time: 2637-8588 OT Time Calculation (min): 30 min Charges:  OT General Charges $OT Visit: 1 Visit OT Evaluation $OT Eval Moderate Complexity: 1 Mod  Nethra Mehlberg, OT Acute Rehab Services Office: [phone] 10/29/2021  [name] 10/29/2021, 12:15 PM
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
## 2021-10-29 NOTE — Progress Notes (Signed)
  Transition of Care [facility]) Screening Note   Patient Details  Name: [name] Date of Birth: 01/30/1931   Transition of Care [facility]) CM/SW Contact:    Kelin Borum, Marjie Skiff, RN Phone Number: 10/29/2021, 12:38 PM    Transition of Care Department [facility]) has reviewed patient and no TOC needs have been identified at this time. We will continue to monitor patient advancement through interdisciplinary progression rounds. If new patient transition needs arise, please place a TOC consult.

## 2021-10-29 NOTE — Progress Notes (Signed)
ABI has been completed.   Results can be found under chart review under CV PROC. 10/29/2021 2:39 PM Reese Senk RVT, RDMS
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
## 2021-10-30 DIAGNOSIS — I700 Atherosclerosis of aorta: Secondary | ICD-10-CM | POA: Diagnosis not present

## 2021-10-30 DIAGNOSIS — L03115 Cellulitis of right lower limb: Secondary | ICD-10-CM | POA: Diagnosis not present

## 2021-10-30 DIAGNOSIS — Z66 Do not resuscitate: Secondary | ICD-10-CM | POA: Diagnosis not present

## 2021-10-30 DIAGNOSIS — K449 Diaphragmatic hernia without obstruction or gangrene: Secondary | ICD-10-CM

## 2021-10-30 DIAGNOSIS — J4540 Moderate persistent asthma, uncomplicated: Secondary | ICD-10-CM | POA: Diagnosis not present

## 2021-10-30 DIAGNOSIS — R109 Unspecified abdominal pain: Secondary | ICD-10-CM

## 2021-10-30 DIAGNOSIS — K5900 Constipation, unspecified: Secondary | ICD-10-CM

## 2021-10-30 LAB — CBC WITH DIFFERENTIAL/PLATELET
Abs Immature Granulocytes: 0.05 10*3/uL (ref 0.00–0.07)
Basophils Absolute: 0 10*3/uL (ref 0.0–0.1)
Basophils Relative: 0 %
Eosinophils Absolute: 0.3 10*3/uL (ref 0.0–0.5)
Eosinophils Relative: 4 %
HCT: 34 % — ABNORMAL LOW (ref 39.0–52.0)
Hemoglobin: 12 g/dL — ABNORMAL LOW (ref 13.0–17.0)
Immature Granulocytes: 1 %
Lymphocytes Relative: 7 %
Lymphs Abs: 0.5 10*3/uL — ABNORMAL LOW (ref 0.7–4.0)
MCH: 32.6 pg (ref 26.0–34.0)
MCHC: 35.3 g/dL (ref 30.0–36.0)
MCV: 92.4 fL (ref 80.0–100.0)
Monocytes Absolute: 0.8 10*3/uL (ref 0.1–1.0)
Monocytes Relative: 12 %
Neutro Abs: 5.1 10*3/uL (ref 1.7–7.7)
Neutrophils Relative %: 76 %
Platelets: 219 10*3/uL (ref 150–400)
RBC: 3.68 MIL/uL — ABNORMAL LOW (ref 4.22–5.81)
RDW: 12.2 % (ref 11.5–15.5)
WBC: 6.7 10*3/uL (ref 4.0–10.5)
nRBC: 0 % (ref 0.0–0.2)

## 2021-10-30 LAB — WOUND CULTURE: Organism ID, Bacteria: NONE SEEN

## 2021-10-30 LAB — COMPREHENSIVE METABOLIC PANEL
ALT: 16 U/L (ref 0–44)
AST: 17 U/L (ref 15–41)
Albumin: 2.9 g/dL — ABNORMAL LOW (ref 3.5–5.0)
Alkaline Phosphatase: 48 U/L (ref 38–126)
Anion gap: 8 (ref 5–15)
BUN: 16 mg/dL (ref 8–23)
CO2: 25 mmol/L (ref 22–32)
Calcium: 8.7 mg/dL — ABNORMAL LOW (ref 8.9–10.3)
Chloride: 99 mmol/L (ref 98–111)
Creatinine, Ser: 0.71 mg/dL (ref 0.61–1.24)
GFR, Estimated: >60 mL/min (ref >60)
Glucose, Bld: 105 mg/dL — ABNORMAL HIGH (ref 70–99)
Potassium: 4.1 mmol/L (ref 3.5–5.1)
Sodium: 132 mmol/L — ABNORMAL LOW (ref 135–145)
Total Bilirubin: 1 mg/dL (ref 0.3–1.2)
Total Protein: 5.8 g/dL — ABNORMAL LOW (ref 6.5–8.1)

## 2021-10-30 LAB — C-REACTIVE PROTEIN: CRP: 1.2 mg/dL — ABNORMAL HIGH (ref <1.0)

## 2021-10-30 LAB — MAGNESIUM: Magnesium: 1.8 mg/dL (ref 1.7–2.4)

## 2021-10-30 LAB — SEDIMENTATION RATE: Sed Rate: 12 mm/hr (ref 0–16)

## 2021-10-30 LAB — CK: Total CK: 46 U/L — ABNORMAL LOW (ref 49–397)

## 2021-10-30 LAB — PHOSPHORUS: Phosphorus: 4.3 mg/dL (ref 2.5–4.6)

## 2021-10-30 MED ORDER — SODIUM CHLORIDE 0.9 % IV SOLN
2.0000 g | INTRAVENOUS | Status: DC
  Administered 2021-10-30: 2 g via INTRAVENOUS
  Filled 2021-10-30: qty 20

## 2021-10-30 MED ORDER — SENNOSIDES-DOCUSATE SODIUM 8.6-50 MG PO TABS
1.0000 | ORAL_TABLET | Freq: Two times a day (BID) | ORAL | Status: DC
  Administered 2021-10-30 – 2021-10-31 (×3): 1 via ORAL
  Filled 2021-10-30 (×3): qty 1

## 2021-10-30 MED ORDER — POLYETHYLENE GLYCOL 3350 17 G PO PACK
17.0000 g | PACK | Freq: Two times a day (BID) | ORAL | Status: DC
  Administered 2021-10-30 (×2): 17 g via ORAL
  Filled 2021-10-30 (×4): qty 1
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
## 2021-10-31 DIAGNOSIS — I10 Essential (primary) hypertension: Secondary | ICD-10-CM | POA: Diagnosis not present

## 2021-10-31 DIAGNOSIS — S80811A Abrasion, right lower leg, initial encounter: Secondary | ICD-10-CM | POA: Diagnosis not present

## 2021-10-31 DIAGNOSIS — Z23 Encounter for immunization: Secondary | ICD-10-CM | POA: Diagnosis present

## 2021-10-31 DIAGNOSIS — K449 Diaphragmatic hernia without obstruction or gangrene: Secondary | ICD-10-CM | POA: Diagnosis not present

## 2021-10-31 DIAGNOSIS — Z8249 Family history of ischemic heart disease and other diseases of the circulatory system: Secondary | ICD-10-CM | POA: Diagnosis not present

## 2021-10-31 DIAGNOSIS — L308 Other specified dermatitis: Secondary | ICD-10-CM | POA: Diagnosis not present

## 2021-10-31 DIAGNOSIS — K5900 Constipation, unspecified: Secondary | ICD-10-CM | POA: Diagnosis not present

## 2021-10-31 DIAGNOSIS — S2241XA Multiple fractures of ribs, right side, initial encounter for closed fracture: Secondary | ICD-10-CM | POA: Diagnosis not present

## 2021-10-31 DIAGNOSIS — Z66 Do not resuscitate: Secondary | ICD-10-CM | POA: Diagnosis not present

## 2021-10-31 DIAGNOSIS — Z79899 Other long term (current) drug therapy: Secondary | ICD-10-CM | POA: Diagnosis not present

## 2021-10-31 DIAGNOSIS — M25551 Pain in right hip: Secondary | ICD-10-CM | POA: Diagnosis not present

## 2021-10-31 DIAGNOSIS — W5522XA Struck by cow, initial encounter: Secondary | ICD-10-CM | POA: Diagnosis not present

## 2021-10-31 DIAGNOSIS — R17 Unspecified jaundice: Secondary | ICD-10-CM | POA: Diagnosis not present

## 2021-10-31 DIAGNOSIS — L409 Psoriasis, unspecified: Secondary | ICD-10-CM | POA: Diagnosis not present

## 2021-10-31 DIAGNOSIS — Z8673 Personal history of transient ischemic attack (TIA), and cerebral infarction without residual deficits: Secondary | ICD-10-CM | POA: Diagnosis not present

## 2021-10-31 DIAGNOSIS — Z823 Family history of stroke: Secondary | ICD-10-CM | POA: Diagnosis not present

## 2021-10-31 DIAGNOSIS — H409 Unspecified glaucoma: Secondary | ICD-10-CM | POA: Diagnosis not present

## 2021-10-31 DIAGNOSIS — J4540 Moderate persistent asthma, uncomplicated: Secondary | ICD-10-CM | POA: Diagnosis not present

## 2021-10-31 DIAGNOSIS — I700 Atherosclerosis of aorta: Secondary | ICD-10-CM | POA: Diagnosis not present

## 2021-10-31 DIAGNOSIS — I440 Atrioventricular block, first degree: Secondary | ICD-10-CM | POA: Diagnosis not present

## 2021-10-31 DIAGNOSIS — E871 Hypo-osmolality and hyponatremia: Secondary | ICD-10-CM | POA: Diagnosis not present

## 2021-10-31 DIAGNOSIS — L0390 Cellulitis, unspecified: Secondary | ICD-10-CM | POA: Diagnosis present

## 2021-10-31 DIAGNOSIS — Z85048 Personal history of other malignant neoplasm of rectum, rectosigmoid junction, and anus: Secondary | ICD-10-CM | POA: Diagnosis not present

## 2021-10-31 DIAGNOSIS — L03115 Cellulitis of right lower limb: Secondary | ICD-10-CM | POA: Diagnosis not present

## 2021-10-31 DIAGNOSIS — M25552 Pain in left hip: Secondary | ICD-10-CM | POA: Diagnosis not present

## 2021-10-31 LAB — RENAL FUNCTION PANEL
Albumin: 3.1 g/dL — ABNORMAL LOW (ref 3.5–5.0)
Anion gap: 8 (ref 5–15)
BUN: 17 mg/dL (ref 8–23)
CO2: 26 mmol/L (ref 22–32)
Calcium: 8.7 mg/dL — ABNORMAL LOW (ref 8.9–10.3)
Chloride: 98 mmol/L (ref 98–111)
Creatinine, Ser: 0.65 mg/dL (ref 0.61–1.24)
GFR, Estimated: >60 mL/min (ref >60)
Glucose, Bld: 104 mg/dL — ABNORMAL HIGH (ref 70–99)
Phosphorus: 3.3 mg/dL (ref 2.5–4.6)
Potassium: 3.8 mmol/L (ref 3.5–5.1)
Sodium: 132 mmol/L — ABNORMAL LOW (ref 135–145)

## 2021-10-31 LAB — CBC
HCT: 34.7 % — ABNORMAL LOW (ref 39.0–52.0)
Hemoglobin: 12.3 g/dL — ABNORMAL LOW (ref 13.0–17.0)
MCH: 32.5 pg (ref 26.0–34.0)
MCHC: 35.4 g/dL (ref 30.0–36.0)
MCV: 91.8 fL (ref 80.0–100.0)
Platelets: 235 10*3/uL (ref 150–400)
RBC: 3.78 MIL/uL — ABNORMAL LOW (ref 4.22–5.81)
RDW: 12.2 % (ref 11.5–15.5)
WBC: 5.3 10*3/uL (ref 4.0–10.5)
nRBC: 0 % (ref 0.0–0.2)

## 2021-10-31 LAB — MAGNESIUM: Magnesium: 1.8 mg/dL (ref 1.7–2.4)

## 2021-10-31 MED ORDER — HYDRALAZINE HCL 20 MG/ML IJ SOLN
10.0000 mg | Freq: Four times a day (QID) | INTRAMUSCULAR | Status: DC | PRN
  Administered 2021-10-31: 10 mg via INTRAVENOUS
  Filled 2021-10-31: qty 1

## 2021-10-31 MED ORDER — CEFADROXIL 500 MG PO CAPS: 500.0000 mg | ORAL_CAPSULE | Freq: Two times a day (BID) | ORAL | 0 refills | Status: AC

## 2021-10-31 MED ORDER — HYDROCODONE-ACETAMINOPHEN 5-325 MG PO TABS: 0.5000 | ORAL_TABLET | Freq: Three times a day (TID) | ORAL | 0 refills | Status: DC | PRN

## 2021-10-31 MED ORDER — METHOCARBAMOL 500 MG PO TABS
500.0000 mg | ORAL_TABLET | Freq: Three times a day (TID) | ORAL | 0 refills | Status: DC | PRN
Start: 2021-10-31 — End: 2021-12-23

## 2021-10-31 MED ORDER — HYDROXYZINE HCL 25 MG PO TABS
25.0000 mg | ORAL_TABLET | Freq: Once | ORAL | Status: AC
  Administered 2021-10-31: 25 mg via ORAL
  Filled 2021-10-31: qty 1

## 2021-10-31 MED ORDER — ONDANSETRON HCL 4 MG PO TABS
4.0000 mg | ORAL_TABLET | Freq: Four times a day (QID) | ORAL | 0 refills | Status: DC | PRN
Start: 2021-10-31 — End: 2023-09-20

## 2021-10-31 NOTE — Discharge Summary (Signed)
Physician Discharge Summary  [name] PYP:[id number] DOB: [birth year] DOA: 10/28/2021  PCP: Lorrene Reid, PA-C  Admit date: 10/28/2021 Discharge date: 10/31/2021  Admitted From: Home Disposition:  Home  Recommendations for Outpatient Follow-up:  Follow up with PCP in 1 week Follow up with Wound Care team 11/01/21. Please obtain BMP/CBC in 1 week to follow up on hyponatremia and normocytic anemia respectively.  Please follow up on the following pending results: Aerobic wound culture taken on 7/1  Home Health: No Equipment/Devices: None  Discharge Condition: Good CODE STATUS: DNR Diet recommendation: General  Brief/Interim Summary: From H&P: From Dr. Vance Gather: "[name] is an 88 y.o. male with a history of HTN, HLD, asthma, rectal CA s/p resection remotely, glaucoma who presented to the ED on the advice of his PCP for worsening pain related to right leg wound. Six days ago he was trampled by a cow resulting in this wound that he cleaned with soap and water and ultimately presented to his PCP for yesterday. Expedited wound care follow up was arranged, ceftriaxone 1g given IM and doxycycline started. He returned for follow up today where the wound looked worse and he was sent over. He denies fever, but noted there's been some discharge and worsening swelling of the right leg and extending redness. In the ED he began complaining of right chest pain worse with deep inspiration but was in no respiratory distress without hypoxia. CT chest/abd/pelvis demonstrated new right 3rd-7th rib fractures. Sodium noted to be 126.    He was hemodynamically stable without evidence of sepsis, though cellulitis was severe and had failed outpatient management, so was given vancomycin and normal saline bolus after blood cultures drawn. Hospitalists and general surgery consulted, determined medicine admission with surgical consult appropriate and will be treated for infected wound, cellulitis, rib  fractures, and hyponatremia."  Interim: Steadily improved w antibiotics.  De-escalated to Rocephin on 7/1 where cultures were also taken from his wound.  Subjective on day of discharge: Patient reports some abdominal pain after eating, resolved with antinausea medication.  Not having any pain in the lower extremity.  Denies any fevers or drainage.  He is ready to go home.  Discharge Diagnoses:  Principal Problem:   Cellulitis of right lower extremity Active Problems:   Essential hypertension   Chronic asthma, moderate persistent, uncomplicated   Psoriasiform dermatitis   Glaucoma   Hyponatremia   Aortic atherosclerosis (HCC)   History of coronary artery disease   History of CVA (cerebrovascular accident)   History of rectal cancer   Hyperbilirubinemia   Struck by cow   DNR (do not resuscitate)   First degree AV block   Hiatal hernia   Abdominal pain   Constipation  Rocephin was transitioned to [facility] for 6 total days.  He was given a short supply of hydrocodone to help with his rib fractures.  Recommended to avoid being trampled by a cow.  He will continue his home medication.  He has a follow-up appoint with the wound care team tomorrow.  Discharge Instructions  Discharge Instructions     Call MD for:  redness, tenderness, or signs of infection (pain, swelling, redness, odor or green/yellow discharge around incision site)   Complete by: As directed    Call MD for:  temperature >100.4   Complete by: As directed    Diet - low sodium heart healthy   Complete by: As directed    Increase activity slowly   Complete by: As directed  Leave dressing on - Keep it clean, dry, and intact until clinic visit   Complete by: As directed       Allergies as of 10/31/2021   No Known Allergies      Medication List     STOP taking these medications    betamethasone dipropionate 0.05 % cream   doxycycline 100 MG tablet Commonly known as: VIBRA-TABS   mupirocin ointment 2  % Commonly known as: BACTROBAN       TAKE these medications    acitretin 25 MG capsule Commonly known as: SORIATANE Take 25 mg by mouth daily.   albuterol 108 (90 Base) MCG/ACT inhaler Commonly known as: ProAir HFA 2 puffs every 4 hours as needed only  if your can't catch your breath What changed:  how much to take how to take this when to take this reasons to take this additional instructions   amLODipine 5 MG tablet Commonly known as: NORVASC TAKE 1 TABLET (5 MG TOTAL) BY MOUTH DAILY.   budesonide-formoterol 160-4.5 MCG/ACT inhaler Commonly known as: Symbicort Take 2 puffs first thing in am and then another 2 puffs about 12 hours later. What changed:  how much to take how to take this when to take this additional instructions   cefadroxil 500 MG capsule Commonly known as: DURICEF Take 1 capsule (500 mg total) by mouth 2 (two) times daily for 6 days. Start taking on: November 01, 2021   HYDROcodone-acetaminophen 5-325 MG tablet Commonly known as: Norco Take 0.5-1 tablets by mouth every 8 (eight) hours as needed for moderate pain.   latanoprost 0.005 % ophthalmic solution Commonly known as: XALATAN Place 1 drop into both eyes at bedtime.   methocarbamol 500 MG tablet Commonly known as: ROBAXIN Take 1 tablet (500 mg total) by mouth every 8 (eight) hours as needed for muscle spasms.   ondansetron 4 MG tablet Commonly known as: ZOFRAN Take 1 tablet (4 mg total) by mouth every 6 (six) hours as needed for nausea.   valsartan-hydrochlorothiazide 320-12.5 MG tablet Commonly known as: DIOVAN-HCT Take 1 tablet by mouth daily.               Discharge Care Instructions  (From admission, onward)           Start     Ordered   10/31/21 0000  Leave dressing on - Keep it clean, dry, and intact until clinic visit        10/31/21 0856            No Known Allergies  Consultations: General surgery   Procedures/Studies: VAS [place] ABI WITH/WO  TBI  Result Date: 10/30/2021  LOWER EXTREMITY DOPPLER STUDY Patient Name:  [name]  Date of Exam:   10/29/2021 Medical Rec #: [id number]        Accession #:    [id number] Date of Birth: 06-24-1930       Patient Gender: M Patient Age:   88 years Exam Location:  [facility] Procedure:      VAS [place] ABI WITH/WO TBI Referring Phys: Vance Gather --------------------------------------------------------------------------------  Indications: Injury/wound to RLE - PT knocked over and stepped on by cow a week              ago with increased redness/swelling/warmth/pain to injury site. High Risk Factors: Hypertension, hyperlipidemia, no history of smoking.  Comparison Study: No previous exams Performing Technologist: Hill, Jody RVT, RDMS  Examination Guidelines: A complete evaluation includes at minimum, Doppler waveform signals and systolic  blood pressure reading at the level of bilateral brachial, anterior tibial, and posterior tibial arteries, when vessel segments are accessible. Bilateral testing is considered an integral part of a complete examination. Photoelectric Plethysmograph (PPG) waveforms and toe systolic pressure readings are included as required and additional duplex testing as needed. Limited examinations for reoccurring indications may be performed as noted.  ABI Findings: +---------+------------------+-----+---------+--------+ Right    Rt Pressure (mmHg)IndexWaveform Comment  +---------+------------------+-----+---------+--------+ Brachial 144                    triphasic         +---------+------------------+-----+---------+--------+ PTA      197               1.37 triphasic         +---------+------------------+-----+---------+--------+ DP       168               1.17 triphasic         +---------+------------------+-----+---------+--------+ Great Toe115               0.80 Normal            +---------+------------------+-----+---------+--------+  +---------+------------------+-----+---------+-------+ Left     Lt Pressure (mmHg)IndexWaveform Comment +---------+------------------+-----+---------+-------+ Brachial 140                    triphasic        +---------+------------------+-----+---------+-------+ PTA      182               1.26 triphasic        +---------+------------------+-----+---------+-------+ DP       165               1.15 biphasic         +---------+------------------+-----+---------+-------+ Adair Patter               0.92 Normal           +---------+------------------+-----+---------+-------+ +-------+-----------+-----------+------------+------------+ ABI/TBIToday's ABIToday's TBIPrevious ABIPrevious TBI +-------+-----------+-----------+------------+------------+ Right  1.37       0.80                                +-------+-----------+-----------+------------+------------+ Left   1.26       0.92                                +-------+-----------+-----------+------------+------------+  Summary: Right: Resting right ankle-brachial index indicates noncompressible right lower extremity arteries. The right toe-brachial index is normal. Left: Resting left ankle-brachial index is within normal range. No evidence of significant left lower extremity arterial disease. The left toe-brachial index is normal. *See table(s) above for measurements and observations.  Electronically signed by Jamelle Haring on 10/30/2021 at 9:43:51 AM.    Final    ECHOCARDIOGRAM COMPLETE  Result Date: 10/29/2021    ECHOCARDIOGRAM REPORT   Patient Name:   [name] Date of Exam: 10/29/2021 Medical Rec #:  [id number]       Height:       68.0 in Accession #:    [id number]      Weight:       142.0 lb Date of Birth:  1930-07-31      BSA:          1.767 m Patient Age:    60 years        BP:  157/81 mmHg Patient Gender: M               HR:           88 bpm. Exam Location:  Inpatient Procedure: 2D Echo, Cardiac  Doppler and Color Doppler Indications:    Murmur  History:        Patient has no prior history of Echocardiogram examinations.                 Risk Factors:Hypertension and HLD.  Sonographer:    Joette Catching RCS Referring Phys: [place]  1. Left ventricular ejection fraction, by estimation, is 60 to 65%. The left ventricle has normal function. The left ventricle has no regional wall motion abnormalities. Left ventricular diastolic parameters were normal.  2. Right ventricular systolic function is normal. The right ventricular size is normal.  3. The mitral valve is abnormal. Mild mitral valve regurgitation. No evidence of mitral stenosis.  4. The aortic valve is tricuspid. There is mild calcification of the aortic valve. Aortic valve regurgitation is trivial. Aortic valve sclerosis is present, with no evidence of aortic valve stenosis.  5. The inferior vena cava is normal in size with greater than 50% respiratory variability, suggesting right atrial pressure of 3 mmHg. FINDINGS  Left Ventricle: Left ventricular ejection fraction, by estimation, is 60 to 65%. The left ventricle has normal function. The left ventricle has no regional wall motion abnormalities. The left ventricular internal cavity size was normal in size. There is  no left ventricular hypertrophy. Left ventricular diastolic parameters were normal. Right Ventricle: The right ventricular size is normal. No increase in right ventricular wall thickness. Right ventricular systolic function is normal. Left Atrium: Left atrial size was normal in size. Right Atrium: Right atrial size was normal in size. Pericardium: There is no evidence of pericardial effusion. Mitral Valve: The mitral valve is abnormal. There is mild thickening of the mitral valve leaflet(s). Mild mitral valve regurgitation. No evidence of mitral valve stenosis. Tricuspid Valve: The tricuspid valve is normal in structure. Tricuspid valve regurgitation is mild . No  evidence of tricuspid stenosis. Aortic Valve: The aortic valve is tricuspid. There is mild calcification of the aortic valve. Aortic valve regurgitation is trivial. Aortic regurgitation PHT measures 365 msec. Aortic valve sclerosis is present, with no evidence of aortic valve stenosis.  Aortic valve mean gradient measures 7.0 mmHg. Aortic valve peak gradient measures 13.5 mmHg. Aortic valve area, by VTI measures 2.61 cm. Pulmonic Valve: The pulmonic valve was normal in structure. Pulmonic valve regurgitation is not visualized. No evidence of pulmonic stenosis. Aorta: The aortic root is normal in size and structure. Venous: The inferior vena cava is normal in size with greater than 50% respiratory variability, suggesting right atrial pressure of 3 mmHg. IAS/Shunts: No atrial level shunt detected by color flow Doppler.  LEFT VENTRICLE PLAX 2D LVIDd:         3.50 cm   Diastology LVIDs:         2.30 cm   LV e' medial:    4.68 cm/s LV PW:         1.00 cm   LV E/e' medial:  18.1 LV IVS:        1.00 cm   LV e' lateral:   7.29 cm/s LVOT diam:     2.10 cm   LV E/e' lateral: 11.6 LV SV:         81 LV SV Index:   46 LVOT  Area:     3.46 cm  RIGHT VENTRICLE             IVC RV Basal diam:  3.70 cm     IVC diam: 1.30 cm RV Mid diam:    3.30 cm RV S prime:     15.30 cm/s TAPSE (M-mode): 1.4 cm LEFT ATRIUM           Index        RIGHT ATRIUM           Index LA diam:      3.10 cm 1.75 cm/m   RA Area:     13.00 cm LA Vol (A2C): 21.2 ml 12.00 ml/m  RA Volume:   29.90 ml  16.92 ml/m LA Vol (A4C): 40.9 ml 23.15 ml/m  AORTIC VALVE                     PULMONIC VALVE AV Area (Vmax):    2.57 cm      PV Vmax:       1.63 m/s AV Area (Vmean):   2.53 cm      PV Peak grad:  10.6 mmHg AV Area (VTI):     2.61 cm AV Vmax:           183.50 cm/s AV Vmean:          120.500 cm/s AV VTI:            0.310 m AV Peak Grad:      13.5 mmHg AV Mean Grad:      7.0 mmHg LVOT Vmax:         136.00 cm/s LVOT Vmean:        87.900 cm/s LVOT VTI:           0.234 m LVOT/AV VTI ratio: 0.75 AI PHT:            365 msec  AORTA Ao Root diam: 3.80 cm Ao Asc diam:  3.30 cm MITRAL VALVE                TRICUSPID VALVE MV Area (PHT): 10.25 cm    TR Peak grad:   39.7 mmHg MV Decel Time: 74 msec      TR Vmax:        315.00 cm/s MV E velocity: 84.60 cm/s MV A velocity: 135.00 cm/s  SHUNTS MV E/A ratio:  0.63         Systemic VTI:  0.23 m                             Systemic Diam: 2.10 cm Jenkins Rouge MD Electronically signed by Jenkins Rouge MD Signature Date/Time: 10/29/2021/9:01:38 AM    Final    DG FEMUR PORT MIN 2 VIEWS LEFT  Result Date: 10/28/2021 CLINICAL DATA:  Struck by a cow. EXAM: LEFT FEMUR PORTABLE 2 VIEWS COMPARISON:  None Available. FINDINGS: There is no evidence of fracture or other focal bone lesions. Soft tissues are unremarkable. IMPRESSION: Negative. Electronically Signed   By: Fidela Salisbury M.D.   On: 10/28/2021 17:15   DG FEMUR PORT, MIN 2 VIEWS RIGHT  Result Date: 10/28/2021 CLINICAL DATA:  Wound, knocked down by CT. EXAM: RIGHT FEMUR PORTABLE 2 VIEW COMPARISON:  None Available. FINDINGS: There is no evidence of fracture or other focal bone lesions. Soft tissues are unremarkable. There is residual contrast in the bladder. Surgical clips are seen in the pelvis.  IMPRESSION: No acute fracture or dislocation. Electronically Signed   By: Ronney Asters M.D.   On: 10/28/2021 17:13   CT CHEST ABDOMEN PELVIS W CONTRAST  Result Date: 10/28/2021 CLINICAL DATA:  Trampled by cow 6 days ago, right rib pain and open wounds. EXAM: CT CHEST, ABDOMEN, AND PELVIS WITH CONTRAST TECHNIQUE: Multidetector CT imaging of the chest, abdomen and pelvis was performed following the standard protocol during bolus administration of intravenous contrast. RADIATION DOSE REDUCTION: This exam was performed according to the departmental dose-optimization program which includes automated exposure control, adjustment of the mA and/or kV according to patient size and/or use of  iterative reconstruction technique. CONTRAST:  141m OMNIPAQUE IOHEXOL 300 MG/ML  SOLN COMPARISON:  Chest radiograph 08/10/2017 FINDINGS: CT CHEST FINDINGS Cardiovascular: Coronary, aortic arch, and branch vessel atherosclerotic vascular disease. Borderline cardiomegaly. Mediastinum/Nodes: Large type 3 hiatal hernia. Lungs/Pleura: No pneumothorax or pulmonary contusion. Mild atelectasis or scarring in the lingula. Musculoskeletal: Nondisplaced acute fractures the right anterolateral third and fourth ribs noted with displaced acute anterolateral right fifth and sixth rib fractures. Probable nondisplaced right seventh rib fracture laterally. Possibly old right lateral eighth rib fracture. Old left posterior tenth and eleventh rib fractures noted. CT ABDOMEN PELVIS FINDINGS Hepatobiliary: Unremarkable Pancreas: Unremarkable Spleen: Unremarkable Adrenals/Urinary Tract: Both adrenal glands appear normal. Benign cysts of the right kidney noted including a right kidney upper pole cyst with faint marginal in septal calcification. These lesions do not require any further workup. Stomach/Bowel: Large type 3 hiatal hernia. Colonic diverticulosis notably in the ascending colon. No appendiceal inflammation but there are small diverticula of the appendix noted. Anastomotic staple line in the rectum with presacral soft tissue density which is probably related to therapy from prior regional tumor, correlate with patient history. Vascular/Lymphatic: Atherosclerosis is present, including aortoiliac atherosclerotic disease. No pathologic adenopathy. Reproductive: Unremarkable Other: No supplemental non-categorized findings. Musculoskeletal: Mild lumbar spondylosis and degenerative disc disease. A small ventral midline Richter hernia contains a margin of the transverse colon on image 122 series 5. Just below this is a separate small hernia containing adipose tissue. IMPRESSION: 1. Acute fractures of the right third, fourth, fifth,  sixth, and seventh ribs. The fifth and sixth rib fractures are mildly displaced. No pneumothorax or pulmonary contusion. No other acute traumatic findings. 2. Old right lateral eighth rib fracture and old left posterior tenth and eleventh rib fractures. 3. There is a small ventral midline Richter hernia containing a margin of the transverse colon without findings of strangulation at this time. 4. Other imaging findings of potential clinical significance: Aortic Atherosclerosis (ICD10-I70.0). Coronary atherosclerosis. Borderline cardiomegaly. Large type 3 hiatal hernia. Colonic diverticulosis including diverticula of the appendix. Electronically Signed   By: WVan ClinesM.D.   On: 10/28/2021 14:14   CT HEAD WO CONTRAST (5MM)  Result Date: 10/28/2021 CLINICAL DATA:  Trampled by cow 6 days ago. Open wounds. Right rib pain. EXAM: CT HEAD WITHOUT CONTRAST TECHNIQUE: Contiguous axial images were obtained from the base of the skull through the vertex without intravenous contrast. RADIATION DOSE REDUCTION: This exam was performed according to the departmental dose-optimization program which includes automated exposure control, adjustment of the mA and/or kV according to patient size and/or use of iterative reconstruction technique. COMPARISON:  Report from 10/11/1998 FINDINGS: Brain: The brainstem, cerebellum, cerebral peduncles, thalami, basal ganglia, basilar cisterns, and ventricular system appear within normal limits. Periventricular white matter and corona radiata hypodensities favor chronic ischemic microvascular white matter disease. Age-appropriate cerebral atrophy. No intracranial hemorrhage, mass lesion, or acute  CVA. Vascular: There is atherosclerotic calcification of the cavernous carotid arteries bilaterally. Skull: Unremarkable Sinuses/Orbits: Mild chronic ethmoid sinusitis. Other: No supplemental non-categorized findings. IMPRESSION: 1. No acute intracranial findings. 2. Periventricular white  matter and corona radiata hypodensities favor chronic ischemic microvascular white matter disease. 3. Mild chronic ethmoid sinusitis. 4. Atherosclerosis. Electronically Signed   By: Van Clines M.D.   On: 10/28/2021 13:42   DG Tibia/Fibula Right  Result Date: 10/28/2021 CLINICAL DATA:  Wound EXAM: RIGHT TIBIA AND FIBULA - 2 VIEW COMPARISON:  None Available. FINDINGS: Alignment is anatomic. No acute fracture. No cortical erosion or periosteal reaction. No soft tissue air. IMPRESSION: Negative. Electronically Signed   By: Macy Mis M.D.   On: 10/28/2021 12:12     Discharge Exam: Vitals:   10/31/21 0744 10/31/21 0837  BP:  (!) 122/54  Pulse:    Resp:    Temp:    SpO2: 96%     General: Pt is alert, awake, not in acute distress Cardiovascular: RRR, S1/S2 +, 2+ getting bilateral lower extremity edema tapering at the mid tibia Respiratory: CTA bilaterally, no wheezing, no rhonchi, no respiratory distress, no conversational dyspnea  Abdominal: Soft, NT, ND, bowel sounds + Extremities: Right lower extremity wound with excoriation and some granulation tissue after removing the dressing.  There is a slight border of erythema with some warmth and mild TTP.  There is no fluctuance or overt drainage Psych: Normal mood and affect, stable judgement and insight     The results of significant diagnostics from this hospitalization (including imaging, microbiology, ancillary and laboratory) are listed below for reference.     Microbiology: Recent Results (from the past 240 hour(s))  Wound culture     Status: Abnormal   Collection Time: 10/27/21 10:03 AM   Specimen: Wound   Wound  Result Value Ref Range Status   Gram Stain Result Final report  Final   Organism ID, Bacteria Comment  Final    Comment: No white blood cells seen.   Organism ID, Bacteria No organisms seen  Final   Aerobic Bacterial Culture Final report (A)  Final   Organism ID, Bacteria Staphylococcus aureus (A)  Final     Comment: Based on susceptibility to oxacillin this isolate would be susceptible to: *Penicillinase-stable penicillins, such as:   Cloxacillin, Dicloxacillin, Nafcillin *Beta-lactam combination agents, such as:   Amoxicillin-clavulanic acid, Ampicillin-sulbactam,   Piperacillin-tazobactam *Oral cephems, such as:   Cefaclor, Cefdinir, Cefpodoxime, Cefprozil, Cefuroxime,   Cephalexin, Loracarbef *Parenteral cephems, such as:   Cefazolin, Cefepime, Cefotaxime, Cefotetan, Ceftaroline,   Ceftizoxime, Ceftriaxone, Cefuroxime *Carbapenems, such as:   Doripenem, Ertapenem, Imipenem, Meropenem Most isolates of Staphylococcus sp. produce a beta-lactamase enzyme rendering them resistant to penicillin. Please contact the laboratory if penicillin is being considered for therapy. Light growth    Organism ID, Bacteria Mixed skin flora  Final    Comment: Light growth   Antimicrobial Susceptibility Comment  Final    Comment:       ** S = Susceptible; I = Intermediate; R = Resistant **                    P = Positive; N = Negative             MICS are expressed in micrograms per mL    Antibiotic                 RSLT#1    RSLT#2    RSLT#3    RSLT#4  Ciprofloxacin                  S Clindamycin                    S Erythromycin                   S Gentamicin                     S Levofloxacin                   S Linezolid                      S Moxifloxacin                   S Oxacillin                      S Quinupristin/Dalfopristin      S Rifampin                       S Tetracycline                   S Trimethoprim/Sulfa             S Vancomycin                     S   Blood culture (routine x 2)     Status: None (Preliminary result)   Collection Time: 10/28/21 12:19 PM   Specimen: BLOOD  Result Value Ref Range Status   Specimen Description   Final    BLOOD RIGHT ANTECUBITAL Performed at [facility] [address] [place], [place] [address]    Special Requests    Final    BOTTLES DRAWN AEROBIC AND ANAEROBIC Blood Culture results may not be optimal due to an excessive volume of blood received in culture bottles Performed at [facility] [address] [place], [place] [address]    Culture   Final    NO GROWTH 3 DAYS Performed at [facility], [place] [address], [place], [place] [address]    Report Status PENDING  Incomplete  Blood culture (routine x 2)     Status: None (Preliminary result)   Collection Time: 10/28/21 12:19 PM   Specimen: BLOOD  Result Value Ref Range Status   Specimen Description   Final    BLOOD LEFT ANTECUBITAL Performed at [facility] [address], [place], [place] [address]    Special Requests   Final    BOTTLES DRAWN AEROBIC AND ANAEROBIC Blood Culture adequate volume Performed at [facility] [address], [place], [place] [address]    Culture   Final    NO GROWTH 3 DAYS Performed at [facility], [place] [address], [place],  [address]    Report Status PENDING  Incomplete     Labs: BNP (last 3 results) No results for input(s): "BNP" in the last 8760 hours. Basic Metabolic Panel: Recent Labs  Lab 10/28/21 1219 10/28/21 1844 10/29/21 0546 10/30/21 0716 10/31/21 0638  NA 126* 125* 127* 132* 132*  K 3.9 4.2 4.7 4.1 3.8  CL 88* 91* 93* 99 98  CO2 '25 23 26 25 26  '$ GLUCOSE 124* 125* 114* 105* 104*  BUN '13 12 18 16 '$ 17  CREATININE 0.76 0.81 0.81 0.71 0.65  CALCIUM 9.4 8.9 8.9 8.7* 8.7*  MG  --   --   --  1.8 1.8  PHOS  --   --   --  4.3 3.3   Liver Function Tests: Recent Labs  Lab 10/28/21 1219 10/29/21 0546 10/30/21 0716 10/31/21 0638  AST '29 22 17  '$ --   ALT '25 18 16  '$ --   ALKPHOS 78 59 48  --   BILITOT 1.3* 1.0 1.0  --   PROT 7.8 6.4* 5.8*  --   ALBUMIN 4.1 3.4* 2.9* 3.1*   CBC: Recent Labs  Lab 10/28/21 1219 10/29/21 0546 10/30/21 0716 10/31/21 0638  WBC 10.3 6.4 6.7 5.3  NEUTROABS 8.9*  --  5.1  --   HGB 14.6  13.0 12.0* 12.3*  HCT 40.8 37.3* 34.0* 34.7*  MCV 90.9 92.3 92.4 91.8  PLT 240 227 219 235   Cardiac Enzymes: Recent Labs  Lab 10/30/21 0716  CKTOTAL 46*   Lipid Profile Recent Labs    10/29/21 0546  CHOL 115  HDL 47  LDLCALC 59  TRIG 46  CHOLHDL 2.4   Thyroid function studies Recent Labs    10/29/21 0546  TSH 2.309   Sepsis Labs Recent Labs  Lab 10/28/21 1219 10/29/21 0546 10/30/21 0716 10/31/21 0638  WBC 10.3 6.4 6.7 5.3   Microbiology Recent Results (from the past 240 hour(s))  Wound culture     Status: Abnormal   Collection Time: 10/27/21 10:03 AM   Specimen: Wound   Wound  Result Value Ref Range Status   Gram Stain Result Final report  Final   Organism ID, Bacteria Comment  Final    Comment: No white blood cells seen.   Organism ID, Bacteria No organisms seen  Final   Aerobic Bacterial Culture Final report (A)  Final   Organism ID, Bacteria Staphylococcus aureus (A)  Final    Comment: Based on susceptibility to oxacillin this isolate would be susceptible to: *Penicillinase-stable penicillins, such as:   Cloxacillin, Dicloxacillin, Nafcillin *Beta-lactam combination agents, such as:   Amoxicillin-clavulanic acid, Ampicillin-sulbactam,   Piperacillin-tazobactam *Oral cephems, such as:   Cefaclor, Cefdinir, Cefpodoxime, Cefprozil, Cefuroxime,   Cephalexin, Loracarbef *Parenteral cephems, such as:   Cefazolin, Cefepime, Cefotaxime, Cefotetan, Ceftaroline,   Ceftizoxime, Ceftriaxone, Cefuroxime *Carbapenems, such as:   Doripenem, Ertapenem, Imipenem, Meropenem Most isolates of Staphylococcus sp. produce a beta-lactamase enzyme rendering them resistant to penicillin. Please contact the laboratory if penicillin is being considered for therapy. Light growth    Organism ID, Bacteria Mixed skin flora  Final    Comment: Light growth   Antimicrobial Susceptibility Comment  Final    Comment:       ** S = Susceptible; I = Intermediate; R = Resistant **                     P = Positive; N = Negative             MICS are expressed in micrograms per mL    Antibiotic                 RSLT#1    RSLT#2    RSLT#3    RSLT#4 Ciprofloxacin                  S Clindamycin                    S Erythromycin  S Gentamicin                     S Levofloxacin                   S Linezolid                      S Moxifloxacin                   S Oxacillin                      S Quinupristin/Dalfopristin      S Rifampin                       S Tetracycline                   S Trimethoprim/Sulfa             S Vancomycin                     S   Blood culture (routine x 2)     Status: None (Preliminary result)   Collection Time: 10/28/21 12:19 PM   Specimen: BLOOD  Result Value Ref Range Status   Specimen Description   Final    BLOOD RIGHT ANTECUBITAL Performed at [facility] [address], [place], [place] [address]    Special Requests   Final    BOTTLES DRAWN AEROBIC AND ANAEROBIC Blood Culture results may not be optimal due to an excessive volume of blood received in culture bottles Performed at [facility] [address], [place], [place] [address]    Culture   Final    NO GROWTH 3 DAYS Performed at [facility], [place] [address], [place], [place] [address]    Report Status PENDING  Incomplete  Blood culture (routine x 2)     Status: None (Preliminary result)   Collection Time: 10/28/21 12:19 PM   Specimen: BLOOD  Result Value Ref Range Status   Specimen Description   Final    BLOOD LEFT ANTECUBITAL Performed at [facility] [address] [place], [place] [address]    Special Requests   Final    BOTTLES DRAWN AEROBIC AND ANAEROBIC Blood Culture adequate volume Performed at [facility] [address], [place], [place] [address]    Culture   Final    NO GROWTH 3 DAYS Performed at [facility], [place] [address] [place], [place]  [address]    Report Status PENDING  Incomplete     Patient was seen and examined on the day of discharge and was found to be in stable condition. Time coordinating discharge: 31 minutes including assessment and coordination of care, as well as examination of the patient.   SIGNED:  Shelda Pal, DO [facility] 10/31/2021, 2:40 PM

## 2021-10-31 NOTE — Discharge Instructions (Signed)

## 2021-11-01 ENCOUNTER — Encounter: Payer: Medicare HMO | Attending: Physician Assistant | Admitting: Physician Assistant

## 2021-11-01 DIAGNOSIS — I10 Essential (primary) hypertension: Secondary | ICD-10-CM | POA: Diagnosis not present

## 2021-11-01 DIAGNOSIS — L97812 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r exposed: Secondary | ICD-10-CM | POA: Diagnosis not present

## 2021-11-01 DIAGNOSIS — W5522XA Struck by cow, initial encounter: Secondary | ICD-10-CM | POA: Diagnosis not present

## 2021-11-01 DIAGNOSIS — L03115 Cellulitis of right lower limb: Secondary | ICD-10-CM | POA: Diagnosis not present
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
## 2021-11-01 NOTE — Progress Notes (Signed)
SEM, MCCAUGHEY ([id number]) Visit Report for 11/01/2021 Chief Complaint Document Details Patient Name: [name], [name] Date of Service: 11/01/2021 9:15 AM Medical Record Number: [id number] Patient Account Number: [id number] Date of Birth/Sex: 1931/01/24 (88 y.o. M) Treating RN: Cornell Barman Primary Care Provider: Lorrene Reid Other Clinician: Referring Provider: Lorrene Reid Treating Provider/Extender: Skipper Cliche in Treatment: 0 Information Obtained from: Patient Chief Complaint Right LE Ulcer and Cellulitis Electronic Signature(s) Signed: 11/01/2021 10:04:54 AM By: Worthy Keeler PA-C Entered By: Worthy Keeler on 11/01/2021 10:04:54 [name] ([id number]) -------------------------------------------------------------------------------- Debridement Details Patient Name: [name] Date of Service: 11/01/2021 9:15 AM Medical Record Number: [id number] Patient Account Number: [id number] Date of Birth/Sex: November 16, 1930 (88 y.o. M) Treating RN: Cornell Barman Primary Care Provider: Lorrene Reid Other Clinician: Referring Provider: Lorrene Reid Treating Provider/Extender: Skipper Cliche in Treatment: 0 Debridement Performed for Wound #1 Right,Medial Lower Leg Assessment: Performed By: Physician Tommie Sams., PA-C Debridement Type: Debridement Level of Consciousness (Pre- Awake and Alert procedure): Pre-procedure Verification/Time Out Yes - 10:09 Taken: Total Area Debrided (L x W): 24 (cm) x 1.5 (cm) = 36 (cm) Tissue and other material Viable, Non-Viable, Eschar, Slough, Subcutaneous, Biofilm, Fibrin/Exudate, Slough debrided: Level: Skin/Subcutaneous Tissue Debridement Description: Excisional Instrument: Curette Bleeding: Moderate Hemostasis Achieved: Pressure Response to Treatment: Procedure was tolerated well Level of Consciousness (Post- Awake and Alert procedure): Post Debridement Measurements of Total Wound Length: (cm) 24 Width: (cm)  6 Depth: (cm) 0.2 Volume: (cm) 22.619 Character of Wound/Ulcer Post Debridement: Stable Post Procedure Diagnosis Same as Pre-procedure Electronic Signature(s) Signed: 11/01/2021 2:38:00 PM By: Gretta Cool, BSN, RN, CWS, Kim RN, BSN Signed: 11/01/2021 3:51:56 PM By: Worthy Keeler PA-C Entered By: Gretta Cool, BSN, RN, CWS, Kim on 11/01/2021 10:13:54 DEBORAH, DONDERO ([id number]) -------------------------------------------------------------------------------- HPI Details Patient Name: [name] Date of Service: 11/01/2021 9:15 AM Medical Record Number: [id number] Patient Account Number: [id number] Date of Birth/Sex: 1931/01/17 (88 y.o. M) Treating RN: Cornell Barman Primary Care Provider: Lorrene Reid Other Clinician: Referring Provider: Lorrene Reid Treating Provider/Extender: Skipper Cliche in Treatment: 0 History of Present Illness HPI Description: 11-01-2021 upon evaluation today patient actually presents for initial inspection here in the clinic of actually been involved in his care to some degree since Thursday of last week. Subsequently the patient has been seen by his primary care provider on Wednesday and Thursday of last week they were start him on Rocephin to try to get infection under control but he still had significant issues with a wound on his right leg where he was trampled by his cow 5 days prior to that point. He tells me that the health went down his leg and subsequently she also stepped on him in the belly/abdomen area. With that being said the patient tells me that he did not go see anyone for 5 days because he was "just being stubborn". Subsequently he ended up eventually going to the doctor and being seen that is when I was contacted and suggested that he really need to go to the ER for further evaluation and treatment. He ended up doing so. They had him on IV antibiotics in the hospital and discharged him on cefadroxil 500 mg 2 times a day for 6 days. He starts that  today. With that being said overall he really seems to be doing quite well in my opinion. The good news is they also did do an ABI while he was in the hospital this showed that he reading on the right  of 1.37 with a TBI of 0.80 and on the left 1.26 with a TBI of 0.92. Medihoney was initiated as treatment and that has helped to clean up the wound quite significantly in the interim since I saw the first picture that was taken on Thursday of last week that is in epic. Fortunately the patient really has no other major medical problems other than the fact that he does have some issues here with leg swelling this is 1+ pitting but otherwise he is doing quite well in my opinion. Electronic Signature(s) Signed: 11/01/2021 11:48:54 AM By: Worthy Keeler PA-C Entered By: Worthy Keeler on 11/01/2021 11:48:54 PIETRO, BONURA ([id number]) -------------------------------------------------------------------------------- Physical Exam Details Patient Name: [name] Date of Service: 11/01/2021 9:15 AM Medical Record Number: [id number] Patient Account Number: [id number] Date of Birth/Sex: 08/31/1930 (88 y.o. M) Treating RN: Cornell Barman Primary Care Provider: Lorrene Reid Other Clinician: Referring Provider: Lorrene Reid Treating Provider/Extender: Skipper Cliche in Treatment: 0 Constitutional patient is hypertensive.. pulse regular and within target range for patient.Marland Kitchen respirations regular, non-labored and within target range for patient.Marland Kitchen temperature within target range for patient.. Well-nourished and well-hydrated in no acute distress. Eyes conjunctiva clear no eyelid edema noted. pupils equal round and reactive to light and accommodation. Ears, Nose, Mouth, and Throat no gross abnormality of ear auricles or external auditory canals. normal hearing noted during conversation. mucus membranes moist. Respiratory normal breathing without difficulty. Cardiovascular 2+ dorsalis  pedis/posterior tibialis pulses. 1+ pitting edema of the bilateral lower extremities. Musculoskeletal normal gait and posture. no significant deformity or arthritic changes, no loss or range of motion, no clubbing. Psychiatric this patient is able to make decisions and demonstrates good insight into disease process. Alert and Oriented x 3. pleasant and cooperative. Notes Upon inspection patient's wound bed actually showed signs of good granulation and epithelization at this point. Fortunately I do not see any signs of infection locally or systemically which is great news. Overall I think that he is making excellent progress here and I am very pleased in that regard. I do think the Medihoney was of great benefit for him over the past week. Subsequently I do believe Hydrofera Blue would do well especially underneath the wrap at this time. He is in agreement with that plan. We discussed treatment options going forward and how to get this closed. Electronic Signature(s) Signed: 11/01/2021 11:49:31 AM By: Worthy Keeler PA-C Entered By: Worthy Keeler on 11/01/2021 11:49:31 RACHARD, ISIDRO ([id number]) -------------------------------------------------------------------------------- Physician Orders Details Patient Name: [name] Date of Service: 11/01/2021 9:15 AM Medical Record Number: [id number] Patient Account Number: [id number] Date of Birth/Sex: 1930-10-22 (88 y.o. M) Treating RN: Cornell Barman Primary Care Provider: Lorrene Reid Other Clinician: Referring Provider: Lorrene Reid Treating Provider/Extender: Skipper Cliche in Treatment: 0 Verbal / Phone Orders: No Diagnosis Coding ICD-10 Coding Code Description W55.43XA Struck by cow, initial encounter [phone] 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r exposed L03.115 Cellulitis of right lower limb Follow-up Appointments Wound #1 Right,Medial Lower Leg o Return Appointment in 1  week. o Nurse Visit as needed - Nurse visit Wed or Thurs. Bathing/ Shower/ Hygiene o May shower with wound dressing protected with water repellent cover or cast protector. Anesthetic (Use 'Patient Medications' Section for Anesthetic Order Entry) o Lidocaine applied to wound bed Edema Control - Lymphedema / Segmental Compressive Device / Other o Elevate, Exercise Daily and Avoid Standing for Long Periods of Time. o Elevate legs  to the level of the heart and pump ankles as often as possible o Elevate leg(s) parallel to the floor when sitting. Additional Orders / Instructions o Follow Nutritious Diet and Increase Protein Intake Medications-Please add to medication list. o P.O. Antibiotics Wound Treatment Wound #1 - Lower Leg Wound Laterality: Right, Medial Cleanser: Soap and Water Discharge Instructions: Gently cleanse wound with antibacterial soap, rinse and pat dry prior to dressing wounds Cleanser: Wound Cleanser Discharge Instructions: Wash your hands with soap and water. Remove old dressing, discard into plastic bag and place into trash. Cleanse the wound with Wound Cleanser prior to applying a clean dressing using gauze sponges, not tissues or cotton balls. Do not scrub or use excessive force. Pat dry using gauze sponges, not tissue or cotton balls. Primary Dressing: Hydrofera Blue Ready Transfer Foam, 4x5 (in/in) Discharge Instructions: Apply Hydrofera Blue Ready to wound bed as directed Secondary Dressing: Zetuvit Plus 4x8 (in/in) Compression Wrap: 3-LAYER WRAP - Profore Lite LF 3 Multilayer Compression Bandaging System Discharge Instructions: Apply 3 multi-layer wrap as prescribed. Electronic Signature(s) Signed: 11/01/2021 2:38:00 PM By: Gretta Cool, BSN, RN, CWS, Kim RN, BSN Signed: 11/01/2021 3:51:56 PM By: Worthy Keeler PA-C Entered By: Gretta Cool BSN, RN, CWS, Kim on 11/01/2021 10:19:56 VERLIN, UHER ([id number]) TALIK, CASIQUE  ([id number]) -------------------------------------------------------------------------------- Problem List Details Patient Name: [name] Date of Service: 11/01/2021 9:15 AM Medical Record Number: [id number] Patient Account Number: [id number] Date of Birth/Sex: 1930/12/22 (88 y.o. M) Treating RN: Cornell Barman Primary Care Provider: Lorrene Reid Other Clinician: Referring Provider: Lorrene Reid Treating Provider/Extender: Skipper Cliche in Treatment: 0 Active Problems ICD-10 Encounter Code Description Active Date MDM Diagnosis W55.22XA Struck by cow, initial encounter 11/01/2021 No Yes L97.812 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r 11/01/2021 No Yes exposed L03.115 Cellulitis of right lower limb 11/01/2021 No Yes Inactive Problems Resolved Problems Electronic Signature(s) Signed: 11/01/2021 10:04:38 AM By: Worthy Keeler PA-C Entered By: Worthy Keeler on 11/01/2021 10:04:37 [name] ([id number]) -------------------------------------------------------------------------------- Progress Note Details Patient Name: [name] Date of Service: 11/01/2021 9:15 AM Medical Record Number: [id number] Patient Account Number: [id number] Date of Birth/Sex: 1930/07/23 (88 y.o. M) Treating RN: Cornell Barman Primary Care Provider: Lorrene Reid Other Clinician: Referring Provider: Lorrene Reid Treating Provider/Extender: Skipper Cliche in Treatment: 0 Subjective Chief Complaint Information obtained from Patient Right LE Ulcer and Cellulitis History of Present Illness (HPI) 11-01-2021 upon evaluation today patient actually presents for initial inspection here in the clinic of actually been involved in his care to some degree since Thursday of last week. Subsequently the patient has been seen by his primary care provider on Wednesday and Thursday of last week they were start him on Rocephin to try to get infection under control but he  still had significant issues with a wound on his right leg where he was trampled by his cow 5 days prior to that point. He tells me that the health went down his leg and subsequently she also stepped on him in the belly/abdomen area. With that being said the patient tells me that he did not go see anyone for 5 days because he was "just being stubborn". Subsequently he ended up eventually going to the doctor and being seen that is when I was contacted and suggested that he really need to go to the ER for further evaluation and treatment. He ended up doing so. They had him on IV antibiotics in the hospital and discharged  him on cefadroxil 500 mg 2 times a day for 6 days. He starts that today. With that being said overall he really seems to be doing quite well in my opinion. The good news is they also did do an ABI while he was in the hospital this showed that he reading on the right of 1.37 with a TBI of 0.80 and on the left 1.26 with a TBI of 0.92. Medihoney was initiated as treatment and that has helped to clean up the wound quite significantly in the interim since I saw the first picture that was taken on Thursday of last week that is in epic. Fortunately the patient really has no other major medical problems other than the fact that he does have some issues here with leg swelling this is 1+ pitting but otherwise he is doing quite well in my opinion. Patient History Allergies No Known Drug Allergies Social History Never smoker, Marital Status - Widowed, Alcohol Use - Daily, Drug Use - No History, Caffeine Use - Never. Medical History Eyes Patient has history of Cataracts, Glaucoma Respiratory Patient has history of Asthma Cardiovascular Patient has history of Hypertension Endocrine Denies history of Type I Diabetes, Type II Diabetes Oncologic Patient has history of Received Chemotherapy, Received Radiation Medical And Surgical History Notes Oncologic colon (2000), skin-hand and eye  (2013) Review of Systems (ROS) Constitutional Symptoms ([place]) Denies complaints or symptoms of Fatigue, Fever, Chills, Marked Weight Change. Ear/Nose/Mouth/Throat Denies complaints or symptoms of Difficult clearing ears, Sinusitis. Hematologic/Lymphatic Denies complaints or symptoms of Bleeding / Clotting Disorders, Human Immunodeficiency Virus. Cardiovascular Complains or has symptoms of LE edema. Gastrointestinal Denies complaints or symptoms of Frequent diarrhea, Nausea, Vomiting. Endocrine Denies complaints or symptoms of Hepatitis, Thyroid disease, Polydypsia (Excessive Thirst). Genitourinary Denies complaints or symptoms of Kidney failure/ Dialysis, Incontinence/dribbling. Immunological Denies complaints or symptoms of Hives, Itching. Integumentary (Skin) Complains or has symptoms of Wounds, Bleeding or bruising tendency. Musculoskeletal Denies complaints or symptoms of Muscle Pain, Muscle Weakness. MINNIE, SHI ([id number]) Neurologic Denies complaints or symptoms of Numbness/parasthesias, Focal/Weakness. Psychiatric Denies complaints or symptoms of Anxiety, Claustrophobia. Objective Constitutional patient is hypertensive.. pulse regular and within target range for patient.Marland Kitchen respirations regular, non-labored and within target range for patient.Marland Kitchen temperature within target range for patient.. Well-nourished and well-hydrated in no acute distress. Vitals Time Taken: 9:30 AM, Height: 68 in, Weight: 130 lbs, BMI: 19.8, Temperature: 97.5 F, Pulse: 66 bpm, Respiratory Rate: 16 breaths/min, Blood Pressure: 151/80 mmHg. General Notes: Patient states he just took his BP medication. Eyes conjunctiva clear no eyelid edema noted. pupils equal round and reactive to light and accommodation. Ears, Nose, Mouth, and Throat no gross abnormality of ear auricles or external auditory canals. normal hearing noted during conversation. mucus membranes moist. Respiratory normal  breathing without difficulty. Cardiovascular 2+ dorsalis pedis/posterior tibialis pulses. 1+ pitting edema of the bilateral lower extremities. Musculoskeletal normal gait and posture. no significant deformity or arthritic changes, no loss or range of motion, no clubbing. Psychiatric this patient is able to make decisions and demonstrates good insight into disease process. Alert and Oriented x 3. pleasant and cooperative. General Notes: Upon inspection patient's wound bed actually showed signs of good granulation and epithelization at this point. Fortunately I do not see any signs of infection locally or systemically which is great news. Overall I think that he is making excellent progress here and I am very pleased in that regard. I do think the Medihoney was of great benefit for him over the past  week. Subsequently I do believe Hydrofera Blue would do well especially underneath the wrap at this time. He is in agreement with that plan. We discussed treatment options going forward and how to get this closed. Integumentary (Hair, Skin) Wound #1 status is Open. Original cause of wound was Trauma. The date acquired was: 10/22/2021. The wound is located on the Right,Medial Lower Leg. The wound measures 24cm length x 6cm width x 0.2cm depth; 113.097cm^2 area and 22.619cm^3 volume. There is Fat Layer (Subcutaneous Tissue) exposed. There is no tunneling or undermining noted. There is a large amount of serosanguineous drainage noted. The wound margin is flat and intact. There is small (1-33%) red granulation within the wound bed. There is a large (67-100%) amount of necrotic tissue within the wound bed including Eschar and Adherent Slough. Assessment Active Problems ICD-10 Struck by cow, initial encounter 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r exposed Cellulitis of right lower limb Procedures Navarra, Chaske J. ([id number]) Wound #1 Pre-procedure diagnosis of Wound  #1 is a Trauma, Other located on the Right,Medial Lower Leg . There was a Excisional Skin/Subcutaneous Tissue Debridement with a total area of 36 sq cm performed by Tommie Sams., PA-C. With the following instrument(s): Curette to remove Viable and Non-Viable tissue/material. Material removed includes Eschar, Subcutaneous Tissue, Slough, Biofilm, and Fibrin/Exudate. No specimens were taken. A time out was conducted at 10:09, prior to the start of the procedure. A Moderate amount of bleeding was controlled with Pressure. The procedure was tolerated well. Post Debridement Measurements: 24cm length x 6cm width x 0.2cm depth; 22.619cm^3 volume. Character of Wound/Ulcer Post Debridement is stable. Post procedure Diagnosis Wound #1: Same as Pre-Procedure Plan Follow-up Appointments: Wound #1 Right,Medial Lower Leg: Return Appointment in 1 week. Nurse Visit as needed - Nurse visit Wed or Thurs. Bathing/ Shower/ Hygiene: May shower with wound dressing protected with water repellent cover or cast protector. Anesthetic (Use 'Patient Medications' Section for Anesthetic Order Entry): Lidocaine applied to wound bed Edema Control - Lymphedema / Segmental Compressive Device / Other: Elevate, Exercise Daily and Avoid Standing for Long Periods of Time. Elevate legs to the level of the heart and pump ankles as often as possible Elevate leg(s) parallel to the floor when sitting. Additional Orders / Instructions: Follow Nutritious Diet and Increase Protein Intake Medications-Please add to medication list.: P.O. Antibiotics WOUND #1: - Lower Leg Wound Laterality: Right, Medial Cleanser: Soap and Water Discharge Instructions: Gently cleanse wound with antibacterial soap, rinse and pat dry prior to dressing wounds Cleanser: Wound Cleanser Discharge Instructions: Wash your hands with soap and water. Remove old dressing, discard into plastic bag and place into trash. Cleanse the wound with Wound Cleanser prior  to applying a clean dressing using gauze sponges, not tissues or cotton balls. Do not scrub or use excessive force. Pat dry using gauze sponges, not tissue or cotton balls. Primary Dressing: Hydrofera Blue Ready Transfer Foam, 4x5 (in/in) Discharge Instructions: Apply Hydrofera Blue Ready to wound bed as directed Secondary Dressing: Zetuvit Plus 4x8 (in/in) Compression Wrap: 3-LAYER WRAP - Profore Lite LF 3 Multilayer Compression Bandaging System Discharge Instructions: Apply 3 multi-layer wrap as prescribed. 1. I Minna recommend currently that we go ahead and initiate treatment with a Hydrofera Blue dressing which I think is good to be the best way to go. 2. I am also can recommend that we have the patient continue to monitor for any signs of worsening or infection obviously if anything changes he should  let me know that as well. 3. We are going to have him in a 3 layer compression wrap which I think should be sufficient and hopefully it will help the area to heal much more rapidly. We will see patient back for reevaluation in 1 week here in the clinic. If anything worsens or changes patient will contact our office for additional recommendations. Electronic Signature(s) Signed: 11/01/2021 11:50:01 AM By: Worthy Keeler PA-C Entered By: Worthy Keeler on 11/01/2021 11:50:01 ABUNDIO, TEUSCHER ([id number]) -------------------------------------------------------------------------------- ROS/PFSH Details Patient Name: [name] Date of Service: 11/01/2021 9:15 AM Medical Record Number: [id number] Patient Account Number: [id number] Date of Birth/Sex: 24-Feb-1931 (88 y.o. M) Treating RN: Cornell Barman Primary Care Provider: Lorrene Reid Other Clinician: Referring Provider: Lorrene Reid Treating Provider/Extender: Skipper Cliche in Treatment: 0 Constitutional Symptoms (General Health) Complaints and Symptoms: Negative for: Fatigue; Fever; Chills; Marked Weight  Change Ear/Nose/Mouth/Throat Complaints and Symptoms: Negative for: Difficult clearing ears; Sinusitis Hematologic/Lymphatic Complaints and Symptoms: Negative for: Bleeding / Clotting Disorders; Human Immunodeficiency Virus Cardiovascular Complaints and Symptoms: Positive for: LE edema Medical History: Positive for: Hypertension Gastrointestinal Complaints and Symptoms: Negative for: Frequent diarrhea; Nausea; Vomiting Endocrine Complaints and Symptoms: Negative for: Hepatitis; Thyroid disease; Polydypsia (Excessive Thirst) Medical History: Negative for: Type I Diabetes; Type II Diabetes Genitourinary Complaints and Symptoms: Negative for: Kidney failure/ Dialysis; Incontinence/dribbling Immunological Complaints and Symptoms: Negative for: Hives; Itching Integumentary (Skin) Complaints and Symptoms: Positive for: Wounds; Bleeding or bruising tendency Musculoskeletal Complaints and Symptoms: Negative for: Muscle Pain; Muscle Weakness Neurologic Gonterman, Jovany J. ([id number]) Complaints and Symptoms: Negative for: Numbness/parasthesias; Focal/Weakness Psychiatric Complaints and Symptoms: Negative for: Anxiety; Claustrophobia Eyes Medical History: Positive for: Cataracts; Glaucoma Respiratory Medical History: Positive for: Asthma Oncologic Medical History: Positive for: Received Chemotherapy; Received Radiation Past Medical History Notes: colon (2000), skin-hand and eye (2013) HBO Extended History Items Eyes: Eyes: Cataracts Glaucoma Immunizations Pneumococcal Vaccine: Received Pneumococcal Vaccination: Yes Received Pneumococcal Vaccination On or After 60th Birthday: Yes Tetanus Vaccine: Last tetanus shot: 10/29/2021 Implantable Devices None Family and Social History Never smoker; Marital Status - Widowed; Alcohol Use: Daily; Drug Use: No History; Caffeine Use: Never Electronic Signature(s) Signed: 11/01/2021 2:38:00 PM By: Gretta Cool, BSN, RN, CWS, Kim RN,  BSN Signed: 11/01/2021 3:51:56 PM By: Worthy Keeler PA-C Entered By: Gretta Cool BSN, RN, CWS, Kim on 11/01/2021 09:36:28 EVERETTE, MALL ([id number]) -------------------------------------------------------------------------------- SuperBill Details Patient Name: [name] Date of Service: 11/01/2021 Medical Record Number: [id number] Patient Account Number: [id number] Date of Birth/Sex: 01-10-1931 (88 y.o. M) Treating RN: Cornell Barman Primary Care Provider: Lorrene Reid Other Clinician: Referring Provider: Lorrene Reid Treating Provider/Extender: Skipper Cliche in Treatment: 0 Diagnosis Coding ICD-10 Codes Code Description W55.83XA Struck by cow, initial encounter [phone] 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r exposed L03.115 Cellulitis of right lower limb Facility Procedures CPT4 Code: [id number] Description: 07371 - WOUND CARE VISIT-LEV 3 EST PT Modifier: Quantity: 1 CPT4 Code: [id number] Description: 46270 - DEB SUBQ TISSUE 20 SQ CM/< Modifier: Quantity: 1 CPT4 Code: Description: ICD-10 Diagnosis Description J50.093 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 Modifier: r exposed Quantity: CPT4 Code: 81829937 Description: 16967 - DEB SUBQ TISS EA ADDL 20CM Modifier: Quantity: 1 CPT4 Code: Description: ICD-10 Diagnosis Description 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 Modifier: r exposed Quantity: Physician Procedures CPT4 Code: 1751025 Description: WC PHYS LEVEL 3 o NEW PT Modifier: 25 Quantity:  1 CPT4 Code: Description: ICD-10 Diagnosis Description W55.22XA Struck by cow, initial encounter [phone] 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 L03.115 Cellulitis of right lower limb Modifier: r exposed Quantity: CPT4 Code: 4688737 Description: 30816 - WC PHYS SUBQ TISS 20 SQ CM Modifier: Quantity: 1 CPT4  Code: Description: ICD-10 Diagnosis Description E38.706 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

## 2021-11-02 LAB — CULTURE, BLOOD (ROUTINE X 2)
Culture: NO GROWTH
Culture: NO GROWTH
Special Requests: ADEQUATE

## 2021-11-04 DIAGNOSIS — L97812 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r exposed: Secondary | ICD-10-CM | POA: Diagnosis not present

## 2021-11-04 DIAGNOSIS — L03115 Cellulitis of right lower limb: Secondary | ICD-10-CM | POA: Diagnosis not present

## 2021-11-04 DIAGNOSIS — W5522XA Struck by cow, initial encounter: Secondary | ICD-10-CM | POA: Diagnosis not present

## 2021-11-04 DIAGNOSIS — I10 Essential (primary) hypertension: Secondary | ICD-10-CM | POA: Diagnosis not present

## 2021-11-04 NOTE — Progress Notes (Signed)
[name], [name] ([id number]) Visit Report for 11/04/2021 Physician Orders Details Patient Name: [name], [name] Date of Service: 11/04/2021 8:00 AM Medical Record Number: [id number] Patient Account Number: [id number] Date of Birth/Sex: 14-Aug-1930 (88 y.o. M) Treating RN: Levora Dredge Primary Care Provider: Lorrene Reid Other Clinician: Referring Provider: Lorrene Reid Treating Provider/Extender: Skipper Cliche in Treatment: 0 Verbal / Phone Orders: No Diagnosis Coding Follow-up Appointments Wound #1 Right,Medial Lower Leg o Return Appointment in 1 week. o Nurse Visit as needed - Nurse visit Wed or Thurs. Bathing/ Shower/ Hygiene o May shower with wound dressing protected with water repellent cover or cast protector. Anesthetic (Use 'Patient Medications' Section for Anesthetic Order Entry) o Lidocaine applied to wound bed Edema Control - Lymphedema / Segmental Compressive Device / Other o Elevate, Exercise Daily and Avoid Standing for Long Periods of Time. o Elevate legs to the level of the heart and pump ankles as often as possible o Elevate leg(s) parallel to the floor when sitting. Additional Orders / Instructions o Follow Nutritious Diet and Increase Protein Intake Medications-Please add to medication list. o P.O. Antibiotics Wound Treatment Wound #1 - Lower Leg Wound Laterality: Right, Medial Cleanser: Soap and Water Discharge Instructions: Gently cleanse wound with antibacterial soap, rinse and pat dry prior to dressing wounds Cleanser: Wound Cleanser Discharge Instructions: Wash your hands with soap and water. Remove old dressing, discard into plastic bag and place into trash. Cleanse the wound with Wound Cleanser prior to applying a clean dressing using gauze sponges, not tissues or cotton balls. Do not scrub or use excessive force. Pat dry using gauze sponges, not tissue or cotton balls. Primary Dressing: Xeroform 5x9-HBD (in/in) Discharge  Instructions: Apply Xeroform 5x9-HBD (in/in) as directed Double layer Secondary Dressing: Zetuvit Plus 4x8 (in/in) Compression Wrap: 3-LAYER WRAP - Profore Lite LF 3 Multilayer Compression Bandaging System Discharge Instructions: Apply 3 multi-layer wrap as prescribed. Electronic Signature(s) Signed: 11/04/2021 8:54:14 AM By: Levora Dredge Signed: 11/04/2021 4:56:25 PM By: Worthy Keeler PA-C Entered By: Levora Dredge on 11/04/2021 08:54:14 [name], [name] ([id number]) -------------------------------------------------------------------------------- SuperBill Details Patient Name: [name] Date of Service: 11/04/2021 Medical Record Number: [id number] Patient Account Number: [id number] Date of Birth/Sex: [birth year] (88 y.o. M) Treating RN: Levora Dredge Primary Care Provider: Lorrene Reid Other Clinician: Referring Provider: Lorrene Reid Treating Provider/Extender: Skipper Cliche in Treatment: 0 Diagnosis Coding ICD-10 Codes Code Description W55.22XA Struck by cow, initial encounter [phone] 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r exposed L03.115 Cellulitis of right lower limb Facility Procedures CPT4 Code: 81448185 Description: (Facility Use Only) [phone] - [facility] LWR RT LEG Modifier: Quantity: 1 Electronic Signature(s) Signed: 11/04/2021 8:54:54 AM By: Levora Dredge Signed: 11/04/2021 4:56:25 PM By: Worthy Keeler PA-C Entered By: Levora Dredge on 11/04/2021 08:54:54

## 2021-11-04 NOTE — Progress Notes (Signed)
BURKE, TERRY ([id number]) Visit Report for 11/04/2021 Arrival Information Details Patient Name: [name], [name] Date of Service: 11/04/2021 8:00 AM Medical Record Number: [id number] Patient Account Number: [id number] Date of Birth/Sex: Jun 29, 1930 (88 y.o. M) Treating RN: Levora Dredge Primary Care Ori Kreiter: Lorrene Reid Other Clinician: Referring Sten Dematteo: Lorrene Reid Treating Adalei Novell/Extender: Skipper Cliche in Treatment: 0 Visit Information History Since Last Visit Added or deleted any medications: No Patient Arrived: Ambulatory Any new allergies or adverse reactions: No Arrival Time: 08:07 Had a fall or experienced change in No Accompanied By: wife activities of daily living that may affect Transfer Assistance: None risk of falls: Patient Identification Verified: Yes Hospitalized since last visit: No Secondary Verification Process Completed: Yes Has Dressing in Place as Prescribed: Yes Patient Requires Transmission-Based Precautions: No Has Compression in Place as Prescribed: Yes Patient Has Alerts: No Pain Present Now: No Electronic Signature(s) Signed: 11/04/2021 4:47:59 PM By: Levora Dredge Entered By: Levora Dredge on 11/04/2021 08:08:57 [name] ([id number]) -------------------------------------------------------------------------------- Clinic Level of Care Assessment Details Patient Name: [name] Date of Service: 11/04/2021 8:00 AM Medical Record Number: [id number] Patient Account Number: [id number] Date of Birth/Sex: 07-14-1930 (88 y.o. M) Treating RN: Levora Dredge Primary Care Chanon Loney: Lorrene Reid Other Clinician: Referring Timiko Offutt: Lorrene Reid Treating Marquise Lambson/Extender: Skipper Cliche in Treatment: 0 Clinic Level of Care Assessment Items TOOL 1 Quantity Score '[]'$  - Use when EandM and Procedure is performed on INITIAL visit 0 ASSESSMENTS - Nursing Assessment / Reassessment '[]'$  - General Physical Exam (combine  w/ comprehensive assessment (listed just below) when performed on new 0 pt. evals) '[]'$  - 0 Comprehensive Assessment (HX, ROS, Risk Assessments, Wounds Hx, etc.) ASSESSMENTS - Wound and Skin Assessment / Reassessment '[]'$  - Dermatologic / Skin Assessment (not related to wound area) 0 ASSESSMENTS - Ostomy and/or Continence Assessment and Care '[]'$  - Incontinence Assessment and Management 0 '[]'$  - 0 Ostomy Care Assessment and Management (repouching, etc.) PROCESS - Coordination of Care '[]'$  - Simple Patient / Family Education for ongoing care 0 '[]'$  - 0 Complex (extensive) Patient / Family Education for ongoing care '[]'$  - 0 Staff obtains Programmer, systems, Records, Test Results / Process Orders '[]'$  - 0 Staff telephones HHA, Nursing Homes / Clarify orders / etc '[]'$  - 0 Routine Transfer to another Facility (non-emergent condition) '[]'$  - 0 Routine Hospital Admission (non-emergent condition) '[]'$  - 0 New Admissions / Biomedical engineer / Ordering NPWT, Apligraf, etc. '[]'$  - 0 Emergency Hospital Admission (emergent condition) PROCESS - Special Needs '[]'$  - Pediatric / Minor Patient Management 0 '[]'$  - 0 Isolation Patient Management '[]'$  - 0 Hearing / Language / Visual special needs '[]'$  - 0 Assessment of Community assistance (transportation, D/C planning, etc.) '[]'$  - 0 Additional assistance / Altered mentation '[]'$  - 0 Support Surface(s) Assessment (bed, cushion, seat, etc.) INTERVENTIONS - Miscellaneous '[]'$  - External ear exam 0 '[]'$  - 0 Patient Transfer (multiple staff / Civil Service fast streamer / Similar devices) '[]'$  - 0 Simple Staple / Suture removal (25 or less) '[]'$  - 0 Complex Staple / Suture removal (26 or more) '[]'$  - 0 Hypo/Hyperglycemic Management (do not check if billed separately) '[]'$  - 0 Ankle / Brachial Index (ABI) - do not check if billed separately Has the patient been seen at the hospital within the last three years: Yes Total Score: 0 Level Of Care: ____ [name] ([id number]) Electronic  Signature(s) Signed: 11/04/2021 4:47:59 PM By: Levora Dredge Entered By: Levora Dredge on 11/04/2021 08:54:42 [name], [name] ([id number]) -------------------------------------------------------------------------------- Compression Therapy  Details Patient Name: [name], [name] Date of Service: 11/04/2021 8:00 AM Medical Record Number: [id number] Patient Account Number: [id number] Date of Birth/Sex: [birth year] (88 y.o. M) Treating RN: Levora Dredge Primary Care Tarren Velardi: Lorrene Reid Other Clinician: Referring Zae Kirtz: Lorrene Reid Treating Lititia Sen/Extender: Skipper Cliche in Treatment: 0 Compression Therapy Performed for Wound Assessment: Wound #1 Right,Medial Lower Leg Performed By: Clinician Levora Dredge, RN Compression Type: Three Layer Notes pt tolerated wrap without issue Electronic Signature(s) Signed: 11/04/2021 4:47:59 PM By: Levora Dredge Entered By: Levora Dredge on 11/04/2021 08:09:33 [name], [name] ([id number]) -------------------------------------------------------------------------------- Encounter Discharge Information Details Patient Name: [name] Date of Service: 11/04/2021 8:00 AM Medical Record Number: [id number] Patient Account Number: [id number] Date of Birth/Sex: March 30, 1931 (88 y.o. M) Treating RN: Levora Dredge Primary Care Destynee Stringfellow: Lorrene Reid Other Clinician: Referring Sariyah Corcino: Lorrene Reid Treating Davidlee Jeanbaptiste/Extender: Skipper Cliche in Treatment: 0 Encounter Discharge Information Items Discharge Condition: Stable Ambulatory Status: Ambulatory Discharge Destination: Home Transportation: Private Auto Accompanied By: self Schedule Follow-up Appointment: Yes Clinical Summary of Care: Electronic Signature(s) Signed: 11/04/2021 8:54:34 AM By: Levora Dredge Entered By: Levora Dredge on 11/04/2021 08:54:34 [name]  ([id number]) -------------------------------------------------------------------------------- Wound Assessment Details Patient Name: [name] Date of Service: 11/04/2021 8:00 AM Medical Record Number: [id number] Patient Account Number: [id number] Date of Birth/Sex: Sep 29, 1930 (88 y.o. M) Treating RN: Levora Dredge Primary Care Buffey Zabinski: Lorrene Reid Other Clinician: Referring Rashel Okeefe: Lorrene Reid Treating Dema Timmons/Extender: Skipper Cliche in Treatment: 0 Wound Status Wound Number: 1 Primary Trauma, Other Etiology: Wound Location: Right, Medial Lower Leg Wound Open Wounding Event: Trauma Status: Date Acquired: 10/22/2021 Comorbid Cataracts, Glaucoma, Asthma, Hypertension, Received Weeks Of Treatment: 0 History: Chemotherapy, Received Radiation Clustered Wound: Yes Wound Measurements Length: (cm) 24 Width: (cm) 6 Depth: (cm) 0.2 Clustered Quantity: 3 Area: (cm) 113.097 Volume: (cm) 22.619 % Reduction in Area: 0% % Reduction in Volume: 0% Epithelialization: None Wound Description Classification: Full Thickness Without Exposed Support Structu Wound Margin: Flat and Intact Exudate Amount: Large Exudate Type: Serosanguineous Exudate Color: red, brown res Foul Odor After Cleansing: No Slough/Fibrino Yes Wound Bed Granulation Amount: Small (1-33%) Exposed Structure Granulation Quality: Red Fascia Exposed: No Necrotic Amount: Large (67-100%) Fat Layer (Subcutaneous Tissue) Exposed: Yes Necrotic Quality: Eschar, Adherent Slough Tendon Exposed: No Muscle Exposed: No Joint Exposed: No Bone Exposed: No Treatment Notes Wound #1 (Lower Leg) Wound Laterality: Right, Medial Cleanser Soap and Water Discharge Instruction: Gently cleanse wound with antibacterial soap, rinse and pat dry prior to dressing wounds Wound Cleanser Discharge Instruction: Wash your hands with soap and water. Remove old dressing, discard into plastic bag and place into trash. Cleanse  the wound with Wound Cleanser prior to applying a clean dressing using gauze sponges, not tissues or cotton balls. Do not scrub or use excessive force. Pat dry using gauze sponges, not tissue or cotton balls. Peri-Wound Care Topical Primary Dressing Xeroform 5x9-HBD (in/in) Discharge Instruction: Apply Xeroform 5x9-HBD (in/in) as directed Double layer Secondary Dressing Zetuvit Plus 4x8 (in/in) Batzel, Jguadalupe J. ([id number]) Secured With Compression Wrap 3-LAYER WRAP - Profore Lite LF 3 Multilayer Compression Bandaging System Discharge Instruction: Apply 3 multi-layer wrap as prescribed. Compression Stockings Add-Ons Electronic Signature(s) Signed: 11/04/2021 4:47:59 PM By: Levora Dredge Entered By: Levora Dredge on 11/04/2021 08:09:15

## 2021-11-08 ENCOUNTER — Encounter: Payer: Medicare HMO | Admitting: Physician Assistant

## 2021-11-08 DIAGNOSIS — W5522XA Struck by cow, initial encounter: Secondary | ICD-10-CM | POA: Diagnosis not present

## 2021-11-08 DIAGNOSIS — L97812 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r exposed: Secondary | ICD-10-CM | POA: Diagnosis not present

## 2021-11-08 DIAGNOSIS — L03115 Cellulitis of right lower limb: Secondary | ICD-10-CM | POA: Diagnosis not present

## 2021-11-08 DIAGNOSIS — I10 Essential (primary) hypertension: Secondary | ICD-10-CM | POA: Diagnosis not present

## 2021-11-08 NOTE — Progress Notes (Addendum)
CAPRI, RABEN ([id number]) Visit Report for 11/08/2021 Chief Complaint Document Details Patient Name: [name], [name] Date of Service: 11/08/2021 3:30 PM Medical Record Number: [id number] Patient Account Number: [id number] Date of Birth/Sex: [birth year] (88 y.o. M) Treating RN: Carlene Coria Primary Care Provider: Lorrene Reid Other Clinician: Referring Provider: Lorrene Reid Treating Provider/Extender: Skipper Cliche in Treatment: 1 Information Obtained from: Patient Chief Complaint Right LE Ulcer and Cellulitis Electronic Signature(s) Signed: 11/08/2021 3:40:55 PM By: Worthy Keeler PA-C Entered By: Worthy Keeler on 11/08/2021 15:40:55 [name], [name] ([id number]) -------------------------------------------------------------------------------- HPI Details Patient Name: [name] Date of Service: 11/08/2021 3:30 PM Medical Record Number: [id number] Patient Account Number: [id number] Date of Birth/Sex: Aug 18, 1930 (88 y.o. M) Treating RN: Carlene Coria Primary Care Provider: Lorrene Reid Other Clinician: Referring Provider: Lorrene Reid Treating Provider/Extender: Skipper Cliche in Treatment: 1 History of Present Illness HPI Description: 11-01-2021 upon evaluation today patient actually presents for initial inspection here in the clinic of actually been involved in his care to some degree since Thursday of last week. Subsequently the patient has been seen by his primary care provider on Wednesday and Thursday of last week they were start him on Rocephin to try to get infection under control but he still had significant issues with a wound on his right leg where he was trampled by his cow 5 days prior to that point. He tells me that the health went down his leg and subsequently she also stepped on him in the belly/abdomen area. With that being said the patient tells me that he did not go see anyone for 5 days because he was "just being stubborn". Subsequently  he ended up eventually going to the doctor and being seen that is when I was contacted and suggested that he really need to go to the ER for further evaluation and treatment. He ended up doing so. They had him on IV antibiotics in the hospital and discharged him on cefadroxil 500 mg 2 times a day for 6 days. He starts that today. With that being said overall he really seems to be doing quite well in my opinion. The good news is they also did do an ABI while he was in the hospital this showed that he reading on the right of 1.37 with a TBI of 0.80 and on the left 1.26 with a TBI of 0.92. Medihoney was initiated as treatment and that has helped to clean up the wound quite significantly in the interim since I saw the first picture that was taken on Thursday of last week that is in epic. Fortunately the patient really has no other major medical problems other than the fact that he does have some issues here with leg swelling this is 1+ pitting but otherwise he is doing quite well in my opinion. 11-08-2021 upon evaluation today patient appears to be doing well with regard to his wound. Has been tolerating the dressing changes without complication. Fortunately there does not appear to be any signs of active infection locally or systemically at this time. No fevers, chills, nausea, vomiting, or diarrhea. Electronic Signature(s) Signed: 11/08/2021 4:03:31 PM By: Worthy Keeler PA-C Entered By: Worthy Keeler on 11/08/2021 16:03:31 [name], [name] ([id number]) -------------------------------------------------------------------------------- Physical Exam Details Patient Name: [name] Date of Service: 11/08/2021 3:30 PM Medical Record Number: [id number] Patient Account Number: [id number] Date of Birth/Sex: 1930/06/18 (88 y.o. M) Treating RN: Carlene Coria Primary Care Provider: Lorrene Reid Other Clinician: Referring Provider: Lorrene Reid Treating  Provider/Extender: Jeri Cos Weeks in  Treatment: 1 Constitutional Well-nourished and well-hydrated in no acute distress. Respiratory normal breathing without difficulty. Psychiatric this patient is able to make decisions and demonstrates good insight into disease process. Alert and Oriented x 3. pleasant and cooperative. Notes Upon inspection patient's wound bed showed signs of good granulation and epithelization at this point. Overall I am extremely pleased with where we stand and I do believe that we are on the right track here. The Hydrofera Blue was sticking so we switch to Xeroform gauze and that has done an excellent job for him. Electronic Signature(s) Signed: 11/08/2021 4:03:50 PM By: Worthy Keeler PA-C Entered By: Worthy Keeler on 11/08/2021 16:03:50 [name], [name] ([id number]) -------------------------------------------------------------------------------- Physician Orders Details Patient Name: [name] Date of Service: 11/08/2021 3:30 PM Medical Record Number: [id number] Patient Account Number: [id number] Date of Birth/Sex: [birth year] (88 y.o. M) Treating RN: Carlene Coria Primary Care Provider: Lorrene Reid Other Clinician: Referring Provider: Lorrene Reid Treating Provider/Extender: Skipper Cliche in Treatment: 1 Verbal / Phone Orders: No Diagnosis Coding ICD-10 Coding Code Description W55.7XA Struck by cow, initial encounter [phone] 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r exposed L03.115 Cellulitis of right lower limb Follow-up Appointments Wound #1 Right,Medial Lower Leg o Return Appointment in 1 week. Bathing/ Shower/ Hygiene o May shower with wound dressing protected with water repellent cover or cast protector. Anesthetic (Use 'Patient Medications' Section for Anesthetic Order Entry) o Lidocaine applied to wound bed Edema Control - Lymphedema / Segmental Compressive Device / Other o Elevate, Exercise Daily and Avoid Standing for  Long Periods of Time. o Elevate legs to the level of the heart and pump ankles as often as possible o Elevate leg(s) parallel to the floor when sitting. Additional Orders / Instructions o Follow Nutritious Diet and Increase Protein Intake Wound Treatment Wound #1 - Lower Leg Wound Laterality: Right, Medial Cleanser: Soap and Water 1 x Per Week/30 Days Discharge Instructions: Gently cleanse wound with antibacterial soap, rinse and pat dry prior to dressing wounds Cleanser: Wound Cleanser 1 x Per Week/30 Days Discharge Instructions: Wash your hands with soap and water. Remove old dressing, discard into plastic bag and place into trash. Cleanse the wound with Wound Cleanser prior to applying a clean dressing using gauze sponges, not tissues or cotton balls. Do not scrub or use excessive force. Pat dry using gauze sponges, not tissue or cotton balls. Primary Dressing: Xeroform 5x9-HBD (in/in) 1 x Per Week/30 Days Discharge Instructions: Apply Xeroform 5x9-HBD (in/in) as directed Double layer Secondary Dressing: Zetuvit Plus 4x8 (in/in) 1 x Per Week/30 Days Compression Wrap: 3-LAYER WRAP - Profore Lite LF 3 Multilayer Compression Bandaging System 1 x Per Week/30 Days Discharge Instructions: Apply 3 multi-layer wrap as prescribed. Electronic Signature(s) Signed: 11/08/2021 4:34:19 PM By: Worthy Keeler PA-C Signed: 11/12/2021 10:45:13 AM By: Carlene Coria RN Previous Signature: 11/08/2021 3:56:18 PM Version By: Carlene Coria RN Entered By: Carlene Coria on 11/08/2021 16:00:37 [name], [name] ([id number]) -------------------------------------------------------------------------------- Problem List Details Patient Name: [name] Date of Service: 11/08/2021 3:30 PM Medical Record Number: [id number] Patient Account Number: [id number] Date of Birth/Sex: [birth year] (88 y.o. M) Treating RN: Carlene Coria Primary Care Provider: Lorrene Reid Other Clinician: Referring Provider: Lorrene Reid Treating Provider/Extender: Skipper Cliche in Treatment: 1 Active Problems ICD-10 Encounter Code Description Active Date MDM Diagnosis W55.37XA Struck by cow, initial encounter 11/01/2021 No Yes L97.812 Non-pressure chronic ulcer of other part of right  lower leg with fat layer 11/01/2021 No Yes exposed L03.115 Cellulitis of right lower limb 11/01/2021 No Yes Inactive Problems Resolved Problems Electronic Signature(s) Signed: 11/08/2021 3:40:53 PM By: Worthy Keeler PA-C Entered By: Worthy Keeler on 11/08/2021 15:40:52 [name], [name] ([id number]) -------------------------------------------------------------------------------- Progress Note Details Patient Name: [name] Date of Service: 11/08/2021 3:30 PM Medical Record Number: [id number] Patient Account Number: [id number] Date of Birth/Sex: 1930-12-18 (88 y.o. M) Treating RN: Carlene Coria Primary Care Provider: Lorrene Reid Other Clinician: Referring Provider: Lorrene Reid Treating Provider/Extender: Skipper Cliche in Treatment: 1 Subjective Chief Complaint Information obtained from Patient Right LE Ulcer and Cellulitis History of Present Illness (HPI) 11-01-2021 upon evaluation today patient actually presents for initial inspection here in the clinic of actually been involved in his care to some degree since Thursday of last week. Subsequently the patient has been seen by his primary care provider on Wednesday and Thursday of last week they were start him on Rocephin to try to get infection under control but he still had significant issues with a wound on his right leg where he was trampled by his cow 5 days prior to that point. He tells me that the health went down his leg and subsequently she also stepped on him in the belly/abdomen area. With that being said the patient tells me that he did not go see anyone for 5 days because he was "just being stubborn". Subsequently he ended up eventually going to  the doctor and being seen that is when I was contacted and suggested that he really need to go to the ER for further evaluation and treatment. He ended up doing so. They had him on IV antibiotics in the hospital and discharged him on cefadroxil 500 mg 2 times a day for 6 days. He starts that today. With that being said overall he really seems to be doing quite well in my opinion. The good news is they also did do an ABI while he was in the hospital this showed that he reading on the right of 1.37 with a TBI of 0.80 and on the left 1.26 with a TBI of 0.92. Medihoney was initiated as treatment and that has helped to clean up the wound quite significantly in the interim since I saw the first picture that was taken on Thursday of last week that is in epic. Fortunately the patient really has no other major medical problems other than the fact that he does have some issues here with leg swelling this is 1+ pitting but otherwise he is doing quite well in my opinion. 11-08-2021 upon evaluation today patient appears to be doing well with regard to his wound. Has been tolerating the dressing changes without complication. Fortunately there does not appear to be any signs of active infection locally or systemically at this time. No fevers, chills, nausea, vomiting, or diarrhea. Objective Constitutional Well-nourished and well-hydrated in no acute distress. Vitals Time Taken: 3:35 PM, Height: 68 in, Weight: 130 lbs, BMI: 19.8, Temperature: 97.9 F, Pulse: 85 bpm, Respiratory Rate: 20 breaths/min, Blood Pressure: 172/78 mmHg. Respiratory normal breathing without difficulty. Psychiatric this patient is able to make decisions and demonstrates good insight into disease process. Alert and Oriented x 3. pleasant and cooperative. General Notes: Upon inspection patient's wound bed showed signs of good granulation and epithelization at this point. Overall I am extremely pleased with where we stand and I do believe  that we are on the right track here. The Hydrofera Blue was  sticking so we switch to Xeroform gauze and that has done an excellent job for him. Integumentary (Hair, Skin) Wound #1 status is Open. Original cause of wound was Trauma. The date acquired was: 10/22/2021. The wound has been in treatment 1 weeks. The wound is located on the Right,Medial Lower Leg. The wound measures 7cm length x 0.8cm width x 0.1cm depth; 4.398cm^2 area and 0.44cm^3 volume. There is Fat Layer (Subcutaneous Tissue) exposed. There is no tunneling or undermining noted. There is a large amount of serosanguineous drainage noted. The wound margin is flat and intact. There is small (1-33%) red granulation within the wound bed. There is a large (67-100%) amount of necrotic tissue within the wound bed including Eschar and Adherent Slough. [name], [name] ([id number]) Assessment Active Problems ICD-10 Struck by cow, initial encounter 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r exposed Cellulitis of right lower limb Procedures Wound #1 Pre-procedure diagnosis of Wound #1 is a Trauma, Other located on the Right,Medial Lower Leg . There was a Three Layer Compression Therapy Procedure by Carlene Coria, RN. Post procedure Diagnosis Wound #1: Same as Pre-Procedure Plan Follow-up Appointments: Wound #1 Right,Medial Lower Leg: Return Appointment in 1 week. Bathing/ Shower/ Hygiene: May shower with wound dressing protected with water repellent cover or cast protector. Anesthetic (Use 'Patient Medications' Section for Anesthetic Order Entry): Lidocaine applied to wound bed Edema Control - Lymphedema / Segmental Compressive Device / Other: Elevate, Exercise Daily and Avoid Standing for Long Periods of Time. Elevate legs to the level of the heart and pump ankles as often as possible Elevate leg(s) parallel to the floor when sitting. Additional Orders / Instructions: Follow Nutritious Diet and  Increase Protein Intake WOUND #1: - Lower Leg Wound Laterality: Right, Medial Cleanser: Soap and Water 1 x Per Week/30 Days Discharge Instructions: Gently cleanse wound with antibacterial soap, rinse and pat dry prior to dressing wounds Cleanser: Wound Cleanser 1 x Per Week/30 Days Discharge Instructions: Wash your hands with soap and water. Remove old dressing, discard into plastic bag and place into trash. Cleanse the wound with Wound Cleanser prior to applying a clean dressing using gauze sponges, not tissues or cotton balls. Do not scrub or use excessive force. Pat dry using gauze sponges, not tissue or cotton balls. Primary Dressing: Xeroform 5x9-HBD (in/in) 1 x Per Week/30 Days Discharge Instructions: Apply Xeroform 5x9-HBD (in/in) as directed Double layer Secondary Dressing: Zetuvit Plus 4x8 (in/in) 1 x Per Week/30 Days Compression Wrap: 3-LAYER WRAP - Profore Lite LF 3 Multilayer Compression Bandaging System 1 x Per Week/30 Days Discharge Instructions: Apply 3 multi-layer wrap as prescribed. 1. I would recommend that we go ahead and continue with the wound care measures as before and the patient is in agreement with the plan. This includes the use of the Xeroform gauze dressing which I think is doing an awesome job here. 2. I am also can recommend that we continue with the Zetuvit to cover. 3. We will also continue with the 3 layer compression wrap which I think is doing an amazing job here. We will see patient back for reevaluation in 1 week here in the clinic. If anything worsens or changes patient will contact our office for additional recommendations. Electronic Signature(s) Signed: 11/08/2021 4:04:28 PM By: Worthy Keeler PA-C Entered By: Worthy Keeler on 11/08/2021 16:04:27 [name], [name] ([id number]) -------------------------------------------------------------------------------- SuperBill Details Patient Name: [name] Date of Service: 11/08/2021 Medical Record  Number: [id number] Patient Account Number:  [id number] Date of Birth/Sex: 1930-12-18 (88 y.o. M) Treating RN: Carlene Coria Primary Care Provider: Lorrene Reid Other Clinician: Referring Provider: Lorrene Reid Treating Provider/Extender: Skipper Cliche in Treatment: 1 Diagnosis Coding ICD-10 Codes Code Description W55.12XA Struck by cow, initial encounter [phone] 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r exposed L03.115 Cellulitis of right lower limb Facility Procedures CPT4 Code: [id number] Description: (Facility Use Only) [id number] - APPLY MULTLAY COMPRS LWR RT LEG Modifier: Quantity: 1 Physician Procedures CPT4 Code: 2174715 Description: 95396 - WC PHYS LEVEL 3 - EST PT Modifier: Quantity: 1 CPT4 Code: Description: ICD-10 Diagnosis Description W55.27XA Struck by cow, initial encounter [phone] Non-pressure chronic ulcer of other part of right lower leg with fat la L03.115 Cellulitis of right lower limb Modifier: yer exposed Quantity: Electronic Signature(s) Signed: 11/08/2021 4:04:38 PM By: Worthy Keeler PA-C Entered By: Worthy Keeler on 11/08/2021 16:04:38
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
## 2021-11-15 ENCOUNTER — Encounter: Payer: Medicare HMO | Admitting: Physician Assistant

## 2021-11-15 DIAGNOSIS — I10 Essential (primary) hypertension: Secondary | ICD-10-CM | POA: Diagnosis not present

## 2021-11-15 DIAGNOSIS — W5522XA Struck by cow, initial encounter: Secondary | ICD-10-CM | POA: Diagnosis not present

## 2021-11-15 DIAGNOSIS — L97812 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r exposed: Secondary | ICD-10-CM | POA: Diagnosis not present

## 2021-11-15 DIAGNOSIS — L03115 Cellulitis of right lower limb: Secondary | ICD-10-CM | POA: Diagnosis not present
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
## 2021-11-22 ENCOUNTER — Encounter: Payer: Medicare HMO | Admitting: Internal Medicine

## 2021-11-22 DIAGNOSIS — L97812 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r exposed: Secondary | ICD-10-CM | POA: Diagnosis not present

## 2021-11-22 DIAGNOSIS — L03115 Cellulitis of right lower limb: Secondary | ICD-10-CM | POA: Diagnosis not present

## 2021-11-22 DIAGNOSIS — I10 Essential (primary) hypertension: Secondary | ICD-10-CM | POA: Diagnosis not present

## 2021-11-22 DIAGNOSIS — W5522XA Struck by cow, initial encounter: Secondary | ICD-10-CM | POA: Diagnosis not present
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
## 2021-11-24 NOTE — Progress Notes (Signed)
CORA, STETSON ([id number]) Visit Report for 11/22/2021 Debridement Details Patient Name: [name], [name] Date of Service: 11/22/2021 8:15 AM Medical Record Number: [id number] Patient Account Number: [id number] Date of Birth/Sex: April 07, 1931 (88 y.o. M) Treating RN: Cornell Barman Primary Care Provider: Lorrene Reid Other Clinician: Massie Kluver Referring Provider: Lorrene Reid Treating Provider/Extender: Tito Dine in Treatment: 3 Debridement Performed for Wound #1 Right,Medial Lower Leg Assessment: Performed By: Physician Ricard Dillon, MD Debridement Type: Debridement Level of Consciousness (Pre- Awake and Alert procedure): Pre-procedure Verification/Time Out Yes - 08:36 Taken: Start Time: 08:36 Total Area Debrided (L x W): 8 (cm) x 2.2 (cm) = 17.6 (cm) Tissue and other material Viable, Non-Viable, Subcutaneous, Skin: Epidermis debrided: Level: Skin/Subcutaneous Tissue Debridement Description: Excisional Instrument: Curette Bleeding: Minimum Hemostasis Achieved: Pressure End Time: 08:40 Response to Treatment: Procedure was tolerated well Level of Consciousness (Post- Awake and Alert procedure): Post Debridement Measurements of Total Wound Length: (cm) 8 Width: (cm) 2.2 Depth: (cm) 0.2 Volume: (cm) 2.765 Character of Wound/Ulcer Post Debridement: Stable Post Procedure Diagnosis Same as Pre-procedure Electronic Signature(s) Signed: 11/22/2021 4:17:57 PM By: Linton Ham MD Signed: 11/22/2021 5:31:53 PM By: Gretta Cool, BSN, RN, CWS, Kim RN, BSN Signed: 11/24/2021 8:24:32 AM By: Massie Kluver Entered By: Massie Kluver on 11/22/2021 08:39:33 ELUZER, HOWDESHELL ([id number]) -------------------------------------------------------------------------------- HPI Details Patient Name: [name] Date of Service: 11/22/2021 8:15 AM Medical Record Number: [id number] Patient Account Number: [id number] Date of Birth/Sex: Jan 13, 1931 (88 y.o.  M) Treating RN: Cornell Barman Primary Care Provider: Lorrene Reid Other Clinician: Massie Kluver Referring Provider: Lorrene Reid Treating Provider/Extender: Tito Dine in Treatment: 3 History of Present Illness HPI Description: 11-01-2021 upon evaluation today patient actually presents for initial inspection here in the clinic of actually been involved in his care to some degree since Thursday of last week. Subsequently the patient has been seen by his primary care provider on Wednesday and Thursday of last week they were start him on Rocephin to try to get infection under control but he still had significant issues with a wound on his right leg where he was trampled by his cow 5 days prior to that point. He tells me that the health went down his leg and subsequently she also stepped on him in the belly/abdomen area. With that being said the patient tells me that he did not go see anyone for 5 days because he was "just being stubborn". Subsequently he ended up eventually going to the doctor and being seen that is when I was contacted and suggested that he really need to go to the ER for further evaluation and treatment. He ended up doing so. They had him on IV antibiotics in the hospital and discharged him on cefadroxil 500 mg 2 times a day for 6 days. He starts that today. With that being said overall he really seems to be doing quite well in my opinion. The good news is they also did do an ABI while he was in the hospital this showed that he reading on the right of 1.37 with a TBI of 0.80 and on the left 1.26 with a TBI of 0.92. Medihoney was initiated as treatment and that has helped to clean up the wound quite significantly in the interim since I saw the first picture that was taken on Thursday of last week that is in epic. Fortunately the patient really has no other major medical problems other than the fact that he does have some issues here with leg  swelling this is 1+  pitting but otherwise he is doing quite well in my opinion. 11-08-2021 upon evaluation today patient appears to be doing well with regard to his wound. Has been tolerating the dressing changes without complication. Fortunately there does not appear to be any signs of active infection locally or systemically at this time. No fevers, chills, nausea, vomiting, or diarrhea. 11-15-2021 upon evaluation patient's wound is actually showing signs of excellent improvement. Fortunately I do not see any evidence of infection locally or systemically which is great news and overall I am extremely pleased I do believe that the patient is really doing quite well here today. I think that he is on the right track to complete resolution of the wound is significantly smaller compared to where we started. No sharp debridement necessary today. 7/24; sizable laceration wound on the right medial lower leg. Using Xeroform/the tube and in 3 layer compression. He is doing well. Just superior part still has some small open areas but the most part of this is epithelializing and may be healed by next week or the week after. The lower part of the wound had a nonviable surface Electronic Signature(s) Signed: 11/22/2021 4:17:57 PM By: Linton Ham MD Entered By: Linton Ham on 11/22/2021 08:44:19 BERNIE, RANSFORD ([id number]) -------------------------------------------------------------------------------- Physical Exam Details Patient Name: [name] Date of Service: 11/22/2021 8:15 AM Medical Record Number: [id number] Patient Account Number: [id number] Date of Birth/Sex: 1930-05-08 (88 y.o. M) Treating RN: Cornell Barman Primary Care Provider: Lorrene Reid Other Clinician: Massie Kluver Referring Provider: Lorrene Reid Treating Provider/Extender: Tito Dine in Treatment: 3 Constitutional Patient is hypertensive.. Pulse regular and within target range for patient.Marland Kitchen Respirations regular,  non-labored and within target range.Marland Kitchen appears in no distress. Cardiovascular Pedal pulses palpable. No edema in the right leg.. Notes Wound exam; superior part of the long laceration on the right medial leg is almost fully epithelialized only several small areas remain on epithelialize. There is no evidence of surrounding infection. Distally hyper granulated granulation with adherent nonviable surface. I used a #5 curette to remove this hemostasis with direct pressure. Electronic Signature(s) Signed: 11/22/2021 4:17:57 PM By: Linton Ham MD Entered By: Linton Ham on 11/22/2021 08:46:33 [name] ([id number]) -------------------------------------------------------------------------------- Physician Orders Details Patient Name: [name] Date of Service: 11/22/2021 8:15 AM Medical Record Number: [id number] Patient Account Number: [id number] Date of Birth/Sex: Sep 27, 1930 (88 y.o. M) Treating RN: Cornell Barman Primary Care Provider: Lorrene Reid Other Clinician: Massie Kluver Referring Provider: Lorrene Reid Treating Provider/Extender: Tito Dine in Treatment: 3 Verbal / Phone Orders: No Diagnosis Coding Follow-up Appointments Wound #1 Right,Medial Lower Leg o Return Appointment in 1 week. Bathing/ Shower/ Hygiene o May shower with wound dressing protected with water repellent cover or cast protector. Anesthetic (Use 'Patient Medications' Section for Anesthetic Order Entry) o Lidocaine applied to wound bed Edema Control - Lymphedema / Segmental Compressive Device / Other o Elevate, Exercise Daily and Avoid Standing for Long Periods of Time. o Elevate legs to the level of the heart and pump ankles as often as possible o Elevate leg(s) parallel to the floor when sitting. Additional Orders / Instructions o Follow Nutritious Diet and Increase Protein Intake Wound Treatment Wound #1 - Lower Leg Wound Laterality: Right,  Medial Cleanser: Soap and Water 1 x Per Week/30 Days Discharge Instructions: Gently cleanse wound with antibacterial soap, rinse and pat dry prior to dressing wounds Cleanser: Wound Cleanser 1 x Per Week/30 Days Discharge Instructions: Wash your hands  with soap and water. Remove old dressing, discard into plastic bag and place into trash. Cleanse the wound with Wound Cleanser prior to applying a clean dressing using gauze sponges, not tissues or cotton balls. Do not scrub or use excessive force. Pat dry using gauze sponges, not tissue or cotton balls. Primary Dressing: Xeroform 5x9-HBD (in/in) 1 x Per Week/30 Days Discharge Instructions: Apply Xeroform 5x9-HBD (in/in) as directed Double layer Secondary Dressing: Zetuvit Plus 4x8 (in/in) 1 x Per Week/30 Days Compression Wrap: 3-LAYER WRAP - Profore Lite LF 3 Multilayer Compression Bandaging System 1 x Per Week/30 Days Discharge Instructions: Apply 3 multi-layer wrap as prescribed. Electronic Signature(s) Signed: 11/22/2021 4:17:57 PM By: Linton Ham MD Signed: 11/24/2021 8:24:32 AM By: Massie Kluver Entered By: Massie Kluver on 11/22/2021 08:39:45 ELAZAR, ARGABRIGHT ([id number]) -------------------------------------------------------------------------------- Problem List Details Patient Name: [name] Date of Service: 11/22/2021 8:15 AM Medical Record Number: [id number] Patient Account Number: [id number] Date of Birth/Sex: [birth year] (88 y.o. M) Treating RN: Cornell Barman Primary Care Provider: Lorrene Reid Other Clinician: Massie Kluver Referring Provider: Lorrene Reid Treating Provider/Extender: Tito Dine in Treatment: 3 Active Problems ICD-10 Encounter Code Description Active Date MDM Diagnosis W55.22XA Struck by cow, initial encounter 11/01/2021 No Yes L97.812 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r 11/01/2021 No Yes exposed Inactive Problems ICD-10 Code Description  Active Date Inactive Date L03.115 Cellulitis of right lower limb 11/01/2021 11/01/2021 Resolved Problems Electronic Signature(s) Signed: 11/22/2021 4:17:57 PM By: Linton Ham MD Entered By: Linton Ham on 11/22/2021 08:40:34 Doody, Rachelle Hora ([id number]) -------------------------------------------------------------------------------- Progress Note Details Patient Name: [name] Date of Service: 11/22/2021 8:15 AM Medical Record Number: [id number] Patient Account Number: [id number] Date of Birth/Sex: 1931/04/11 (88 y.o. M) Treating RN: Cornell Barman Primary Care Provider: Lorrene Reid Other Clinician: Massie Kluver Referring Provider: Lorrene Reid Treating Provider/Extender: Tito Dine in Treatment: 3 Subjective History of Present Illness (HPI) 11-01-2021 upon evaluation today patient actually presents for initial inspection here in the clinic of actually been involved in his care to some degree since Thursday of last week. Subsequently the patient has been seen by his primary care provider on Wednesday and Thursday of last week they were start him on Rocephin to try to get infection under control but he still had significant issues with a wound on his right leg where he was trampled by his cow 5 days prior to that point. He tells me that the health went down his leg and subsequently she also stepped on him in the belly/abdomen area. With that being said the patient tells me that he did not go see anyone for 5 days because he was "just being stubborn". Subsequently he ended up eventually going to the doctor and being seen that is when I was contacted and suggested that he really need to go to the ER for further evaluation and treatment. He ended up doing so. They had him on IV antibiotics in the hospital and discharged him on cefadroxil 500 mg 2 times a day for 6 days. He starts that today. With that being said overall he really seems to be doing quite well in my  opinion. The good news is they also did do an ABI while he was in the hospital this showed that he reading on the right of 1.37 with a TBI of 0.80 and on the left 1.26 with a TBI of 0.92. Medihoney was initiated as treatment and that has helped to clean up  the wound quite significantly in the interim since I saw the first picture that was taken on Thursday of last week that is in epic. Fortunately the patient really has no other major medical problems other than the fact that he does have some issues here with leg swelling this is 1+ pitting but otherwise he is doing quite well in my opinion. 11-08-2021 upon evaluation today patient appears to be doing well with regard to his wound. Has been tolerating the dressing changes without complication. Fortunately there does not appear to be any signs of active infection locally or systemically at this time. No fevers, chills, nausea, vomiting, or diarrhea. 11-15-2021 upon evaluation patient's wound is actually showing signs of excellent improvement. Fortunately I do not see any evidence of infection locally or systemically which is great news and overall I am extremely pleased I do believe that the patient is really doing quite well here today. I think that he is on the right track to complete resolution of the wound is significantly smaller compared to where we started. No sharp debridement necessary today. 7/24; sizable laceration wound on the right medial lower leg. Using Xeroform/the tube and in 3 layer compression. He is doing well. Just superior part still has some small open areas but the most part of this is epithelializing and may be healed by next week or the week after. The lower part of the wound had a nonviable surface Objective Constitutional Patient is hypertensive.. Pulse regular and within target range for patient.Marland Kitchen Respirations regular, non-labored and within target range.Marland Kitchen appears in no distress. Vitals Time Taken: 8:17 AM, Height: 68  in, Weight: 130 lbs, BMI: 19.8, Temperature: 97.8 F, Pulse: 66 bpm, Respiratory Rate: 18 breaths/min, Blood Pressure: 167/70 mmHg. Cardiovascular Pedal pulses palpable. No edema in the right leg.. General Notes: Wound exam; superior part of the long laceration on the right medial leg is almost fully epithelialized only several small areas remain on epithelialize. There is no evidence of surrounding infection. Distally hyper granulated granulation with adherent nonviable surface. I used a #5 curette to remove this hemostasis with direct pressure. Integumentary (Hair, Skin) Wound #1 status is Open. Original cause of wound was Trauma. The date acquired was: 10/22/2021. The wound has been in treatment 3 weeks. The wound is located on the Right,Medial Lower Leg. The wound measures 8cm length x 2.2cm width x 0.1cm depth; 13.823cm^2 area and 1.382cm^3 volume. There is Fat Layer (Subcutaneous Tissue) exposed. There is a large amount of serosanguineous drainage noted. The wound margin is flat and intact. There is small (1-33%) red, hyper - granulation within the wound bed. There is a large (67-100%) amount of necrotic tissue within the wound bed including Eschar and Adherent Slough. JAKYRIE, TOTHEROW ([id number]) Assessment Active Problems ICD-10 Struck by cow, initial encounter 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r exposed Procedures Wound #1 Pre-procedure diagnosis of Wound #1 is a Trauma, Other located on the Right,Medial Lower Leg . There was a Excisional Skin/Subcutaneous Tissue Debridement with a total area of 17.6 sq cm performed by Ricard Dillon, MD. With the following instrument(s): Curette to remove Viable and Non-Viable tissue/material. Material removed includes Subcutaneous Tissue and Skin: Epidermis and. A time out was conducted at 08:36, prior to the start of the procedure. A Minimum amount of bleeding was controlled with Pressure. The procedure  was tolerated well. Post Debridement Measurements: 8cm length x 2.2cm width x 0.2cm depth; 2.765cm^3 volume. Character of Wound/Ulcer Post Debridement  is stable. Post procedure Diagnosis Wound #1: Same as Pre-Procedure Pre-procedure diagnosis of Wound #1 is a Trauma, Other located on the Right,Medial Lower Leg . There was a Three Layer Compression Therapy Procedure by Massie Kluver. Post procedure Diagnosis Wound #1: Same as Pre-Procedure Notes: patient tolerates wrap well. Plan Follow-up Appointments: Wound #1 Right,Medial Lower Leg: Return Appointment in 1 week. Bathing/ Shower/ Hygiene: May shower with wound dressing protected with water repellent cover or cast protector. Anesthetic (Use 'Patient Medications' Section for Anesthetic Order Entry): Lidocaine applied to wound bed Edema Control - Lymphedema / Segmental Compressive Device / Other: Elevate, Exercise Daily and Avoid Standing for Long Periods of Time. Elevate legs to the level of the heart and pump ankles as often as possible Elevate leg(s) parallel to the floor when sitting. Additional Orders / Instructions: Follow Nutritious Diet and Increase Protein Intake WOUND #1: - Lower Leg Wound Laterality: Right, Medial Cleanser: Soap and Water 1 x Per Week/30 Days Discharge Instructions: Gently cleanse wound with antibacterial soap, rinse and pat dry prior to dressing wounds Cleanser: Wound Cleanser 1 x Per Week/30 Days Discharge Instructions: Wash your hands with soap and water. Remove old dressing, discard into plastic bag and place into trash. Cleanse the wound with Wound Cleanser prior to applying a clean dressing using gauze sponges, not tissues or cotton balls. Do not scrub or use excessive force. Pat dry using gauze sponges, not tissue or cotton balls. Primary Dressing: Xeroform 5x9-HBD (in/in) 1 x Per Week/30 Days Discharge Instructions: Apply Xeroform 5x9-HBD (in/in) as directed Double layer Secondary Dressing: Zetuvit  Plus 4x8 (in/in) 1 x Per Week/30 Days Compression Wrap: 3-LAYER WRAP - Profore Lite LF 3 Multilayer Compression Bandaging System 1 x Per Week/30 Days Discharge Instructions: Apply 3 multi-layer wrap as prescribed. 1. I did not change the primary dressing. The distal part of the laceration is the area that required debridement. Remove slough subcutaneous tissue. Hemostasis with direct pressure. There is no evidence of surrounding infection. Electronic Signature(s) Signed: 11/22/2021 4:17:57 PM By: Linton Ham MD CLYDE, ZARRELLA ([id number]) Entered By: Linton Ham on 11/22/2021 08:47:43 DEWELL, MONNIER ([id number]) -------------------------------------------------------------------------------- SuperBill Details Patient Name: [name] Date of Service: 11/22/2021 Medical Record Number: [id number] Patient Account Number: [id number] Date of Birth/Sex: Mar 20, 1931 (88 y.o. M) Treating RN: Cornell Barman Primary Care Provider: Lorrene Reid Other Clinician: Massie Kluver Referring Provider: Lorrene Reid Treating Provider/Extender: Tito Dine in Treatment: 3 Diagnosis Coding ICD-10 Codes Code Description W55.22XA Struck by cow, initial encounter [phone] 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r exposed Facility Procedures CPT4 Code: [id number] Description: 83382 - DEB SUBQ TISSUE 20 SQ CM/< Modifier: Quantity: 1 CPT4 Code: Description: ICD-10 Diagnosis Description N05.397 Non-pressure chronic ulcer of other part of right lower leg with fat lay Modifier: er exposed Quantity: Physician Procedures CPT4 Code: [id number] Description: [place] - WC PHYS SUBQ TISS 20 SQ CM Modifier: Quantity: 1 CPT4 Code: Description: ICD-10 Diagnosis Description X90.240 Non-pressure chronic ulcer of other part of right lower leg with fat lay Modifier: er exposed Quantity: Electronic Signature(s) Signed: 11/22/2021 4:17:57 PM By: Linton Ham  MD Entered By: Linton Ham on 11/22/2021 08:47:54

## 2021-11-25 DIAGNOSIS — L400 Psoriasis vulgaris: Secondary | ICD-10-CM | POA: Diagnosis not present

## 2021-11-25 DIAGNOSIS — Z85828 Personal history of other malignant neoplasm of skin: Secondary | ICD-10-CM | POA: Diagnosis not present

## 2021-11-25 DIAGNOSIS — D485 Neoplasm of uncertain behavior of skin: Secondary | ICD-10-CM | POA: Diagnosis not present

## 2021-11-25 DIAGNOSIS — L988 Other specified disorders of the skin and subcutaneous tissue: Secondary | ICD-10-CM | POA: Diagnosis not present

## 2021-11-29 ENCOUNTER — Encounter: Payer: Medicare HMO | Admitting: Physician Assistant

## 2021-11-29 DIAGNOSIS — W5522XA Struck by cow, initial encounter: Secondary | ICD-10-CM | POA: Diagnosis not present

## 2021-11-29 DIAGNOSIS — I10 Essential (primary) hypertension: Secondary | ICD-10-CM | POA: Diagnosis not present

## 2021-11-29 DIAGNOSIS — L03115 Cellulitis of right lower limb: Secondary | ICD-10-CM | POA: Diagnosis not present

## 2021-11-29 DIAGNOSIS — L97812 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r exposed: Secondary | ICD-10-CM | POA: Diagnosis not present

## 2021-11-29 NOTE — Progress Notes (Signed)
[name], [name] ([id number]) Visit Report for 11/29/2021 Chief Complaint Document Details Patient Name: [name], [name] Date of Service: 11/29/2021 8:15 AM Medical Record Number: [id number] Patient Account Number: [id number] Date of Birth/Sex: Oct 14, 1930 (88 y.o. M) Treating RN: Cornell Barman Primary Care Provider: Lorrene Reid Other Clinician: Massie Kluver Referring Provider: Lorrene Reid Treating Provider/Extender: Skipper Cliche in Treatment: 4 Information Obtained from: Patient Chief Complaint Right LE Ulcer and Cellulitis Electronic Signature(s) Signed: 11/29/2021 8:21:34 AM By: Worthy Keeler PA-C Entered By: Worthy Keeler on 11/29/2021 08:21:34 [name], [name] ([id number]) -------------------------------------------------------------------------------- Problem List Details Patient Name: [name] Date of Service: 11/29/2021 8:15 AM Medical Record Number: [id number] Patient Account Number: [id number] Date of Birth/Sex: 03/25/1931 (88 y.o. M) Treating RN: Cornell Barman Primary Care Provider: Lorrene Reid Other Clinician: Massie Kluver Referring Provider: Lorrene Reid Treating Provider/Extender: Skipper Cliche in Treatment: 4 Active Problems ICD-10 Encounter Code Description Active Date MDM Diagnosis W55.22XA Struck by cow, initial encounter 11/01/2021 No Yes L97.812 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r 11/01/2021 No Yes exposed Inactive Problems ICD-10 Code Description Active Date Inactive Date L03.115 Cellulitis of right lower limb 11/01/2021 11/01/2021 Resolved Problems Electronic Signature(s) Signed: 11/29/2021 8:21:30 AM By: Worthy Keeler PA-C Entered By: Worthy Keeler on 11/29/2021 08:21:30
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
## 2021-12-06 ENCOUNTER — Encounter: Payer: Medicare HMO | Attending: Physician Assistant | Admitting: Physician Assistant

## 2021-12-06 DIAGNOSIS — L03115 Cellulitis of right lower limb: Secondary | ICD-10-CM | POA: Insufficient documentation

## 2021-12-06 DIAGNOSIS — L97812 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r exposed: Secondary | ICD-10-CM | POA: Diagnosis not present

## 2021-12-06 DIAGNOSIS — W5522XA Struck by cow, initial encounter: Secondary | ICD-10-CM | POA: Insufficient documentation

## 2021-12-06 NOTE — Progress Notes (Signed)
[name], [name] ([id number]) Visit Report for 12/06/2021 Arrival Information Details Patient Name: [name], [name] Date of Service: 12/06/2021 8:15 AM Medical Record Number: [id number] Patient Account Number: [id number] Date of Birth/Sex: 16-Apr-1931 (88 y.o. M) Treating RN: Cornell Barman Primary Care Da Authement: Lorrene Reid Other Clinician: Massie Kluver Referring Modean Mccullum: Lorrene Reid Treating Aviela Blundell/Extender: Skipper Cliche in Treatment: 5 Visit Information History Since Last Visit All ordered tests and consults were completed: No Patient Arrived: Ambulatory Added or deleted any medications: No Arrival Time: 08:14 Any new allergies or adverse reactions: No Transfer Assistance: None Had a fall or experienced change in No Patient Requires Transmission-Based Precautions: No activities of daily living that may affect Patient Has Alerts: No risk of falls: Hospitalized since last visit: No Pain Present Now: No Electronic Signature(s) Unsigned Entered ByMassie Kluver on 12/06/2021 08:17:57 Signature(s): Date(s): [name], [name] ([id number]) -------------------------------------------------------------------------------- Pain Assessment Details Patient Name: [name], [name] Date of Service: 12/06/2021 8:15 AM Medical Record Number: [id number] Patient Account Number: [id number] Date of Birth/Sex: 02-May-1931 (88 y.o. M) Treating RN: Cornell Barman Primary Care Giorgio Chabot: Lorrene Reid Other Clinician: Massie Kluver Referring Auryn Paige: Lorrene Reid Treating Lucile Hillmann/Extender: Skipper Cliche in Treatment: 5 Active Problems Location of Pain Severity and Description of Pain Patient Has Paino No Site Locations Pain Management and Medication Current Pain Management: Electronic Signature(s) Unsigned Entered By: Massie Kluver on 12/06/2021 08:23:37 Signature(s): Date(s): [name], [name]  ([id number]) -------------------------------------------------------------------------------- Vitals Details Patient Name: [name] Date of Service: 12/06/2021 8:15 AM Medical Record Number: [id number] Patient Account Number: [id number] Date of Birth/Sex: 07-12-1930 (88 y.o. M) Treating RN: Cornell Barman Primary Care Kian Gamarra: Lorrene Reid Other Clinician: Massie Kluver Referring Neng Albee: Lorrene Reid Treating Moise Friday/Extender: Skipper Cliche in Treatment: 5 Vital Signs Time Taken: 08:18 Temperature (F): 97.9 Height (in): 68 Pulse (bpm): 71 Weight (lbs): 130 Respiratory Rate (breaths/min): 18 Body Mass Index (BMI): 19.8 Blood Pressure (mmHg): 167/91 Reference Range: 80 - 120 mg / dl Electronic Signature(s) Unsigned Entered ByMassie Kluver on 12/06/2021 08:23:09 Signature(s): Date(s):

## 2021-12-06 NOTE — Progress Notes (Addendum)
[name] ([id number]) Visit Report for 12/06/2021 Chief Complaint Document Details Patient Name: [name], [name] Date of Service: 12/06/2021 8:15 AM Medical Record Number: [id number] Patient Account Number: [id number] Date of Birth/Sex: Oct 31, 1930 (88 y.o. M) Treating RN: Cornell Barman Primary Care Provider: Lorrene Reid Other Clinician: Massie Kluver Referring Provider: Lorrene Reid Treating Provider/Extender: Skipper Cliche in Treatment: 5 Information Obtained from: Patient Chief Complaint Right LE Ulcer and Cellulitis Electronic Signature(s) Signed: 12/06/2021 8:13:38 AM By: Worthy Keeler PA-C Entered By: Worthy Keeler on 12/06/2021 08:13:37 DOMENIC, SCHOENBERGER ([id number]) -------------------------------------------------------------------------------- HPI Details Patient Name: [name] Date of Service: 12/06/2021 8:15 AM Medical Record Number: [id number] Patient Account Number: [id number] Date of Birth/Sex: 1931/04/21 (88 y.o. M) Treating RN: Cornell Barman Primary Care Provider: Lorrene Reid Other Clinician: Massie Kluver Referring Provider: Lorrene Reid Treating Provider/Extender: Skipper Cliche in Treatment: 5 History of Present Illness HPI Description: 11-01-2021 upon evaluation today patient actually presents for initial inspection here in the clinic of actually been involved in his care to some degree since Thursday of last week. Subsequently the patient has been seen by his primary care provider on Wednesday and Thursday of last week they were start him on Rocephin to try to get infection under control but he still had significant issues with a wound on his right leg where he was trampled by his cow 5 days prior to that point. He tells me that the health went down his leg and subsequently she also stepped on him in the belly/abdomen area. With that being said the patient tells me that he did not go see anyone for 5 days because he was "just being  stubborn". Subsequently he ended up eventually going to the doctor and being seen that is when I was contacted and suggested that he really need to go to the ER for further evaluation and treatment. He ended up doing so. They had him on IV antibiotics in the hospital and discharged him on cefadroxil 500 mg 2 times a day for 6 days. He starts that today. With that being said overall he really seems to be doing quite well in my opinion. The good news is they also did do an ABI while he was in the hospital this showed that he reading on the right of 1.37 with a TBI of 0.80 and on the left 1.26 with a TBI of 0.92. Medihoney was initiated as treatment and that has helped to clean up the wound quite significantly in the interim since I saw the first picture that was taken on Thursday of last week that is in epic. Fortunately the patient really has no other major medical problems other than the fact that he does have some issues here with leg swelling this is 1+ pitting but otherwise he is doing quite well in my opinion. 11-08-2021 upon evaluation today patient appears to be doing well with regard to his wound. Has been tolerating the dressing changes without complication. Fortunately there does not appear to be any signs of active infection locally or systemically at this time. No fevers, chills, nausea, vomiting, or diarrhea. 11-15-2021 upon evaluation patient's wound is actually showing signs of excellent improvement. Fortunately I do not see any evidence of infection locally or systemically which is great news and overall I am extremely pleased I do believe that the patient is really doing quite well here today. I think that he is on the right track to complete resolution of the wound is significantly smaller  compared to where we started. No sharp debridement necessary today. 7/24; sizable laceration wound on the right medial lower leg. Using Xeroform/the tube and in 3 layer compression. He is doing well.  Just superior part still has some small open areas but the most part of this is epithelializing and may be healed by next week or the week after. The lower part of the wound had a nonviable surface 11-29-2021 upon evaluation today patient is doing well although he does seem to be having some issues with the sticking of the dressings specifically the Xeroform keeping this on for a week at a time. For that reason I do believe it would be best to probably have him change this more frequently at this point his swelling is actually significantly down only reason this was swollen to begin with was from the infection and the inflammation from the injury itself which has resolved. I Minna recommend we probably continue Tubigrip but at the same time I think it may better to have him change this 3 times a week at home. The patient voiced understanding currently. 12-06-2021 upon evaluation today patient's wound still showed signs of being somewhat hypergranular despite last week's treatment with the silver nitrate. I do think that we may need to switch over to utilizing [facility] to see if this will do better. I note will definitely help keep things a little bit drier which should do well. Fortunately I do not see any evidence of active infection locally or systemically at this time. Electronic Signature(s) Signed: 12/06/2021 9:18:36 AM By: Worthy Keeler PA-C Entered By: Worthy Keeler on 12/06/2021 09:18:36 HERIBERTO, STMARTIN ([id number]) -------------------------------------------------------------------------------- Otelia Sergeant TISS Details Patient Name: [name] Date of Service: 12/06/2021 8:15 AM Medical Record Number: [id number] Patient Account Number: [id number] Date of Birth/Sex: [birth year] (88 y.o. M) Treating RN: Cornell Barman Primary Care Provider: Lorrene Reid Other Clinician: Massie Kluver Referring Provider: Lorrene Reid Treating Provider/Extender: Skipper Cliche  in Treatment: 5 Procedure Performed for: Wound #1 Right,Medial Lower Leg Performed By: Clinician Cornell Barman, RN Post Procedure Diagnosis Same as Pre-procedure Notes 1 silver nitrate stick used to right lower leg Electronic Signature(s) Signed: 12/06/2021 4:15:07 PM By: Massie Kluver Entered By: Massie Kluver on 12/06/2021 08:46:07 [name] ([id number]) -------------------------------------------------------------------------------- Physical Exam Details Patient Name: [name] Date of Service: 12/06/2021 8:15 AM Medical Record Number: [id number] Patient Account Number: [id number] Date of Birth/Sex: 1931/03/17 (88 y.o. M) Treating RN: Cornell Barman Primary Care Provider: Lorrene Reid Other Clinician: Massie Kluver Referring Provider: Lorrene Reid Treating Provider/Extender: Skipper Cliche in Treatment: 5 Constitutional Well-nourished and well-hydrated in no acute distress. Respiratory normal breathing without difficulty. Psychiatric this patient is able to make decisions and demonstrates good insight into disease process. Alert and Oriented x 3. pleasant and cooperative. Notes Upon inspection patient's wound bed actually showed signs of good granulation and epithelization at this point. Fortunately I do not see any evidence of infection locally or systemically which is great news and overall very pleased with where we stand today. Electronic Signature(s) Signed: 12/06/2021 9:18:50 AM By: Worthy Keeler PA-C Entered By: Worthy Keeler on 12/06/2021 09:18:50 DESHON, HSIAO ([id number]) -------------------------------------------------------------------------------- Physician Orders Details Patient Name: [name] Date of Service: 12/06/2021 8:15 AM Medical Record Number: [id number] Patient Account Number: [id number] Date of Birth/Sex: 1931/01/21 (88 y.o. M) Treating RN: Cornell Barman Primary Care Provider: Lorrene Reid Other Clinician: Massie Kluver Referring Provider: Lorrene Reid Treating Provider/Extender: Jeri Cos  Weeks in Treatment: 5 Verbal / Phone Orders: No Diagnosis Coding ICD-10 Coding Code Description W55.40XA Struck by cow, initial encounter [phone] 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r exposed Follow-up Appointments Wound #1 Right,Medial Lower Leg o Return Appointment in 1 week. Bathing/ Shower/ Hygiene o Wash wounds with antibacterial soap and water. - dial soap o May shower; gently cleanse wound with antibacterial soap, rinse and pat dry prior to dressing wounds o Other: - change dressing every other day Anesthetic (Use 'Patient Medications' Section for Anesthetic Order Entry) o Lidocaine applied to wound bed Edema Control - Lymphedema / Segmental Compressive Device / Other o Tubigrip single layer applied. - Tubi C o Elevate, Exercise Daily and Avoid Standing for Long Periods of Time. o Elevate legs to the level of the heart and pump ankles as often as possible o Elevate leg(s) parallel to the floor when sitting. Additional Orders / Instructions o Follow Nutritious Diet and Increase Protein Intake Wound Treatment Wound #1 - Lower Leg Wound Laterality: Right, Medial Cleanser: Soap and Water 3 x Per Day/30 Days Discharge Instructions: Gently cleanse wound with antibacterial soap, rinse and pat dry prior to dressing wounds Cleanser: Wound Cleanser 3 x Per Day/30 Days Discharge Instructions: Wash your hands with soap and water. Remove old dressing, discard into plastic bag and place into trash. Cleanse the wound with Wound Cleanser prior to applying a clean dressing using gauze sponges, not tissues or cotton balls. Do not scrub or use excessive force. Pat dry using gauze sponges, not tissue or cotton balls. Primary Dressing: Hydrofera Blue Ready Transfer Foam, 2.5x2.5 (in/in) 3 x Per Day/30 Days Discharge Instructions: Apply Hydrofera Blue Ready to  wound bed as directed Secondary Dressing: ABD Pad 5x9 (in/in) 3 x Per Day/30 Days Discharge Instructions: Cover with ABD pad Secured With: Conform 4'' - Conforming Stretch Gauze Bandage 4x75 (in/in) 3 x Per Day/30 Days Discharge Instructions: Apply as directed Secured With: Tubigrip Size C, 2.75x10 (in/yd) 3 x Per Day/30 Days Discharge Instructions: Apply 3 Tubigrip C 3-finger-widths below knee to base of toes to secure dressing and/or for swelling. Electronic Signature(s) Signed: 12/06/2021 4:15:07 PM By: Massie Kluver Signed: 12/06/2021 4:15:14 PM By: Haze Rushing, Giancarlo JMarland Kitchen ([id number]) Entered By: Massie Kluver on 12/06/2021 08:51:11 LULA, MICHAUX ([id number]) -------------------------------------------------------------------------------- Problem List Details Patient Name: [name] Date of Service: 12/06/2021 8:15 AM Medical Record Number: [id number] Patient Account Number: [id number] Date of Birth/Sex: 1931-04-11 (88 y.o. M) Treating RN: Cornell Barman Primary Care Provider: Lorrene Reid Other Clinician: Massie Kluver Referring Provider: Lorrene Reid Treating Provider/Extender: Skipper Cliche in Treatment: 5 Active Problems ICD-10 Encounter Code Description Active Date MDM Diagnosis W55.22XA Struck by cow, initial encounter 11/01/2021 No Yes L97.812 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r 11/01/2021 No Yes exposed Inactive Problems ICD-10 Code Description Active Date Inactive Date L03.115 Cellulitis of right lower limb 11/01/2021 11/01/2021 Resolved Problems Electronic Signature(s) Signed: 12/06/2021 8:13:33 AM By: Worthy Keeler PA-C Entered By: Worthy Keeler on 12/06/2021 08:13:33 [name] ([id number]) -------------------------------------------------------------------------------- Progress Note Details Patient Name: [name] Date of Service: 12/06/2021 8:15 AM Medical Record Number:  [id number] Patient Account Number: [id number] Date of Birth/Sex: [birth year] (88 y.o. M) Treating RN: Cornell Barman Primary Care Provider: Lorrene Reid Other Clinician: Massie Kluver Referring Provider: Lorrene Reid Treating Provider/Extender: Skipper Cliche in Treatment: 5 Subjective Chief Complaint Information obtained from Patient Right LE Ulcer and  Cellulitis History of Present Illness (HPI) 11-01-2021 upon evaluation today patient actually presents for initial inspection here in the clinic of actually been involved in his care to some degree since Thursday of last week. Subsequently the patient has been seen by his primary care provider on Wednesday and Thursday of last week they were start him on Rocephin to try to get infection under control but he still had significant issues with a wound on his right leg where he was trampled by his cow 5 days prior to that point. He tells me that the health went down his leg and subsequently she also stepped on him in the belly/abdomen area. With that being said the patient tells me that he did not go see anyone for 5 days because he was "just being stubborn". Subsequently he ended up eventually going to the doctor and being seen that is when I was contacted and suggested that he really need to go to the ER for further evaluation and treatment. He ended up doing so. They had him on IV antibiotics in the hospital and discharged him on cefadroxil 500 mg 2 times a day for 6 days. He starts that today. With that being said overall he really seems to be doing quite well in my opinion. The good news is they also did do an ABI while he was in the hospital this showed that he reading on the right of 1.37 with a TBI of 0.80 and on the left 1.26 with a TBI of 0.92. Medihoney was initiated as treatment and that has helped to clean up the wound quite significantly in the interim since I saw the first picture that was taken on Thursday of last week that is in  epic. Fortunately the patient really has no other major medical problems other than the fact that he does have some issues here with leg swelling this is 1+ pitting but otherwise he is doing quite well in my opinion. 11-08-2021 upon evaluation today patient appears to be doing well with regard to his wound. Has been tolerating the dressing changes without complication. Fortunately there does not appear to be any signs of active infection locally or systemically at this time. No fevers, chills, nausea, vomiting, or diarrhea. 11-15-2021 upon evaluation patient's wound is actually showing signs of excellent improvement. Fortunately I do not see any evidence of infection locally or systemically which is great news and overall I am extremely pleased I do believe that the patient is really doing quite well here today. I think that he is on the right track to complete resolution of the wound is significantly smaller compared to where we started. No sharp debridement necessary today. 7/24; sizable laceration wound on the right medial lower leg. Using Xeroform/the tube and in 3 layer compression. He is doing well. Just superior part still has some small open areas but the most part of this is epithelializing and may be healed by next week or the week after. The lower part of the wound had a nonviable surface 11-29-2021 upon evaluation today patient is doing well although he does seem to be having some issues with the sticking of the dressings specifically the Xeroform keeping this on for a week at a time. For that reason I do believe it would be best to probably have him change this more frequently at this point his swelling is actually significantly down only reason this was swollen to begin with was from the infection and the inflammation from the injury itself  which has resolved. I Minna recommend we probably continue Tubigrip but at the same time I think it may better to have him change this 3 times a week  at home. The patient voiced understanding currently. 12-06-2021 upon evaluation today patient's wound still showed signs of being somewhat hypergranular despite last week's treatment with the silver nitrate. I do think that we may need to switch over to utilizing [facility] to see if this will do better. I note will definitely help keep things a little bit drier which should do well. Fortunately I do not see any evidence of active infection locally or systemically at this time. Objective Constitutional Well-nourished and well-hydrated in no acute distress. Vitals Time Taken: 8:18 AM, Height: 68 in, Weight: 130 lbs, BMI: 19.8, Temperature: 97.9 F, Pulse: 71 bpm, Respiratory Rate: 18 breaths/min, Blood Pressure: 167/91 mmHg. Respiratory normal breathing without difficulty. Psychiatric KAITLYN, SKOWRON. ([id number]) this patient is able to make decisions and demonstrates good insight into disease process. Alert and Oriented x 3. pleasant and cooperative. General Notes: Upon inspection patient's wound bed actually showed signs of good granulation and epithelization at this point. Fortunately I do not see any evidence of infection locally or systemically which is great news and overall very pleased with where we stand today. Integumentary (Hair, Skin) Wound #1 status is Open. Original cause of wound was Trauma. The date acquired was: 10/22/2021. The wound has been in treatment 5 weeks. The wound is located on the Right,Medial Lower Leg. The wound measures 12.4cm length x 2cm width x 0.1cm depth; 19.478cm^2 area and 1.948cm^3 volume. There is Fat Layer (Subcutaneous Tissue) exposed. There is a small amount of serosanguineous drainage noted. The wound margin is flat and intact. There is large (67-100%) red, hyper - granulation within the wound bed. There is no necrotic tissue within the wound bed. Assessment Active Problems ICD-10 Struck by cow, initial encounter Non-pressure chronic ulcer of  other part of right lower leg with fat layer exposed Procedures Wound #1 Pre-procedure diagnosis of Wound #1 is a Trauma, Other located on the Right,Medial Lower Leg . An CHEM CAUT GRANULATION TISS procedure was performed by Cornell Barman, RN. Post procedure Diagnosis Wound #1: Same as Pre-Procedure Notes: 1 silver nitrate stick used to right lower leg Plan Follow-up Appointments: Wound #1 Right,Medial Lower Leg: Return Appointment in 1 week. Bathing/ Shower/ Hygiene: Wash wounds with antibacterial soap and water. - dial soap May shower; gently cleanse wound with antibacterial soap, rinse and pat dry prior to dressing wounds Other: - change dressing every other day Anesthetic (Use 'Patient Medications' Section for Anesthetic Order Entry): Lidocaine applied to wound bed Edema Control - Lymphedema / Segmental Compressive Device / Other: Tubigrip single layer applied. - Tubi C Elevate, Exercise Daily and Avoid Standing for Long Periods of Time. Elevate legs to the level of the heart and pump ankles as often as possible Elevate leg(s) parallel to the floor when sitting. Additional Orders / Instructions: Follow Nutritious Diet and Increase Protein Intake WOUND #1: - Lower Leg Wound Laterality: Right, Medial Cleanser: Soap and Water 3 x Per Day/30 Days Discharge Instructions: Gently cleanse wound with antibacterial soap, rinse and pat dry prior to dressing wounds Cleanser: Wound Cleanser 3 x Per Day/30 Days Discharge Instructions: Wash your hands with soap and water. Remove old dressing, discard into plastic bag and place into trash. Cleanse the wound with Wound Cleanser prior to applying a clean dressing using gauze sponges, not tissues or cotton balls. Do not scrub  or use excessive force. Pat dry using gauze sponges, not tissue or cotton balls. Primary Dressing: Hydrofera Blue Ready Transfer Foam, 2.5x2.5 (in/in) 3 x Per Day/30 Days Discharge Instructions: Apply Hydrofera Blue Ready to  wound bed as directed Secondary Dressing: ABD Pad 5x9 (in/in) 3 x Per Day/30 Days Discharge Instructions: Cover with ABD pad Secured With: Conform 4'' - Conforming Stretch Gauze Bandage 4x75 (in/in) 3 x Per Day/30 Days Discharge Instructions: Apply as directed Secured With: Tubigrip Size C, 2.75x10 (in/yd) 3 x Per Day/30 Days Discharge Instructions: Apply 3 Tubigrip C 3-finger-widths below knee to base of toes to secure dressing and/or for swelling. SRIHARI, SHELLHAMMER. ([id number]) 1. Would recommend currently that we going to continue with wound care measures as before and the patient is in agreement with plan this includes the use of the [facility] which will be the 1 change everything else will stay the same. Procedure we will continue with the Tubigrip. 3. He is also can continue to elevate his leg is much as possible to keep edema under good control. We will see patient back for reevaluation in 1 week here in the clinic. If anything worsens or changes patient will contact our office for additional recommendations. Electronic Signature(s) Signed: 12/06/2021 9:19:16 AM By: Worthy Keeler PA-C Entered By: Worthy Keeler on 12/06/2021 09:19:16 YUKIO, BISPING ([id number]) -------------------------------------------------------------------------------- SuperBill Details Patient Name: [name] Date of Service: 12/06/2021 Medical Record Number: [id number] Patient Account Number: [id number] Date of Birth/Sex: 02-04-1931 (88 y.o. M) Treating RN: Cornell Barman Primary Care Provider: Lorrene Reid Other Clinician: Massie Kluver Referring Provider: Lorrene Reid Treating Provider/Extender: Skipper Cliche in Treatment: 5 Diagnosis Coding ICD-10 Codes Code Description W55.22XA Struck by cow, initial encounter [phone] 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r exposed Facility Procedures CPT4 Code: [id number] Description: [facility] TISS Modifier: Quantity: 1 CPT4 Code: Description: ICD-10 Diagnosis Description A07.622 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 Modifier: r exposed Quantity: Physician Procedures CPT4 Code: 6333545 Description: 62563 - WC PHYS CHEM CAUT GRAN TISSUE Modifier: Quantity: 1 CPT4 Code: Description: ICD-10 Diagnosis Description S93.734 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 Modifier: r exposed Quantity: Electronic Signature(s) Signed: 12/06/2021 9:19:41 AM By: Worthy Keeler PA-C Entered By: Worthy Keeler on 12/06/2021 09:19:41

## 2021-12-08 ENCOUNTER — Other Ambulatory Visit: Payer: Self-pay | Admitting: Internal Medicine

## 2021-12-08 DIAGNOSIS — J4540 Moderate persistent asthma, uncomplicated: Secondary | ICD-10-CM

## 2021-12-13 ENCOUNTER — Encounter: Payer: Medicare HMO | Admitting: Physician Assistant

## 2021-12-13 DIAGNOSIS — L97812 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r exposed: Secondary | ICD-10-CM | POA: Diagnosis not present

## 2021-12-13 DIAGNOSIS — W5522XA Struck by cow, initial encounter: Secondary | ICD-10-CM | POA: Diagnosis not present

## 2021-12-13 DIAGNOSIS — L03115 Cellulitis of right lower limb: Secondary | ICD-10-CM | POA: Diagnosis not present

## 2021-12-15 DIAGNOSIS — H02112 Cicatricial ectropion of right lower eyelid: Secondary | ICD-10-CM | POA: Diagnosis not present

## 2021-12-15 DIAGNOSIS — Z961 Presence of intraocular lens: Secondary | ICD-10-CM | POA: Diagnosis not present

## 2021-12-15 DIAGNOSIS — H401131 Primary open-angle glaucoma, bilateral, mild stage: Secondary | ICD-10-CM | POA: Diagnosis not present
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
## 2021-12-16 NOTE — Progress Notes (Addendum)
NOLAND, PIZANO ([id number]) Visit Report for 12/13/2021 Chief Complaint Document Details Patient Name: MASOUD, NYCE Date of Service: 12/13/2021 8:15 AM Medical Record Number: [id number] Patient Account Number: [id number] Date of Birth/Sex: 1931-01-13 (88 y.o. M) Treating RN: Cornell Barman Primary Care Provider: Lorrene Reid Other Clinician: Massie Kluver Referring Provider: Lorrene Reid Treating Provider/Extender: Skipper Cliche in Treatment: 6 Information Obtained from: Patient Chief Complaint Right LE Ulcer and Cellulitis Electronic Signature(s) Signed: 12/13/2021 8:27:36 AM By: Worthy Keeler PA-C Entered By: Worthy Keeler on 12/13/2021 08:27:36 JERMERY, CARATACHEA ([id number]) -------------------------------------------------------------------------------- HPI Details Patient Name: [name] Date of Service: 12/13/2021 8:15 AM Medical Record Number: [id number] Patient Account Number: [id number] Date of Birth/Sex: Sep 21, 1930 (88 y.o. M) Treating RN: Cornell Barman Primary Care Provider: Lorrene Reid Other Clinician: Massie Kluver Referring Provider: Lorrene Reid Treating Provider/Extender: Skipper Cliche in Treatment: 6 History of Present Illness HPI Description: 11-01-2021 upon evaluation today patient actually presents for initial inspection here in the clinic of actually been involved in his care to some degree since Thursday of last week. Subsequently the patient has been seen by his primary care provider on Wednesday and Thursday of last week they were start him on Rocephin to try to get infection under control but he still had significant issues with a wound on his right leg where he was trampled by his cow 5 days prior to that point. He tells me that the health went down his leg and subsequently she also stepped on him in the belly/abdomen area. With that being said the patient tells me that he did not go see anyone for 5 days because he was "just  being stubborn". Subsequently he ended up eventually going to the doctor and being seen that is when I was contacted and suggested that he really need to go to the ER for further evaluation and treatment. He ended up doing so. They had him on IV antibiotics in the hospital and discharged him on cefadroxil 500 mg 2 times a day for 6 days. He starts that today. With that being said overall he really seems to be doing quite well in my opinion. The good news is they also did do an ABI while he was in the hospital this showed that he reading on the right of 1.37 with a TBI of 0.80 and on the left 1.26 with a TBI of 0.92. Medihoney was initiated as treatment and that has helped to clean up the wound quite significantly in the interim since I saw the first picture that was taken on Thursday of last week that is in epic. Fortunately the patient really has no other major medical problems other than the fact that he does have some issues here with leg swelling this is 1+ pitting but otherwise he is doing quite well in my opinion. 11-08-2021 upon evaluation today patient appears to be doing well with regard to his wound. Has been tolerating the dressing changes without complication. Fortunately there does not appear to be any signs of active infection locally or systemically at this time. No fevers, chills, nausea, vomiting, or diarrhea. 11-15-2021 upon evaluation patient's wound is actually showing signs of excellent improvement. Fortunately I do not see any evidence of infection locally or systemically which is great news and overall I am extremely pleased I do believe that the patient is really doing quite well here today. I think that he is on the right track to complete resolution of the wound is significantly smaller  compared to where we started. No sharp debridement necessary today. 7/24; sizable laceration wound on the right medial lower leg. Using Xeroform/the tube and in 3 layer compression. He is doing  well. Just superior part still has some small open areas but the most part of this is epithelializing and may be healed by next week or the week after. The lower part of the wound had a nonviable surface 11-29-2021 upon evaluation today patient is doing well although he does seem to be having some issues with the sticking of the dressings specifically the Xeroform keeping this on for a week at a time. For that reason I do believe it would be best to probably have him change this more frequently at this point his swelling is actually significantly down only reason this was swollen to begin with was from the infection and the inflammation from the injury itself which has resolved. I Minna recommend we probably continue Tubigrip but at the same time I think it may better to have him change this 3 times a week at home. The patient voiced understanding currently. 12-06-2021 upon evaluation today patient's wound still showed signs of being somewhat hypergranular despite last week's treatment with the silver nitrate. I do think that we may need to switch over to utilizing [facility] to see if this will do better. I note will definitely help keep things a little bit drier which should do well. Fortunately I do not see any evidence of active infection locally or systemically at this time. 12-13-2021 upon evaluation today patient appears to be doing well currently in regard to his wound he has been tolerating the dressing changes without complication. Fortunately there does not appear to be any evidence of infection this does seem to be drying up and I am very pleased in that regard. I do not see any evidence of infection locally or systemically at this time. Electronic Signature(s) Signed: 12/13/2021 8:35:19 AM By: Worthy Keeler PA-C Entered By: Worthy Keeler on 12/13/2021 08:35:19 BRENNER, VISCONTI ([id number]) -------------------------------------------------------------------------------- Physical  Exam Details Patient Name: [name] Date of Service: 12/13/2021 8:15 AM Medical Record Number: [id number] Patient Account Number: [id number] Date of Birth/Sex: 1930/05/06 (88 y.o. M) Treating RN: Cornell Barman Primary Care Provider: Lorrene Reid Other Clinician: Massie Kluver Referring Provider: Lorrene Reid Treating Provider/Extender: Skipper Cliche in Treatment: 6 Constitutional Well-nourished and well-hydrated in no acute distress. Respiratory normal breathing without difficulty. Psychiatric this patient is able to make decisions and demonstrates good insight into disease process. Alert and Oriented x 3. pleasant and cooperative. Notes Upon inspection patient's wound bed actually showed signs of good granulation and epithelization at this point. Fortunately I do not see anything that seems to be worsening which is great news and overall I am extremely pleased with where we stand currently. Electronic Signature(s) Signed: 12/13/2021 8:35:35 AM By: Worthy Keeler PA-C Entered By: Worthy Keeler on 12/13/2021 08:35:35 CORLISS, LAMARTINA ([id number]) -------------------------------------------------------------------------------- Physician Orders Details Patient Name: [name] Date of Service: 12/13/2021 8:15 AM Medical Record Number: [id number] Patient Account Number: [id number] Date of Birth/Sex: [birth year] (88 y.o. M) Treating RN: Cornell Barman Primary Care Provider: Lorrene Reid Other Clinician: Massie Kluver Referring Provider: Lorrene Reid Treating Provider/Extender: Skipper Cliche in Treatment: 6 Verbal / Phone Orders: No Diagnosis Coding ICD-10 Coding Code Description W55.92XA Struck by cow, initial encounter [phone] 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r exposed Follow-up Appointments Wound #1 Right,Medial Lower  Leg o Return Appointment in 1 week. Bathing/ Shower/ Hygiene o Wash wounds with  antibacterial soap and water. - dial soap o May shower; gently cleanse wound with antibacterial soap, rinse and pat dry prior to dressing wounds o Other: - change dressing every other day Anesthetic (Use 'Patient Medications' Section for Anesthetic Order Entry) o Lidocaine applied to wound bed Edema Control - Lymphedema / Segmental Compressive Device / Other o Tubigrip single layer applied. - Tubi C o Elevate, Exercise Daily and Avoid Standing for Long Periods of Time. o Elevate legs to the level of the heart and pump ankles as often as possible o Elevate leg(s) parallel to the floor when sitting. Additional Orders / Instructions o Follow Nutritious Diet and Increase Protein Intake Wound Treatment Wound #1 - Lower Leg Wound Laterality: Right, Medial Cleanser: Soap and Water 3 x Per Day/30 Days Discharge Instructions: Gently cleanse wound with antibacterial soap, rinse and pat dry prior to dressing wounds Cleanser: Wound Cleanser 3 x Per Day/30 Days Discharge Instructions: Wash your hands with soap and water. Remove old dressing, discard into plastic bag and place into trash. Cleanse the wound with Wound Cleanser prior to applying a clean dressing using gauze sponges, not tissues or cotton balls. Do not scrub or use excessive force. Pat dry using gauze sponges, not tissue or cotton balls. Topical: Desitin Maximum Strength Ointment, 1 (oz) tube 3 x Per Day/30 Days Discharge Instructions: apply to skin around wound Primary Dressing: Hydrofera Blue Ready Transfer Foam, 2.5x2.5 (in/in) 3 x Per Day/30 Days Discharge Instructions: Apply Hydrofera Blue Ready to wound bed as directed Secondary Dressing: ABD Pad 5x9 (in/in) 3 x Per Day/30 Days Discharge Instructions: Cover with ABD pad Secured With: Conform 4'' - Conforming Stretch Gauze Bandage 4x75 (in/in) 3 x Per Day/30 Days Discharge Instructions: Apply as directed Secured With: Tubigrip Size C, 2.75x10 (in/yd) 3 x Per Day/30  Days Discharge Instructions: Apply 3 Tubigrip C 3-finger-widths below knee to base of toes to secure dressing and/or for swelling. XAVIER, MUNGER ([id number]) Electronic Signature(s) Signed: 12/13/2021 4:45:05 PM By: Worthy Keeler PA-C Signed: 12/15/2021 1:47:09 PM By: Massie Kluver Entered By: Massie Kluver on 12/13/2021 08:32:20 JADD, GASIOR ([id number]) -------------------------------------------------------------------------------- Problem List Details Patient Name: [name] Date of Service: 12/13/2021 8:15 AM Medical Record Number: [id number] Patient Account Number: [id number] Date of Birth/Sex: [birth year] (88 y.o. M) Treating RN: Cornell Barman Primary Care Provider: Lorrene Reid Other Clinician: Massie Kluver Referring Provider: Lorrene Reid Treating Provider/Extender: Skipper Cliche in Treatment: 6 Active Problems ICD-10 Encounter Code Description Active Date MDM Diagnosis W55.22XA Struck by cow, initial encounter 11/01/2021 No Yes L97.812 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r 11/01/2021 No Yes exposed Inactive Problems ICD-10 Code Description Active Date Inactive Date L03.115 Cellulitis of right lower limb 11/01/2021 11/01/2021 Resolved Problems Electronic Signature(s) Signed: 12/13/2021 8:27:33 AM By: Worthy Keeler PA-C Entered By: Worthy Keeler on 12/13/2021 08:27:33 Laforte, Rachelle Hora ([id number]) -------------------------------------------------------------------------------- Progress Note Details Patient Name: [name] Date of Service: 12/13/2021 8:15 AM Medical Record Number: [id number] Patient Account Number: [id number] Date of Birth/Sex: 06/27/1930 (88 y.o. M) Treating RN: Cornell Barman Primary Care Provider: Lorrene Reid Other Clinician: Massie Kluver Referring Provider: Lorrene Reid Treating Provider/Extender: Skipper Cliche in Treatment: 6 Subjective Chief Complaint Information  obtained from Patient Right LE Ulcer and Cellulitis History of Present Illness (HPI) 11-01-2021 upon evaluation today patient actually presents for initial inspection here in the clinic of  actually been involved in his care to some degree since Thursday of last week. Subsequently the patient has been seen by his primary care provider on Wednesday and Thursday of last week they were start him on Rocephin to try to get infection under control but he still had significant issues with a wound on his right leg where he was trampled by his cow 5 days prior to that point. He tells me that the health went down his leg and subsequently she also stepped on him in the belly/abdomen area. With that being said the patient tells me that he did not go see anyone for 5 days because he was "just being stubborn". Subsequently he ended up eventually going to the doctor and being seen that is when I was contacted and suggested that he really need to go to the ER for further evaluation and treatment. He ended up doing so. They had him on IV antibiotics in the hospital and discharged him on cefadroxil 500 mg 2 times a day for 6 days. He starts that today. With that being said overall he really seems to be doing quite well in my opinion. The good news is they also did do an ABI while he was in the hospital this showed that he reading on the right of 1.37 with a TBI of 0.80 and on the left 1.26 with a TBI of 0.92. Medihoney was initiated as treatment and that has helped to clean up the wound quite significantly in the interim since I saw the first picture that was taken on Thursday of last week that is in epic. Fortunately the patient really has no other major medical problems other than the fact that he does have some issues here with leg swelling this is 1+ pitting but otherwise he is doing quite well in my opinion. 11-08-2021 upon evaluation today patient appears to be doing well with regard to his wound. Has been  tolerating the dressing changes without complication. Fortunately there does not appear to be any signs of active infection locally or systemically at this time. No fevers, chills, nausea, vomiting, or diarrhea. 11-15-2021 upon evaluation patient's wound is actually showing signs of excellent improvement. Fortunately I do not see any evidence of infection locally or systemically which is great news and overall I am extremely pleased I do believe that the patient is really doing quite well here today. I think that he is on the right track to complete resolution of the wound is significantly smaller compared to where we started. No sharp debridement necessary today. 7/24; sizable laceration wound on the right medial lower leg. Using Xeroform/the tube and in 3 layer compression. He is doing well. Just superior part still has some small open areas but the most part of this is epithelializing and may be healed by next week or the week after. The lower part of the wound had a nonviable surface 11-29-2021 upon evaluation today patient is doing well although he does seem to be having some issues with the sticking of the dressings specifically the Xeroform keeping this on for a week at a time. For that reason I do believe it would be best to probably have him change this more frequently at this point his swelling is actually significantly down only reason this was swollen to begin with was from the infection and the inflammation from the injury itself which has resolved. I Minna recommend we probably continue Tubigrip but at the same time I think it may better to  have him change this 3 times a week at home. The patient voiced understanding currently. 12-06-2021 upon evaluation today patient's wound still showed signs of being somewhat hypergranular despite last week's treatment with the silver nitrate. I do think that we may need to switch over to utilizing [facility] to see if this will do better. I note  will definitely help keep things a little bit drier which should do well. Fortunately I do not see any evidence of active infection locally or systemically at this time. 12-13-2021 upon evaluation today patient appears to be doing well currently in regard to his wound he has been tolerating the dressing changes without complication. Fortunately there does not appear to be any evidence of infection this does seem to be drying up and I am very pleased in that regard. I do not see any evidence of infection locally or systemically at this time. Objective Constitutional Well-nourished and well-hydrated in no acute distress. Vitals Time Taken: 8:15 AM, Height: 68 in, Weight: 130 lbs, BMI: 19.8, Temperature: 97.9 F, Pulse: 49 bpm, Respiratory Rate: 18 breaths/min, Blood Pressure: 167/79 mmHg. BREYDON, SENTERS. ([id number]) Respiratory normal breathing without difficulty. Psychiatric this patient is able to make decisions and demonstrates good insight into disease process. Alert and Oriented x 3. pleasant and cooperative. General Notes: Upon inspection patient's wound bed actually showed signs of good granulation and epithelization at this point. Fortunately I do not see anything that seems to be worsening which is great news and overall I am extremely pleased with where we stand currently. Integumentary (Hair, Skin) Wound #1 status is Open. Original cause of wound was Trauma. The date acquired was: 10/22/2021. The wound has been in treatment 6 weeks. The wound is located on the Right,Medial Lower Leg. The wound measures 6.8cm length x 0.6cm width x 0.1cm depth; 3.204cm^2 area and 0.32cm^3 volume. There is Fat Layer (Subcutaneous Tissue) exposed. There is no tunneling or undermining noted. There is a small amount of serosanguineous drainage noted. The wound margin is flat and intact. There is large (67-100%) red, hyper - granulation within the wound bed. There is no necrotic tissue within the wound  bed. Assessment Active Problems ICD-10 Struck by cow, initial encounter 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r exposed Plan Follow-up Appointments: Wound #1 Right,Medial Lower Leg: Return Appointment in 1 week. Bathing/ Shower/ Hygiene: Wash wounds with antibacterial soap and water. - dial soap May shower; gently cleanse wound with antibacterial soap, rinse and pat dry prior to dressing wounds Other: - change dressing every other day Anesthetic (Use 'Patient Medications' Section for Anesthetic Order Entry): Lidocaine applied to wound bed Edema Control - Lymphedema / Segmental Compressive Device / Other: Tubigrip single layer applied. - Tubi C Elevate, Exercise Daily and Avoid Standing for Long Periods of Time. Elevate legs to the level of the heart and pump ankles as often as possible Elevate leg(s) parallel to the floor when sitting. Additional Orders / Instructions: Follow Nutritious Diet and Increase Protein Intake WOUND #1: - Lower Leg Wound Laterality: Right, Medial Cleanser: Soap and Water 3 x Per Day/30 Days Discharge Instructions: Gently cleanse wound with antibacterial soap, rinse and pat dry prior to dressing wounds Cleanser: Wound Cleanser 3 x Per Day/30 Days Discharge Instructions: Wash your hands with soap and water. Remove old dressing, discard into plastic bag and place into trash. Cleanse the wound with Wound Cleanser prior to applying a clean dressing using gauze sponges, not tissues or cotton balls.  Do not scrub or use excessive force. Pat dry using gauze sponges, not tissue or cotton balls. Topical: Desitin Maximum Strength Ointment, 1 (oz) tube 3 x Per Day/30 Days Discharge Instructions: apply to skin around wound Primary Dressing: Hydrofera Blue Ready Transfer Foam, 2.5x2.5 (in/in) 3 x Per Day/30 Days Discharge Instructions: Apply Hydrofera Blue Ready to wound bed as directed Secondary Dressing: ABD Pad 5x9 (in/in) 3 x Per  Day/30 Days Discharge Instructions: Cover with ABD pad Secured With: Conform 4'' - Conforming Stretch Gauze Bandage 4x75 (in/in) 3 x Per Day/30 Days Discharge Instructions: Apply as directed Secured With: Tubigrip Size C, 2.75x10 (in/yd) 3 x Per Day/30 Days Discharge Instructions: Apply 3 Tubigrip C 3-finger-widths below knee to base of toes to secure dressing and/or for swelling. 1. I am going to recommend that we add using a little bit of Desitin right around the wound we will continue with the [facility] which I think is doing quite well. 2. I am also can recommend we have the patient continue to change this every 2 days. 3. I am also going to suggest he continue with the Tubigrip which is doing quite well. I think he will be ready for discharge next week. EDRIK, RUNDLE ([id number]) We will see patient back for reevaluation in 1 week here in the clinic. If anything worsens or changes patient will contact our office for additional recommendations. Electronic Signature(s) Signed: 12/13/2021 8:36:12 AM By: Worthy Keeler PA-C Entered By: Worthy Keeler on 12/13/2021 08:36:12 DAKIN, MADANI ([id number]) -------------------------------------------------------------------------------- SuperBill Details Patient Name: [name] Date of Service: 12/13/2021 Medical Record Number: [id number] Patient Account Number: [id number] Date of Birth/Sex: July 28, 1930 (88 y.o. M) Treating RN: Cornell Barman Primary Care Provider: Lorrene Reid Other Clinician: Massie Kluver Referring Provider: Lorrene Reid Treating Provider/Extender: Skipper Cliche in Treatment: 6 Diagnosis Coding ICD-10 Codes Code Description W55.37XA Struck by cow, initial encounter [phone] 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r exposed Facility Procedures CPT4 Code: [id number] Description: 99213 - WOUND CARE VISIT-LEV 3 EST PT Modifier: Quantity: 1 Physician Procedures CPT4  Code: [id number] Description: 91791 - WC PHYS LEVEL 3 - EST PT Modifier: Quantity: 1 CPT4 Code: Description: ICD-10 Diagnosis Description W55.22XA Struck by cow, initial encounter [phone] Non-pressure chronic ulcer of other part of right lower leg with fat la Modifier: yer exposed Quantity: Electronic Signature(s) Signed: 12/13/2021 8:36:38 AM By: Worthy Keeler PA-C Entered By: Worthy Keeler on 12/13/2021 08:36:38

## 2021-12-20 ENCOUNTER — Encounter: Payer: Medicare HMO | Admitting: Physician Assistant

## 2021-12-20 DIAGNOSIS — W5522XA Struck by cow, initial encounter: Secondary | ICD-10-CM | POA: Diagnosis not present

## 2021-12-20 DIAGNOSIS — L97812 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r exposed: Secondary | ICD-10-CM | POA: Diagnosis not present

## 2021-12-20 DIAGNOSIS — L03115 Cellulitis of right lower limb: Secondary | ICD-10-CM | POA: Diagnosis not present

## 2021-12-22 NOTE — Progress Notes (Signed)
[facility], [facility] ([id number]) Visit Report for 12/20/2021 Chief Complaint Document Details Patient Name: [name], [name] Date of Service: 12/20/2021 8:15 AM Medical Record Number: [id number] Patient Account Number: [id number] Date of Birth/Sex: 02/01/1931 (88 y.o. M) Treating RN: Carlene Coria Primary Care Provider: Lorrene Reid Other Clinician: Massie Kluver Referring Provider: Lorrene Reid Treating Provider/Extender: Skipper Cliche in Treatment: 7 Information Obtained from: Patient Chief Complaint Right LE Ulcer and Cellulitis Electronic Signature(s) Signed: 12/20/2021 8:47:02 AM By: Worthy Keeler PA-C Entered By: Worthy Keeler on 12/20/2021 08:47:01 VINAL, ROSENGRANT ([id number]) -------------------------------------------------------------------------------- HPI Details Patient Name: [name] Date of Service: 12/20/2021 8:15 AM Medical Record Number: [id number] Patient Account Number: [id number] Date of Birth/Sex: 1931/01/19 (88 y.o. M) Treating RN: Carlene Coria Primary Care Provider: Lorrene Reid Other Clinician: Massie Kluver Referring Provider: Lorrene Reid Treating Provider/Extender: Skipper Cliche in Treatment: 7 History of Present Illness HPI Description: 11-01-2021 upon evaluation today patient actually presents for initial inspection here in the clinic of actually been involved in his care to some degree since Thursday of last week. Subsequently the patient has been seen by his primary care provider on Wednesday and Thursday of last week they were start him on Rocephin to try to get infection under control but he still had significant issues with a wound on his right leg where he was trampled by his cow 5 days prior to that point. He tells me that the health went down his leg and subsequently she also stepped on him in the belly/abdomen area. With that being said the patient tells me that he did not go see anyone for 5 days because he  was "just being stubborn". Subsequently he ended up eventually going to the doctor and being seen that is when I was contacted and suggested that he really need to go to the ER for further evaluation and treatment. He ended up doing so. They had him on IV antibiotics in the hospital and discharged him on cefadroxil 500 mg 2 times a day for 6 days. He starts that today. With that being said overall he really seems to be doing quite well in my opinion. The good news is they also did do an ABI while he was in the hospital this showed that he reading on the right of 1.37 with a TBI of 0.80 and on the left 1.26 with a TBI of 0.92. Medihoney was initiated as treatment and that has helped to clean up the wound quite significantly in the interim since I saw the first picture that was taken on Thursday of last week that is in epic. Fortunately the patient really has no other major medical problems other than the fact that he does have some issues here with leg swelling this is 1+ pitting but otherwise he is doing quite well in my opinion. 11-08-2021 upon evaluation today patient appears to be doing well with regard to his wound. Has been tolerating the dressing changes without complication. Fortunately there does not appear to be any signs of active infection locally or systemically at this time. No fevers, chills, nausea, vomiting, or diarrhea. 11-15-2021 upon evaluation patient's wound is actually showing signs of excellent improvement. Fortunately I do not see any evidence of infection locally or systemically which is great news and overall I am extremely pleased I do believe that the patient is really doing quite well here today. I think that he is on the right track to complete resolution of the wound is significantly smaller  compared to where we started. No sharp debridement necessary today. 7/24; sizable laceration wound on the right medial lower leg. Using Xeroform/the tube and in 3 layer compression.  He is doing well. Just superior part still has some small open areas but the most part of this is epithelializing and may be healed by next week or the week after. The lower part of the wound had a nonviable surface 11-29-2021 upon evaluation today patient is doing well although he does seem to be having some issues with the sticking of the dressings specifically the Xeroform keeping this on for a week at a time. For that reason I do believe it would be best to probably have him change this more frequently at this point his swelling is actually significantly down only reason this was swollen to begin with was from the infection and the inflammation from the injury itself which has resolved. I Minna recommend we probably continue Tubigrip but at the same time I think it may better to have him change this 3 times a week at home. The patient voiced understanding currently. 12-06-2021 upon evaluation today patient's wound still showed signs of being somewhat hypergranular despite last week's treatment with the silver nitrate. I do think that we may need to switch over to utilizing [facility] to see if this will do better. I note will definitely help keep things a little bit drier which should do well. Fortunately I do not see any evidence of active infection locally or systemically at this time. 12-13-2021 upon evaluation today patient appears to be doing well currently in regard to his wound he has been tolerating the dressing changes without complication. Fortunately there does not appear to be any evidence of infection this does seem to be drying up and I am very pleased in that regard. I do not see any evidence of infection locally or systemically at this time. 12-20-2021 upon evaluation today patient appears to be completely healed and this is great news I do not see any evidence of infection at this time. Electronic Signature(s) Signed: 12/20/2021 9:23:17 AM By: Worthy Keeler PA-C Entered By:  Worthy Keeler on 12/20/2021 09:23:16 [name], [name] ([id number]) -------------------------------------------------------------------------------- Physical Exam Details Patient Name: [name] Date of Service: 12/20/2021 8:15 AM Medical Record Number: [id number] Patient Account Number: [id number] Date of Birth/Sex: 1930-09-21 (88 y.o. M) Treating RN: Carlene Coria Primary Care Provider: Lorrene Reid Other Clinician: Massie Kluver Referring Provider: Lorrene Reid Treating Provider/Extender: Skipper Cliche in Treatment: 7 Constitutional Well-nourished and well-hydrated in no acute distress. Respiratory normal breathing without difficulty. Psychiatric this patient is able to make decisions and demonstrates good insight into disease process. Alert and Oriented x 3. pleasant and cooperative. Notes Upon inspection patient's wound bed actually showed signs of excellent epithelization I do not think there is anything remaining open at this point. I do believe he wants to protect this whenever he is outside working but otherwise inside I do not believe this is good to be an issue. Electronic Signature(s) Signed: 12/20/2021 9:23:36 AM By: Worthy Keeler PA-C Entered By: Worthy Keeler on 12/20/2021 09:23:36 [name], [name] ([id number]) -------------------------------------------------------------------------------- Physician Orders Details Patient Name: [name] Date of Service: 12/20/2021 8:15 AM Medical Record Number: [id number] Patient Account Number: [id number] Date of Birth/Sex: 1931/01/03 (88 y.o. M) Treating RN: Carlene Coria Primary Care Provider: Lorrene Reid Other Clinician: Massie Kluver Referring Provider: Lorrene Reid Treating Provider/Extender: Skipper Cliche in Treatment: 7 Verbal / Phone Orders:  No Diagnosis Coding Discharge From [facility] Services o Discharge from [place] Treatment Complete - wound healed. please call if any  further issues arise o Wear compression garments daily. Put garments on first thing when you wake up and remove them before bed. - only as needed o Moisturize legs daily after removing compression garments. Bathing/ Shower/ Hygiene o Other: - cover wound and wear pants when outside Edema Control - Lymphedema / Segmental Compressive Device / Other o Tubigrip single layer applied. - wear tubi or compression socks as needed Electronic Signature(s) Signed: 12/21/2021 12:46:44 PM By: Massie Kluver Signed: 12/21/2021 6:22:21 PM By: Worthy Keeler PA-C Entered By: Massie Kluver on 12/20/2021 08:46:55 [name], [name] ([id number]) -------------------------------------------------------------------------------- Problem List Details Patient Name: [name] Date of Service: 12/20/2021 8:15 AM Medical Record Number: [id number] Patient Account Number: [id number] Date of Birth/Sex: 09-23-1930 (88 y.o. M) Treating RN: Carlene Coria Primary Care Provider: Lorrene Reid Other Clinician: Massie Kluver Referring Provider: Lorrene Reid Treating Provider/Extender: Skipper Cliche in Treatment: 7 Active Problems ICD-10 Encounter Code Description Active Date MDM Diagnosis W55.22XA Struck by cow, initial encounter 11/01/2021 No Yes L97.812 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r 11/01/2021 No Yes exposed Inactive Problems ICD-10 Code Description Active Date Inactive Date L03.115 Cellulitis of right lower limb 11/01/2021 11/01/2021 Resolved Problems Electronic Signature(s) Signed: 12/20/2021 8:46:57 AM By: Worthy Keeler PA-C Entered By: Worthy Keeler on 12/20/2021 08:46:57 [name], [name] ([id number]) -------------------------------------------------------------------------------- Progress Note Details Patient Name: [name] Date of Service: 12/20/2021 8:15 AM Medical Record Number: [id number] Patient Account Number: [id number] Date of  Birth/Sex: [birth year] (88 y.o. M) Treating RN: Carlene Coria Primary Care Provider: Lorrene Reid Other Clinician: Massie Kluver Referring Provider: Lorrene Reid Treating Provider/Extender: Skipper Cliche in Treatment: 7 Subjective Chief Complaint Information obtained from Patient Right LE Ulcer and Cellulitis History of Present Illness (HPI) 11-01-2021 upon evaluation today patient actually presents for initial inspection here in the clinic of actually been involved in his care to some degree since Thursday of last week. Subsequently the patient has been seen by his primary care provider on Wednesday and Thursday of last week they were start him on Rocephin to try to get infection under control but he still had significant issues with a wound on his right leg where he was trampled by his cow 5 days prior to that point. He tells me that the health went down his leg and subsequently she also stepped on him in the belly/abdomen area. With that being said the patient tells me that he did not go see anyone for 5 days because he was "just being stubborn". Subsequently he ended up eventually going to the doctor and being seen that is when I was contacted and suggested that he really need to go to the ER for further evaluation and treatment. He ended up doing so. They had him on IV antibiotics in the hospital and discharged him on cefadroxil 500 mg 2 times a day for 6 days. He starts that today. With that being said overall he really seems to be doing quite well in my opinion. The good news is they also did do an ABI while he was in the hospital this showed that he reading on the right of 1.37 with a TBI of 0.80 and on the left 1.26 with a TBI of 0.92. Medihoney was initiated as treatment and that has helped to clean up the wound quite significantly in  the interim since I saw the first picture that was taken on Thursday of last week that is in epic. Fortunately the patient really has no other  major medical problems other than the fact that he does have some issues here with leg swelling this is 1+ pitting but otherwise he is doing quite well in my opinion. 11-08-2021 upon evaluation today patient appears to be doing well with regard to his wound. Has been tolerating the dressing changes without complication. Fortunately there does not appear to be any signs of active infection locally or systemically at this time. No fevers, chills, nausea, vomiting, or diarrhea. 11-15-2021 upon evaluation patient's wound is actually showing signs of excellent improvement. Fortunately I do not see any evidence of infection locally or systemically which is great news and overall I am extremely pleased I do believe that the patient is really doing quite well here today. I think that he is on the right track to complete resolution of the wound is significantly smaller compared to where we started. No sharp debridement necessary today. 7/24; sizable laceration wound on the right medial lower leg. Using Xeroform/the tube and in 3 layer compression. He is doing well. Just superior part still has some small open areas but the most part of this is epithelializing and may be healed by next week or the week after. The lower part of the wound had a nonviable surface 11-29-2021 upon evaluation today patient is doing well although he does seem to be having some issues with the sticking of the dressings specifically the Xeroform keeping this on for a week at a time. For that reason I do believe it would be best to probably have him change this more frequently at this point his swelling is actually significantly down only reason this was swollen to begin with was from the infection and the inflammation from the injury itself which has resolved. I Minna recommend we probably continue Tubigrip but at the same time I think it may better to have him change this 3 times a week at home. The patient voiced understanding  currently. 12-06-2021 upon evaluation today patient's wound still showed signs of being somewhat hypergranular despite last week's treatment with the silver nitrate. I do think that we may need to switch over to utilizing [facility] to see if this will do better. I note will definitely help keep things a little bit drier which should do well. Fortunately I do not see any evidence of active infection locally or systemically at this time. 12-13-2021 upon evaluation today patient appears to be doing well currently in regard to his wound he has been tolerating the dressing changes without complication. Fortunately there does not appear to be any evidence of infection this does seem to be drying up and I am very pleased in that regard. I do not see any evidence of infection locally or systemically at this time. 12-20-2021 upon evaluation today patient appears to be completely healed and this is great news I do not see any evidence of infection at this time. Objective Constitutional Well-nourished and well-hydrated in no acute distress. Vitals Time Taken: 8:20 AM, Height: 68 in, Weight: 130 lbs, BMI: 19.8, Temperature: 97.8 F, Pulse: 55 bpm, Respiratory Rate: 16 breaths/min, [name], Eric J. ([id number]) Blood Pressure: 195/82 mmHg. General Notes: Patient states blood pressure always high in the doctors office Respiratory normal breathing without difficulty. Psychiatric this patient is able to make decisions and demonstrates good insight into disease process. Alert and Oriented x  3. pleasant and cooperative. General Notes: Upon inspection patient's wound bed actually showed signs of excellent epithelization I do not think there is anything remaining open at this point. I do believe he wants to protect this whenever he is outside working but otherwise inside I do not believe this is good to be an issue. Integumentary (Hair, Skin) Wound #1 status is Healed - Epithelialized. Original cause of wound  was Trauma. The date acquired was: 10/22/2021. The wound has been in treatment 7 weeks. The wound is located on the Right,Medial Lower Leg. The wound measures 0cm length x 0cm width x 0cm depth; 0cm^2 area and 0cm^3 volume. There is Fat Layer (Subcutaneous Tissue) exposed. There is a small amount of serosanguineous drainage noted. The wound margin is flat and intact. There is large (67-100%) red, hyper - granulation within the wound bed. There is no necrotic tissue within the wound bed. Assessment Active Problems ICD-10 Struck by cow, initial encounter 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r exposed Plan Discharge From [facility] Services: Discharge from [place] Treatment Complete - wound healed. please call if any further issues arise Wear compression garments daily. Put garments on first thing when you wake up and remove them before bed. - only as needed Moisturize legs daily after removing compression garments. Bathing/ Shower/ Hygiene: Other: - cover wound and wear pants when outside Edema Control - Lymphedema / Segmental Compressive Device / Other: Tubigrip single layer applied. - wear tubi or compression socks as needed 1. I would recommend while he is inside the do leave this open area just use lotion over the area. 2. When he is outside I would recommend that he cover it with a Tubigrip and then his pants as well for protection. We will see the patient back for follow-up visit as needed. Electronic Signature(s) Signed: 12/20/2021 9:24:04 AM By: Worthy Keeler PA-C Entered By: Worthy Keeler on 12/20/2021 09:24:04 [name], [name] ([id number]) -------------------------------------------------------------------------------- SuperBill Details Patient Name: [name] Date of Service: 12/20/2021 Medical Record Number: [id number] Patient Account Number: [id number] Date of Birth/Sex: [birth year] (88 y.o. M) Treating RN: Carlene Coria Primary  Care Provider: Lorrene Reid Other Clinician: Massie Kluver Referring Provider: Lorrene Reid Treating Provider/Extender: Skipper Cliche in Treatment: 7 Diagnosis Coding ICD-10 Codes Code Description W55.10XA Struck by cow, initial encounter [phone] 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r exposed Facility Procedures CPT4 Code: [id number] Description: [phone] - WOUND CARE VISIT-LEV 2 EST PT Modifier: Quantity: 1 Physician Procedures CPT4 Code: [id number] Description: 26834 - WC PHYS LEVEL 3 - EST PT Modifier: Quantity: 1 CPT4 Code: Description: ICD-10 Diagnosis Description W55.22XA Struck by cow, initial encounter [phone] Non-pressure chronic ulcer of other part of right lower leg with fat la Modifier: yer exposed Quantity: Electronic Signature(s) Signed: 12/20/2021 9:24:14 AM By: Worthy Keeler PA-C Entered By: Worthy Keeler on 12/20/2021 09:24:13

## 2021-12-23 ENCOUNTER — Encounter: Payer: Self-pay | Admitting: Physician Assistant

## 2021-12-23 ENCOUNTER — Ambulatory Visit (INDEPENDENT_AMBULATORY_CARE_PROVIDER_SITE_OTHER): Payer: Medicare HMO | Admitting: Physician Assistant

## 2021-12-23 VITALS — BP 194/62 | HR 57 | Temp 97.7°F | Ht 68.0 in | Wt 138.0 lb

## 2021-12-23 DIAGNOSIS — M545 Low back pain, unspecified: Secondary | ICD-10-CM

## 2021-12-23 MED ORDER — METHOCARBAMOL 500 MG PO TABS
500.0000 mg | ORAL_TABLET | Freq: Three times a day (TID) | ORAL | 0 refills | Status: DC | PRN
Start: 2021-12-23 — End: 2023-09-20

## 2021-12-23 NOTE — Addendum Note (Signed)
Addended by: Lorrene Reid on: 12/23/2021 03:47 PM   Modules accepted: Orders

## 2021-12-23 NOTE — Progress Notes (Addendum)
  Established patient acute visit   Patient: [name]   DOB: 06/13/1930   88 y.o. Male  MRN: [id number] Visit Date: 12/23/2021  Chief Complaint  Patient presents with   Back Pain   Subjective    HPI  Patient presents with c/o low back which is worse by movement x 1 week. States about 1 week ago was sitting on the couch and when standing up felt pain in his lower back. Denies dysuria, hematuria, urinary frequency or urgency. States warm water provides some relief.     Medications: Outpatient Medications Prior to Visit  Medication Sig   acitretin (SORIATANE) 25 MG capsule Take 25 mg by mouth daily.   albuterol (PROAIR HFA) 108 (90 Base) MCG/ACT inhaler 2 puffs every 4 hours as needed only  if your can't catch your breath (Patient taking differently: Inhale 2 puffs into the lungs every 6 (six) hours as needed for wheezing or shortness of breath.)   amLODipine (NORVASC) 5 MG tablet TAKE 1 TABLET (5 MG TOTAL) BY MOUTH DAILY.   budesonide-formoterol (SYMBICORT) 160-4.5 MCG/ACT inhaler TAKE 2 PUFFS FIRST THING IN AM AND THEN ANOTHER 2 PUFFS ABOUT 12 HOURS LATER.   HYDROcodone-acetaminophen (NORCO) 5-325 MG tablet Take 0.5-1 tablets by mouth every 8 (eight) hours as needed for moderate pain.   latanoprost (XALATAN) 0.005 % ophthalmic solution Place 1 drop into both eyes at bedtime.   ondansetron (ZOFRAN) 4 MG tablet Take 1 tablet (4 mg total) by mouth every 6 (six) hours as needed for nausea.   valsartan-hydrochlorothiazide (DIOVAN-HCT) 320-12.5 MG tablet Take 1 tablet by mouth daily.   [DISCONTINUED] methocarbamol (ROBAXIN) 500 MG tablet Take 1 tablet (500 mg total) by mouth every 8 (eight) hours as needed for muscle spasms.   No facility-administered medications prior to visit.    Review of Systems Review of Systems:  A fourteen system review of systems was performed and found to be positive as per HPI.     Objective    BP (!) 194/62   Pulse (!) 57   Temp 97.7 F (36.5 C)    Ht '5\' 8"'$  (1.727 m)   Wt 138 lb (62.6 kg)   SpO2 96%   BMI 20.98 kg/m    Physical Exam  General:  Well Developed, well nourished, appropriate for stated age.  Neuro:  Alert and oriented,  extra-ocular muscles intact  HEENT:  Normocephalic, atraumatic, neck supple  Skin:  no gross rash, warm, pink. Abdomen: no CVA tenderness Cardiac:  RRR, S1 S2 Respiratory: CTA B/L  MSK: tenderness of lower back near SI joints, discomfort with spinal flexion and rotation, no step-off Vascular:  Ext warm, no cyanosis apprec.; cap RF less 2 sec. Psych:  No HI/SI, judgement and insight good, Euthymic mood. Full Affect.   No results found for any visits on 12/23/21.  Assessment & Plan     Patient presenting with low back pain x 1 week and likely secondary to muscle strain. Discussed conservative therapy with lidocaine patch or heat therapy. Offered muscle relaxer and recommend to let me know if decides to try medication. Follow-up as needed.  Addendum: Patient requested muscle relaxer. Rx sent for methocarbamol 500 mg TID as needed.  Return if symptoms worsen or fail to improve.        Lorrene Reid, PA-C  [facility] [phone] (phone) [phone] (fax)  [place]

## 2021-12-23 NOTE — Progress Notes (Signed)
JOREL, GRAVLIN ([id number]) Visit Report for 12/20/2021 Arrival Information Details Patient Name: KIEV, LABROSSE Date of Service: 12/20/2021 8:15 AM Medical Record Number: [id number] Patient Account Number: [id number] Date of Birth/Sex: [birth year] (88 y.o. M) Treating RN: Carlene Coria Primary Care Johara Lodwick: Lorrene Reid Other Clinician: Massie Kluver Referring Zorana Brockwell: Lorrene Reid Treating Farrie Sann/Extender: Skipper Cliche in Treatment: 7 Visit Information History Since Last Visit All ordered tests and consults were completed: No Patient Arrived: Ambulatory Added or deleted any medications: No Arrival Time: 08:33 Any new allergies or adverse reactions: No Transfer Assistance: None Had a fall or experienced change in No Patient Requires Transmission-Based Precautions: No activities of daily living that may affect Patient Has Alerts: No risk of falls: Hospitalized since last visit: No Pain Present Now: No Electronic Signature(s) Signed: 12/21/2021 12:46:44 PM By: Massie Kluver Entered By: Massie Kluver on 12/20/2021 08:33:29 Boylan, Rachelle Hora ([id number]) -------------------------------------------------------------------------------- Clinic Level of Care Assessment Details Patient Name: [name] Date of Service: 12/20/2021 8:15 AM Medical Record Number: [id number] Patient Account Number: [id number] Date of Birth/Sex: 1930-12-26 (88 y.o. M) Treating RN: Carlene Coria Primary Care Riker Collier: Lorrene Reid Other Clinician: Massie Kluver Referring Liron Eissler: Lorrene Reid Treating Kourtnie Sachs/Extender: Skipper Cliche in Treatment: 7 Clinic Level of Care Assessment Items TOOL 4 Quantity Score '[]'$  - Use when only an EandM is performed on FOLLOW-UP visit 0 ASSESSMENTS - Nursing Assessment / Reassessment X - Reassessment of Co-morbidities (includes updates in patient status) 1 10 X- 1 5 Reassessment of Adherence to Treatment Plan ASSESSMENTS - Wound and  Skin Assessment / Reassessment X - Simple Wound Assessment / Reassessment - one wound 1 5 '[]'$  - 0 Complex Wound Assessment / Reassessment - multiple wounds '[]'$  - 0 Dermatologic / Skin Assessment (not related to wound area) ASSESSMENTS - Focused Assessment '[]'$  - Circumferential Edema Measurements - multi extremities 0 '[]'$  - 0 Nutritional Assessment / Counseling / Intervention '[]'$  - 0 Lower Extremity Assessment (monofilament, tuning fork, pulses) '[]'$  - 0 Peripheral Arterial Disease Assessment (using hand held doppler) ASSESSMENTS - Ostomy and/or Continence Assessment and Care '[]'$  - Incontinence Assessment and Management 0 '[]'$  - 0 Ostomy Care Assessment and Management (repouching, etc.) PROCESS - Coordination of Care X - Simple Patient / Family Education for ongoing care 1 15 '[]'$  - 0 Complex (extensive) Patient / Family Education for ongoing care '[]'$  - 0 Staff obtains Programmer, systems, Records, Test Results / Process Orders '[]'$  - 0 Staff telephones HHA, Nursing Homes / Clarify orders / etc '[]'$  - 0 Routine Transfer to another Facility (non-emergent condition) '[]'$  - 0 Routine Hospital Admission (non-emergent condition) '[]'$  - 0 New Admissions / Biomedical engineer / Ordering NPWT, Apligraf, etc. '[]'$  - 0 Emergency Hospital Admission (emergent condition) X- 1 10 Simple Discharge Coordination '[]'$  - 0 Complex (extensive) Discharge Coordination PROCESS - Special Needs '[]'$  - Pediatric / Minor Patient Management 0 '[]'$  - 0 Isolation Patient Management '[]'$  - 0 Hearing / Language / Visual special needs '[]'$  - 0 Assessment of Community assistance (transportation, D/C planning, etc.) '[]'$  - 0 Additional assistance / Altered mentation '[]'$  - 0 Support Surface(s) Assessment (bed, cushion, seat, etc.) INTERVENTIONS - Wound Cleansing / Measurement Aloia, Emitt J. ([id number]) X- 1 5 Simple Wound Cleansing - one wound '[]'$  - 0 Complex Wound Cleansing - multiple wounds X- 1 5 Wound Imaging (photographs - any  number of wounds) '[]'$  - 0 Wound Tracing (instead of photographs) '[]'$  - 0 Simple Wound Measurement - one wound '[]'$  - 0 Complex Wound Measurement -  multiple wounds INTERVENTIONS - Wound Dressings X - Small Wound Dressing one or multiple wounds 1 10 '[]'$  - 0 Medium Wound Dressing one or multiple wounds '[]'$  - 0 Large Wound Dressing one or multiple wounds '[]'$  - 0 Application of Medications - topical '[]'$  - 0 Application of Medications - injection INTERVENTIONS - Miscellaneous '[]'$  - External ear exam 0 '[]'$  - 0 Specimen Collection (cultures, biopsies, blood, body fluids, etc.) '[]'$  - 0 Specimen(s) / Culture(s) sent or taken to Lab for analysis '[]'$  - 0 Patient Transfer (multiple staff / Civil Service fast streamer / Similar devices) '[]'$  - 0 Simple Staple / Suture removal (25 or less) '[]'$  - 0 Complex Staple / Suture removal (26 or more) '[]'$  - 0 Hypo / Hyperglycemic Management (close monitor of Blood Glucose) '[]'$  - 0 Ankle / Brachial Index (ABI) - do not check if billed separately X- 1 5 Vital Signs Has the patient been seen at the hospital within the last three years: Yes Total Score: 70 Level Of Care: New/Established - Level 2 Electronic Signature(s) Signed: 12/21/2021 12:46:44 PM By: Massie Kluver Entered By: Massie Kluver on 12/20/2021 08:47:26 Demeter, Rachelle Hora ([id number]) -------------------------------------------------------------------------------- Encounter Discharge Information Details Patient Name: [name] Date of Service: 12/20/2021 8:15 AM Medical Record Number: [id number] Patient Account Number: [id number] Date of Birth/Sex: 1930/11/03 (88 y.o. M) Treating RN: Carlene Coria Primary Care Safal Halderman: Lorrene Reid Other Clinician: Massie Kluver Referring Abdur Hoglund: Lorrene Reid Treating Eyonna Sandstrom/Extender: Skipper Cliche in Treatment: 7 Encounter Discharge Information Items Discharge Condition: Stable Ambulatory Status: Ambulatory Discharge Destination: Home Transportation:  Private Auto Accompanied By: self Schedule Follow-up Appointment: Yes Clinical Summary of Care: Electronic Signature(s) Signed: 12/21/2021 12:46:44 PM By: Massie Kluver Entered By: Massie Kluver on 12/20/2021 08:48:51 Culotta, Rachelle Hora ([id number]) -------------------------------------------------------------------------------- Lower Extremity Assessment Details Patient Name: [name] Date of Service: 12/20/2021 8:15 AM Medical Record Number: [id number] Patient Account Number: [id number] Date of Birth/Sex: 12/23/1930 (88 y.o. M) Treating RN: Carlene Coria Primary Care Arrion Burruel: Lorrene Reid Other Clinician: Massie Kluver Referring Quartez Lagos: Lorrene Reid Treating Josias Tomerlin/Extender: Skipper Cliche in Treatment: 7 Electronic Signature(s) Signed: 12/21/2021 12:46:44 PM By: Massie Kluver Signed: 12/23/2021 4:19:23 PM By: Carlene Coria RN Entered By: Massie Kluver on 12/20/2021 08:35:02 ENRRIQUE, MIERZWA ([id number]) -------------------------------------------------------------------------------- Multi Wound Chart Details Patient Name: [name] Date of Service: 12/20/2021 8:15 AM Medical Record Number: [id number] Patient Account Number: [id number] Date of Birth/Sex: Mar 27, 1931 (88 y.o. M) Treating RN: Carlene Coria Primary Care Wyndi Northrup: Lorrene Reid Other Clinician: Massie Kluver Referring Milderd Manocchio: Lorrene Reid Treating Levander Katzenstein/Extender: Skipper Cliche in Treatment: 7 Vital Signs Height(in): 70 Pulse(bpm): 9 Weight(lbs): 130 Blood Pressure(mmHg): 195/82 Body Mass Index(BMI): 19.8 Temperature(F): 97.8 Respiratory Rate(breaths/min): 16 Photos: [1:No Photos] [N/A:N/A] Wound Location: [1:Right, Medial Lower Leg] [N/A:N/A] Wounding Event: [1:Trauma] [N/A:N/A] Primary Etiology: [1:Trauma, Other] [N/A:N/A] Comorbid History: [1:Cataracts, Glaucoma, Asthma, Hypertension, Received Chemotherapy, Received Radiation] [N/A:N/A] Date Acquired:  [1:10/22/2021] [N/A:N/A] Weeks of Treatment: [1:7] [N/A:N/A] Wound Status: [1:Healed - Epithelialized] [N/A:N/A] Wound Recurrence: [1:No] [N/A:N/A] Clustered Wound: [1:Yes] [N/A:N/A] Clustered Quantity: [1:3] [N/A:N/A] Measurements L x W x D (cm) [1:0x0x0] [N/A:N/A] Area (cm) : [1:0] [N/A:N/A] Volume (cm) : [1:0] [N/A:N/A] % Reduction in Area: [1:100.00%] [N/A:N/A] % Reduction in Volume: [1:100.00%] [N/A:N/A] Classification: [1:Full Thickness Without Exposed Support Structures] [N/A:N/A] Exudate Amount: [1:Small] [N/A:N/A] Exudate Type: [1:Serosanguineous] [N/A:N/A] Exudate Color: [1:red, brown] [N/A:N/A] Wound Margin: [1:Flat and Intact] [N/A:N/A] Granulation Amount: [1:Large (67-100%)] [N/A:N/A] Granulation Quality: [1:Red, Hyper-granulation] [N/A:N/A] Necrotic Amount: [1:None Present (0%)] [N/A:N/A] Exposed Structures: [1:Fat Layer (Subcutaneous Tissue):  Yes Fascia: No Tendon: No Muscle: No Joint: No Bone: No Large (67-100%)] [N/A:N/A N/A] Treatment Notes Electronic Signature(s) Signed: 12/21/2021 12:46:44 PM By: Massie Kluver Entered By: Massie Kluver on 12/20/2021 08:43:32 Gertz, Rachelle Hora ([id number]) -------------------------------------------------------------------------------- Multi-Disciplinary Care Plan Details Patient Name: [name] Date of Service: 12/20/2021 8:15 AM Medical Record Number: [id number] Patient Account Number: [id number] Date of Birth/Sex: Jan 29, 1931 (88 y.o. M) Treating RN: Carlene Coria Primary Care Addylynn Balin: Lorrene Reid Other Clinician: Massie Kluver Referring Nashiya Disbrow: Lorrene Reid Treating Isauro Skelley/Extender: Skipper Cliche in Treatment: 7 Active Inactive Electronic Signature(s) Signed: 12/21/2021 12:46:44 PM By: Massie Kluver Signed: 12/23/2021 4:19:23 PM By: Carlene Coria RN Entered By: Massie Kluver on 12/20/2021 08:43:25 Boyadjian, Rachelle Hora  ([id number]) -------------------------------------------------------------------------------- Pain Assessment Details Patient Name: [name] Date of Service: 12/20/2021 8:15 AM Medical Record Number: [id number] Patient Account Number: [id number] Date of Birth/Sex: July 23, 1930 (88 y.o. M) Treating RN: Carlene Coria Primary Care Murlin Schrieber: Lorrene Reid Other Clinician: Massie Kluver Referring Yarelli Decelles: Lorrene Reid Treating Emiline Mancebo/Extender: Skipper Cliche in Treatment: 7 Active Problems Location of Pain Severity and Description of Pain Patient Has Paino No Site Locations Pain Management and Medication Current Pain Management: Electronic Signature(s) Signed: 12/21/2021 12:46:44 PM By: Massie Kluver Signed: 12/23/2021 4:19:23 PM By: Carlene Coria RN Entered By: Massie Kluver on 12/20/2021 08:34:37 [name] ([id number]) -------------------------------------------------------------------------------- Patient/Caregiver Education Details Patient Name: [name] Date of Service: 12/20/2021 8:15 AM Medical Record Number: [id number] Patient Account Number: [id number] Date of Birth/Gender: 1930-10-27 (88 y.o. M) Treating RN: Carlene Coria Primary Care Physician: Lorrene Reid Other Clinician: Massie Kluver Referring Physician: Lorrene Reid Treating Physician/Extender: Skipper Cliche in Treatment: 7 Education Assessment Education Provided To: Patient Education Topics Provided Wound/Skin Impairment: Handouts: Other: wound healed. Please call if any further issues arise Methods: Explain/Verbal Responses: State content correctly Electronic Signature(s) Signed: 12/21/2021 12:46:44 PM By: Massie Kluver Entered By: Massie Kluver on 12/20/2021 08:47:52 Cicalese, Rachelle Hora ([id number]) -------------------------------------------------------------------------------- Wound Assessment Details Patient Name: [name] Date of Service: 12/20/2021 8:15  AM Medical Record Number: [id number] Patient Account Number: [id number] Date of Birth/Sex: Sep 01, 1930 (88 y.o. M) Treating RN: Carlene Coria Primary Care Damaso Laday: Lorrene Reid Other Clinician: Massie Kluver Referring Krystalynn Ridgeway: Lorrene Reid Treating Brason Berthelot/Extender: Skipper Cliche in Treatment: 7 Wound Status Wound Number: 1 Primary Trauma, Other Etiology: Wound Location: Right, Medial Lower Leg Wound Healed - Epithelialized Wounding Event: Trauma Status: Date Acquired: 10/22/2021 Comorbid Cataracts, Glaucoma, Asthma, Hypertension, Received Weeks Of Treatment: 7 History: Chemotherapy, Received Radiation Clustered Wound: Yes Wound Measurements Length: (cm) Width: (cm) Depth: (cm) Clustered Quantity: Area: (cm) Volume: (cm) 0 % Reduction in Area: 100% 0 % Reduction in Volume: 100% 0 Epithelialization: Large (67-100%) 3 0 0 Wound Description Classification: Full Thickness Without Exposed Support Structu Wound Margin: Flat and Intact Exudate Amount: Small Exudate Type: Serosanguineous Exudate Color: red, brown res Foul Odor After Cleansing: No Slough/Fibrino No Wound Bed Granulation Amount: Large (67-100%) Exposed Structure Granulation Quality: Red, Hyper-granulation Fascia Exposed: No Necrotic Amount: None Present (0%) Fat Layer (Subcutaneous Tissue) Exposed: Yes Tendon Exposed: No Muscle Exposed: No Joint Exposed: No Bone Exposed: No Treatment Notes Wound #1 (Lower Leg) Wound Laterality: Right, Medial Cleanser Peri-Wound Care Topical Primary Dressing Secondary Dressing Secured With Compression Wrap Compression Stockings Add-Ons Electronic Signature(s) Signed: 12/21/2021 12:46:44 PM By: Massie Kluver Signed: 12/23/2021 4:19:23 PM By: Carlene Coria RN RUFORD, DUDZINSKI ([id number]) Entered By: Massie Kluver on 12/20/2021 08:42:49 Gashi, Rachelle Hora  ([id number]) -------------------------------------------------------------------------------- Vitals Details Patient Name: Legrand Como  J. Date of Service: 12/20/2021 8:15 AM Medical Record Number: [id number] Patient Account Number: [id number] Date of Birth/Sex: 02/05/1931 (88 y.o. M) Treating RN: Carlene Coria Primary Care Coston Mandato: Lorrene Reid Other Clinician: Massie Kluver Referring Takako Minckler: Lorrene Reid Treating Sierrah Luevano/Extender: Skipper Cliche in Treatment: 7 Vital Signs Time Taken: 08:20 Temperature (F): 97.8 Height (in): 68 Pulse (bpm): 55 Weight (lbs): 130 Respiratory Rate (breaths/min): 16 Body Mass Index (BMI): 19.8 Blood Pressure (mmHg): 195/82 Reference Range: 80 - 120 mg / dl Notes Patient states blood pressure always high in the doctors office Electronic Signature(s) Signed: 12/21/2021 12:46:44 PM By: Massie Kluver Entered By: Massie Kluver on 12/20/2021 08:34:26

## 2022-01-06 ENCOUNTER — Encounter: Payer: Self-pay | Admitting: Nurse Practitioner

## 2022-01-06 ENCOUNTER — Ambulatory Visit (INDEPENDENT_AMBULATORY_CARE_PROVIDER_SITE_OTHER): Payer: Medicare HMO | Admitting: Nurse Practitioner

## 2022-01-06 VITALS — BP 148/80 | HR 62 | Ht 68.0 in | Wt 133.0 lb

## 2022-01-06 DIAGNOSIS — L989 Disorder of the skin and subcutaneous tissue, unspecified: Secondary | ICD-10-CM

## 2022-01-06 DIAGNOSIS — I10 Essential (primary) hypertension: Secondary | ICD-10-CM | POA: Diagnosis not present

## 2022-01-06 DIAGNOSIS — L0390 Cellulitis, unspecified: Secondary | ICD-10-CM | POA: Diagnosis not present

## 2022-01-06 DIAGNOSIS — L409 Psoriasis, unspecified: Secondary | ICD-10-CM | POA: Diagnosis not present

## 2022-01-06 NOTE — Progress Notes (Signed)
Established patient visit   Patient: [name]   DOB: 1930/06/09   88 y.o. Male  MRN: [id number] Visit Date: 01/06/2022  Chief Complaint  Patient presents with   Wound Check   Subjective    Wound Check    Acute visit -has area on left buttock which he has scratched and feels a little tender -does have moderate to severe psoriasis and he is concerned that he may have scratched an area and got it infected . -he has recently been hospitalized for severe wound infection on right lower leg. States that he wants to address any issues with his skin before they get that bad again .  Medications: Outpatient Medications Prior to Visit  Medication Sig   acitretin (SORIATANE) 25 MG capsule Take 25 mg by mouth daily.   albuterol (PROAIR HFA) 108 (90 Base) MCG/ACT inhaler 2 puffs every 4 hours as needed only  if your can't catch your breath (Patient taking differently: Inhale 2 puffs into the lungs every 6 (six) hours as needed for wheezing or shortness of breath.)   amLODipine (NORVASC) 5 MG tablet TAKE 1 TABLET (5 MG TOTAL) BY MOUTH DAILY.   budesonide-formoterol (SYMBICORT) 160-4.5 MCG/ACT inhaler TAKE 2 PUFFS FIRST THING IN AM AND THEN ANOTHER 2 PUFFS ABOUT 12 HOURS LATER.   HYDROcodone-acetaminophen (NORCO) 5-325 MG tablet Take 0.5-1 tablets by mouth every 8 (eight) hours as needed for moderate pain.   latanoprost (XALATAN) 0.005 % ophthalmic solution Place 1 drop into both eyes at bedtime.   methocarbamol (ROBAXIN) 500 MG tablet Take 1 tablet (500 mg total) by mouth every 8 (eight) hours as needed for muscle spasms.   ondansetron (ZOFRAN) 4 MG tablet Take 1 tablet (4 mg total) by mouth every 6 (six) hours as needed for nausea.   valsartan-hydrochlorothiazide (DIOVAN-HCT) 320-12.5 MG tablet Take 1 tablet by mouth daily.   No facility-administered medications prior to visit.    Review of Systems  Constitutional:  Negative for activity change, chills, fatigue and fever.  HENT:   Negative for congestion, postnasal drip, rhinorrhea, sinus pressure, sinus pain, sneezing and sore throat.   Eyes: Negative.   Respiratory:  Negative for cough, shortness of breath and wheezing.   Cardiovascular:  Negative for chest pain and palpitations.       Elevated blood pressure.   Gastrointestinal:  Negative for constipation, diarrhea, nausea and vomiting.  Endocrine: Negative for cold intolerance, heat intolerance, polydipsia and polyuria.  Genitourinary:  Negative for dysuria, frequency and urgency.  Musculoskeletal:  Negative for back pain and myalgias.  Skin:  Negative for rash.       Inflamed area of skin on his left buttock. Concern for infection   Allergic/Immunologic: Negative for environmental allergies.  Neurological:  Negative for dizziness, weakness and headaches.  Psychiatric/Behavioral:  The patient is not nervous/anxious.      Objective     Today's Vitals   01/06/22 0942  BP: (Abnormal) 148/80  Pulse: 62  SpO2: 97%  Weight: 133 lb (60.3 kg)  Height: '5\' 8"'$  (1.727 m)   Body mass index is 20.22 kg/m.   Physical Exam Vitals and nursing note reviewed.  Constitutional:      Appearance: Normal appearance. He is well-developed.  HENT:     Head: Normocephalic.  Eyes:     Pupils: Pupils are equal, round, and reactive to light.  Cardiovascular:     Rate and Rhythm: Normal rate and regular rhythm.     Pulses: Normal pulses.  Heart sounds: Normal heart sounds.  Pulmonary:     Effort: Pulmonary effort is normal.     Breath sounds: Normal breath sounds.  Abdominal:     Palpations: Abdomen is soft.  Musculoskeletal:        General: Normal range of motion.     Cervical back: Normal range of motion and neck supple.  Lymphadenopathy:     Cervical: No cervical adenopathy.  Skin:    General: Skin is warm and dry.     Capillary Refill: Capillary refill takes less than 2 seconds.     Comments: Plaque psoriais preset on many areas of the skin. There is area on  the lower left butick which is bigger than the others. It is scabbed over. There is not drainage or warmth. No evidece of abscess or infection at this time.   Neurological:     General: No focal deficit present.     Mental Status: He is alert and oriented to person, place, and time.  Psychiatric:        Mood and Affect: Mood normal.        Behavior: Behavior normal.        Thought Content: Thought content normal.        Judgment: Judgment normal.       Assessment & Plan    1. Generalized psoriasis atient will continue to use prescribed ointment for areas of moderate psoriasis   2. Cellulitis of skin Appears to be flare of psoriasis rather than infection. Advised patient to monitor. If worsens, becomes tender, becomes warm, or starts to drain, he should return for further evaluation.   3. Essential hypertension Generally stable. Continue bp medication as prescribed     Return for prn worsening or persistent symptoms.        Ronnell Freshwater, NP  [facility] [phone] (phone) [phone] (fax)  [place]

## 2022-01-23 DIAGNOSIS — L989 Disorder of the skin and subcutaneous tissue, unspecified: Secondary | ICD-10-CM | POA: Insufficient documentation

## 2022-01-23 DIAGNOSIS — L409 Psoriasis, unspecified: Secondary | ICD-10-CM | POA: Insufficient documentation

## 2022-02-02 DIAGNOSIS — Z791 Long term (current) use of non-steroidal anti-inflammatories (NSAID): Secondary | ICD-10-CM | POA: Diagnosis not present

## 2022-02-02 DIAGNOSIS — I25119 Atherosclerotic heart disease of native coronary artery with unspecified angina pectoris: Secondary | ICD-10-CM | POA: Diagnosis not present

## 2022-02-02 DIAGNOSIS — L409 Psoriasis, unspecified: Secondary | ICD-10-CM | POA: Diagnosis not present

## 2022-02-02 DIAGNOSIS — Z823 Family history of stroke: Secondary | ICD-10-CM | POA: Diagnosis not present

## 2022-02-02 DIAGNOSIS — N529 Male erectile dysfunction, unspecified: Secondary | ICD-10-CM | POA: Diagnosis not present

## 2022-02-02 DIAGNOSIS — I700 Atherosclerosis of aorta: Secondary | ICD-10-CM | POA: Diagnosis not present

## 2022-02-02 DIAGNOSIS — J45909 Unspecified asthma, uncomplicated: Secondary | ICD-10-CM | POA: Diagnosis not present

## 2022-02-02 DIAGNOSIS — I10 Essential (primary) hypertension: Secondary | ICD-10-CM | POA: Diagnosis not present

## 2022-02-02 DIAGNOSIS — M1990 Unspecified osteoarthritis, unspecified site: Secondary | ICD-10-CM | POA: Diagnosis not present

## 2022-02-02 DIAGNOSIS — H409 Unspecified glaucoma: Secondary | ICD-10-CM | POA: Diagnosis not present

## 2022-02-02 DIAGNOSIS — Z7951 Long term (current) use of inhaled steroids: Secondary | ICD-10-CM | POA: Diagnosis not present

## 2022-02-02 DIAGNOSIS — E785 Hyperlipidemia, unspecified: Secondary | ICD-10-CM | POA: Diagnosis not present

## 2022-02-05 ENCOUNTER — Other Ambulatory Visit: Payer: Self-pay | Admitting: Physician Assistant

## 2022-02-05 DIAGNOSIS — I10 Essential (primary) hypertension: Secondary | ICD-10-CM

## 2022-02-23 ENCOUNTER — Encounter: Payer: Self-pay | Admitting: Physician Assistant

## 2022-02-23 ENCOUNTER — Ambulatory Visit (INDEPENDENT_AMBULATORY_CARE_PROVIDER_SITE_OTHER): Payer: Medicare HMO | Admitting: Physician Assistant

## 2022-02-23 VITALS — BP 140/82 | HR 66 | Temp 97.5°F | Ht 68.0 in | Wt 140.0 lb

## 2022-02-23 DIAGNOSIS — Z23 Encounter for immunization: Secondary | ICD-10-CM | POA: Diagnosis not present

## 2022-02-23 DIAGNOSIS — I10 Essential (primary) hypertension: Secondary | ICD-10-CM

## 2022-02-23 DIAGNOSIS — E782 Mixed hyperlipidemia: Secondary | ICD-10-CM | POA: Diagnosis not present

## 2022-02-23 DIAGNOSIS — Z0000 Encounter for general adult medical examination without abnormal findings: Secondary | ICD-10-CM | POA: Diagnosis not present

## 2022-02-23 NOTE — Progress Notes (Signed)
Subjective:   [name] is a 88 y.o. male who presents for Medicare Annual/Subsequent preventive examination.  Review of Systems    General:   No F/C, wt loss Pulm:   No DIB, SOB, pleuritic chest pain Card:  No CP, palpitations Abd:  No n/v/d or pain Ext:  No inc edema from baseline    Objective:    Today's Vitals   02/23/22 0847 02/23/22 0912  BP: (!) 186/88 (!) 140/82  Pulse: 66   Temp: (!) 97.5 F (36.4 C)   TempSrc: Temporal   Weight: 140 lb (63.5 kg)   Height: '5\' 8"'$  (1.727 m)    Body mass index is 21.29 kg/m.     10/28/2021    6:44 PM 10/28/2021   10:45 AM 05/05/2020    7:58 AM 05/06/2019    8:09 AM 05/04/2018    8:03 AM 12/19/2017   11:12 AM 05/04/2017    7:59 AM  Advanced Directives  Does Patient Have a Medical Advance Directive? Yes Yes Yes Yes Yes Yes Yes  Type of Advance Directive Out of facility DNR (pink MOST or yellow form) [place];Living will [place];Living will [place];Living will Living will;Healthcare Power of [place];Living will [place];Living will  Does patient want to make changes to medical advance directive? No - Patient declined  No - Patient declined No - Patient declined  No - Patient declined   Copy of [place] in Chart?   No - copy requested    No - copy requested  Pre-existing out of facility DNR order (yellow form or pink MOST form) Physician notified to receive inpatient order          Current Medications (verified) Outpatient Encounter Medications as of 02/23/2022  Medication Sig   acitretin (SORIATANE) 25 MG capsule Take 25 mg by mouth daily.   albuterol (PROAIR HFA) 108 (90 Base) MCG/ACT inhaler 2 puffs every 4 hours as needed only  if your can't catch your breath (Patient taking differently: Inhale 2 puffs into the lungs every 6 (six) hours as needed for wheezing or shortness of breath.)   amLODipine  (NORVASC) 5 MG tablet TAKE 1 TABLET (5 MG TOTAL) BY MOUTH DAILY.   budesonide-formoterol (SYMBICORT) 160-4.5 MCG/ACT inhaler TAKE 2 PUFFS FIRST THING IN AM AND THEN ANOTHER 2 PUFFS ABOUT 12 HOURS LATER.   HYDROcodone-acetaminophen (NORCO) 5-325 MG tablet Take 0.5-1 tablets by mouth every 8 (eight) hours as needed for moderate pain.   latanoprost (XALATAN) 0.005 % ophthalmic solution Place 1 drop into both eyes at bedtime.   methocarbamol (ROBAXIN) 500 MG tablet Take 1 tablet (500 mg total) by mouth every 8 (eight) hours as needed for muscle spasms.   ondansetron (ZOFRAN) 4 MG tablet Take 1 tablet (4 mg total) by mouth every 6 (six) hours as needed for nausea.   valsartan-hydrochlorothiazide (DIOVAN-HCT) 320-12.5 MG tablet Take 1 tablet by mouth daily.   No facility-administered encounter medications on file as of 02/23/2022.    Allergies (verified) Patient has no known allergies.   History: Past Medical History:  Diagnosis Date   Asthma    Depression    wife died 2 months ago. 2011/04/22   Eyelid cancer    Hyperlipidemia, mixed 05/06/2015   Hypertension    Rectal cancer ([place]) 1999   Past Surgical History:  Procedure Laterality Date   COLON RESECTION  1999   cyst eyelid  2012   HAND TUMOR EXCISION  2010   right   Family History  Problem Relation Age of Onset   Hypertension Father    Stroke Father    Suicidality Brother    Colon cancer Neg Hx    Rectal cancer Neg Hx    Social History   Socioeconomic History   Marital status: Married    Spouse name: Not on file   Number of children: Not on file   Years of education: Not on file   Highest education level: Not on file  Occupational History   Not on file  Tobacco Use   Smoking status: Never   Smokeless tobacco: Never   Tobacco comments:    Never Used Tobacco  Vaping Use   Vaping Use: Never used  Substance and Sexual Activity   Alcohol use: Yes    Alcohol/week: 16.0 standard drinks of alcohol    Types: 16 Cans of  beer per week   Drug use: No   Sexual activity: Not Currently  Other Topics Concern   Not on file  Social History Narrative   Not on file   Social Determinants of Health   Financial Resource Strain: Not on file  Food Insecurity: Not on file  Transportation Needs: Not on file  Physical Activity: Not on file  Stress: Not on file  Social Connections: Not on file    Tobacco Counseling Counseling given: Not Answered Tobacco comments: Never Used Tobacco    Diabetic?no    Activities of Daily Living    12/23/2021    2:55 PM 10/28/2021    6:44 PM  In your present state of health, do you have any difficulty performing the following activities:  Hearing? 0   Vision? 0   Difficulty concentrating or making decisions? 0   Walking or climbing stairs? 0   Dressing or bathing? 0   Doing errands, shopping? 0 0    Patient Care Team: Lorrene Reid, PA-C as PCP - General (Physician Assistant) Leonard Downing, MD as Referring Physician (Family Medicine) Tanda Rockers, MD as Consulting Physician (Pulmonary Disease) Griselda Miner, MD as Consulting Physician (Dermatology) Ramonita Lab, Jovita Kussmaul, MD as Referring Physician (Ophthalmology) Danella Sensing, MD as Consulting Physician (Dermatology)  Indicate any recent Medical Services you may have received from other than [facility] providers in the past year (date may be approximate).     Assessment:   This is a routine wellness examination for [place].  Hearing/Vision screen No results found.  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 Limits salt intake and tries to have a balanced diet.   Goals Addressed   None   Depression Screen    02/23/2022    9:08 AM 12/23/2021    2:55 PM 10/27/2021    9:08 AM 08/24/2021    8:31 AM 02/03/2021    8:38 AM 09/29/2020    8:31 AM 07/16/2020    8:29 AM  PHQ 2/9 Scores  PHQ - 2  Score 3 0 0 0 0 0 0  PHQ- 9 Score 4 0 0 0 0 0 0    Fall Risk    02/23/2022    8:59 AM 12/23/2021    2:55 PM 10/27/2021    9:08 AM 08/24/2021    8:31 AM 02/03/2021    8:38 AM  Fall Risk   Falls in the past year? 0 0 0 0 0  Number falls in past yr: 0 0 0 0 0  Injury with Fall? 0 0 0 0 0  Risk for fall due to : No Fall Risks History of fall(s) No Fall Risks No Fall Risks   Follow up Falls evaluation completed Falls evaluation completed Falls evaluation completed Falls evaluation completed Falls evaluation completed    [place]:  Any stairs in or around the home? No  If so, are there any without handrails? No  Home free of loose throw rugs in walkways, pet beds, electrical cords, etc? No  Adequate lighting in your home to reduce risk of falls? Yes   ASSISTIVE DEVICES UTILIZED TO PREVENT FALLS:  Life alert? No  Use of a cane, walker or w/c? No  Grab bars in the bathroom? No  Shower chair or bench in shower? No  Elevated toilet seat or a handicapped toilet? No   TIMED UP AND GO:  Was the test performed? Yes .  Length of time to ambulate 10 feet: 10 sec.   Gait slow and steady without use of assistive device  Cognitive Function: wnl's        02/23/2022    8:54 AM 02/03/2021    8:39 AM  6CIT Screen  What Year? 0 points 0 points  What month? 0 points 0 points  What time? 0 points 0 points  Count back from 20 0 points 0 points  Months in reverse 0 points 0 points  Repeat phrase 0 points 0 points  Total Score 0 points 0 points    Immunizations Immunization History  Administered Date(s) Administered   Fluad Quad(high Dose 65+) 01/21/2019, 01/29/2020, 02/03/2021   Hepatitis B, adult 01/07/2018   Influenza Split 12/31/2016   Influenza-Unspecified 02/05/2014, 12/31/2017, 01/21/2019, 01/21/2020   Moderna Sars-Covid-2 Vaccination 05/13/2019, 06/10/2019, 02/21/2020   Pneumococcal Conjugate-13 11/05/2013   Tdap 10/28/2021   Zoster  Recombinat (Shingrix) 09/03/2017, 11/16/2017   Zoster, Live 08/30/2017    TDAP status: Up to date  Flu Vaccine status: Completed at today's visit  Pneumococcal vaccine status: Due, Education has been provided regarding the importance of this vaccine. Advised may receive this vaccine at local pharmacy or Health Dept. Aware to provide a copy of the vaccination record if obtained from local pharmacy or Health Dept. Verbalized acceptance and understanding.  Covid-19 vaccine status: Completed vaccines  Qualifies for Shingles Vaccine? Yes   Zostavax completed Yes   Shingrix Completed?: Yes  Screening Tests Health Maintenance  Topic Date Due   Pneumonia Vaccine 45+ Years old (2 - PPSV23 or PCV20) 11/06/2014   COVID-19 Vaccine (4 - Moderna risk series) 04/17/2020   INFLUENZA VACCINE  11/30/2021   Medicare Annual Wellness (AWV)  03/05/2022   TETANUS/TDAP  10/29/2031   Zoster Vaccines- Shingrix  Completed   HPV VACCINES  Aged Out    Health Maintenance  Health Maintenance Due  Topic Date Due   Pneumonia Vaccine 66+ Years old (2 - PPSV23 or PCV20) 11/06/2014   COVID-19 Vaccine (4 - Moderna risk series) 04/17/2020   INFLUENZA VACCINE  11/30/2021   Medicare Annual Wellness (AWV)  03/05/2022    Colorectal cancer screening: No longer required.   Lung Cancer Screening: (Low Dose CT Chest recommended if Age 60-80 years, 30 pack-year currently smoking OR have quit w/in 15years.) does not qualify.   Lung Cancer Screening Referral: n/a  Additional Screening:  Hepatitis C Screening: does qualify; Completed deferred  Vision Screening: Recommended annual ophthalmology exams for early detection of  glaucoma and other disorders of the eye. Is the patient up to date with their annual eye exam?  Yes  Who is the provider or what is the name of the office in which the patient attends annual eye exams? [facility] If pt is not established with a provider, would they like to be  referred to a provider to establish care? No .   Dental Screening: Recommended annual dental exams for proper oral hygiene  Community Resource Referral / Chronic Care Management: CRR required this visit?  No   CCM required this visit?  No      Plan:  -Patient wants to get labs done at his oncology visit, advised patient will place future lab orders and if unable to obtain then suggest to schedule a lab visit for fasting labs. -BP elevated on intake and repeated with improvement. Recommend to continue current medication regimen. -Pt agreeable to flu vaccine. -Recommend to follow-up in 6 months for reg OV- HTN, HLD  I have personally reviewed and noted the following in the patient's chart:   Medical and social history Use of alcohol, tobacco or illicit drugs  Current medications and supplements including opioid prescriptions. Patient is not currently taking opioid prescriptions. Functional ability and status Nutritional status Physical activity Advanced directives List of other physicians Hospitalizations, surgeries, and ER visits in previous 12 months Vitals Screenings to include cognitive, depression, and falls Referrals and appointments  In addition, I have reviewed and discussed with patient certain preventive protocols, quality metrics, and best practice recommendations. A written personalized care plan for preventive services as well as general preventive health recommendations were provided to patient.

## 2022-02-23 NOTE — Patient Instructions (Signed)
Preventive Care 65 Years and Older, Male Preventive care refers to lifestyle choices and visits with your health care provider that can promote health and wellness. Preventive care visits are also called wellness exams. What can I expect for my preventive care visit? Counseling During your preventive care visit, your health care provider may ask about your: Medical history, including: Past medical problems. Family medical history. History of falls. Current health, including: Emotional well-being. Home life and relationship well-being. Sexual activity. Memory and ability to understand (cognition). Lifestyle, including: Alcohol, nicotine or tobacco, and drug use. Access to firearms. Diet, exercise, and sleep habits. Work and work environment. Sunscreen use. Safety issues such as seatbelt and bike helmet use. Physical exam Your health care provider will check your: Height and weight. These may be used to calculate your BMI (body mass index). BMI is a measurement that tells if you are at a healthy weight. Waist circumference. This measures the distance around your waistline. This measurement also tells if you are at a healthy weight and may help predict your risk of certain diseases, such as type 2 diabetes and high blood pressure. Heart rate and blood pressure. Body temperature. Skin for abnormal spots. What immunizations do I need?  Vaccines are usually given at various ages, according to a schedule. Your health care provider will recommend vaccines for you based on your age, medical history, and lifestyle or other factors, such as travel or where you work. What tests do I need? Screening Your health care provider may recommend screening tests for certain conditions. This may include: Lipid and cholesterol levels. Diabetes screening. This is done by checking your blood sugar (glucose) after you have not eaten for a while (fasting). Hepatitis C test. Hepatitis B test. HIV (human  immunodeficiency virus) test. STI (sexually transmitted infection) testing, if you are at risk. Lung cancer screening. Colorectal cancer screening. Prostate cancer screening. Abdominal aortic aneurysm (AAA) screening. You may need this if you are a current or former smoker. Talk with your health care provider about your test results, treatment options, and if necessary, the need for more tests. Follow these instructions at home: Eating and drinking  Eat a diet that includes fresh fruits and vegetables, whole grains, lean protein, and low-fat dairy products. Limit your intake of foods with high amounts of sugar, saturated fats, and salt. Take vitamin and mineral supplements as recommended by your health care provider. Do not drink alcohol if your health care provider tells you not to drink. If you drink alcohol: Limit how much you have to 0-2 drinks a day. Know how much alcohol is in your drink. In the [place] one drink equals one 12 oz bottle of beer (355 mL), one 5 oz glass of wine (148 mL), or one 1 oz glass of hard liquor (44 mL). Lifestyle Brush your teeth every morning and night with fluoride toothpaste. Floss one time each day. Exercise for at least 30 minutes 5 or more days each week. Do not use any products that contain nicotine or tobacco. These products include cigarettes, chewing tobacco, and vaping devices, such as e-cigarettes. If you need help quitting, ask your health care provider. Do not use drugs. If you are sexually active, practice safe sex. Use a condom or other form of protection to prevent STIs. Take aspirin only as told by your health care provider. Make sure that you understand how much to take and what form to take. Work with your health care provider to find out whether it is safe   and beneficial for you to take aspirin daily. Ask your health care provider if you need to take a cholesterol-lowering medicine (statin). Find healthy ways to manage stress, such  as: Meditation, yoga, or listening to music. Journaling. Talking to a trusted person. Spending time with friends and family. Safety Always wear your seat belt while driving or riding in a vehicle. Do not drive: If you have been drinking alcohol. Do not ride with someone who has been drinking. When you are tired or distracted. While texting. If you have been using any mind-altering substances or drugs. Wear a helmet and other protective equipment during sports activities. If you have firearms in your house, make sure you follow all gun safety procedures. Minimize exposure to UV radiation to reduce your risk of skin cancer. What's next? Visit your health care provider once a year for an annual wellness visit. Ask your health care provider how often you should have your eyes and teeth checked. Stay up to date on all vaccines. This information is not intended to replace advice given to you by your health care provider. Make sure you discuss any questions you have with your health care provider. Document Revised: 10/14/2020 Document Reviewed: 10/14/2020 Elsevier Patient Education  2023 Elsevier Inc.  

## 2022-02-27 ENCOUNTER — Other Ambulatory Visit: Payer: Self-pay | Admitting: Physician Assistant

## 2022-02-27 DIAGNOSIS — I10 Essential (primary) hypertension: Secondary | ICD-10-CM

## 2022-03-08 DIAGNOSIS — Z79899 Other long term (current) drug therapy: Secondary | ICD-10-CM | POA: Diagnosis not present

## 2022-03-08 DIAGNOSIS — L400 Psoriasis vulgaris: Secondary | ICD-10-CM | POA: Diagnosis not present

## 2022-03-08 DIAGNOSIS — Z85828 Personal history of other malignant neoplasm of skin: Secondary | ICD-10-CM | POA: Diagnosis not present

## 2022-03-08 DIAGNOSIS — L309 Dermatitis, unspecified: Secondary | ICD-10-CM | POA: Diagnosis not present

## 2022-03-16 ENCOUNTER — Ambulatory Visit: Payer: Medicare HMO | Admitting: Internal Medicine

## 2022-03-16 ENCOUNTER — Encounter: Payer: Self-pay | Admitting: Internal Medicine

## 2022-03-16 VITALS — BP 161/70 | HR 49 | Temp 97.6°F | Ht 68.0 in | Wt 139.6 lb

## 2022-03-16 DIAGNOSIS — J4540 Moderate persistent asthma, uncomplicated: Secondary | ICD-10-CM

## 2022-03-16 LAB — SPIROMETRY WITH GRAPH

## 2022-03-16 NOTE — Assessment & Plan Note (Signed)
Never smoker PFT's  10/06/09   FEV1 2.56 (100 % ) ratio 51  p no % improvement from saba p ? prior to study with DLCO  116%  - PFT's  10/30/2017  FEV1 1.80 (39 % ) ratio 55   p 39 % improvement from saba p nothing prior to study with DLCO  72 % corrects to 89 % for alv volume   - 10/30/2017  symbicort 160   2 bid - 02/01/2018  After extensive coaching inhaler device,  effectiveness =    75% from baseline 25% (very late insp p trigger, short Ti) - Spirometry 02/01/2018  FEV1 1.3 (59%)  Ratio 56 p am symb 160 with poor hfa   - Allergy profile 02/01/2018 >  Eos 0.3/  IgE  35 RAST neg  - Alpha one AT rec 05/07/2018  > declined  - 02/28/2020  After extensive coaching inhaler device,  effectiveness =    50% (delayed trigger, good Ti)  > rx spacer  - 03/16/2021  After extensive coaching inhaler device,  effectiveness =  80% (short ti)   - Spirometry 03/16/2022  FEV1 1.38 (59%)  Ratio 0.67 with min curvature p am symb 80   No change FEV1 from priors suggesting a fixed obst c/w chronic asthma/ remodeling or ACOS  with option to add LAMA if decline in best day ex tol > reviewed   F/u can be q 6 m, sooner prn          Each maintenance medication was reviewed in detail including emphasizing most importantly the difference between maintenance and prns and under what circumstances the prns are to be triggered using an action plan format where appropriate.  Total time for H and P, chart review, counseling, reviewing hfa  device(s) and generating customized AVS unique to this office visit / same day charting = 27 min

## 2022-03-16 NOTE — Progress Notes (Signed)
Subjective:     Patient ID: [name], male   DOB: January 11, 1931,    MRN: [id number]    Brief patient profile:  78 yowm never smoker with hx suggestive of longstanding asthma and  h/o cough attributed to to gerd and cc  variable sob/wheeze  for which inhalers seem  to help referred to pulmonary clinic 09/14/2017 by Dr Arelia Sneddon with worse noct wheeze ? since starting ACEi and refractory to symb  160 dosed at 2bid though baseline hfa technique suboptimal.     History of Present Illness  09/14/2017 1st [facility] office visit/ [name]   Chief Complaint  Patient presents with   Consult    SOB with excertion. wheezing at night.   doe x decades worse with fence post splitting and rapid walking assoc with noct wheeze while on ACEi and better with  use of Symbicort esp at bedtime but also used with "wheezing"  Am of ov (w/in 12 hours of last symb 160  dose)  rec Symbicort 160 can be used up to 2 puffs every 12 hours Work on inhaler technique:   Stop lisinopril -hctz and start diovan 160-25 mg daily - call if problems  Please schedule a follow up office visit in 6 weeks, call sooner if needed with all medications /inhalers/ solutions in hand so we can verify exactly what you are taking. This includes all medications from all doctors and over the counters  - PFTs on return       03/16/2021  f/u ov/Fowler Antos re: mod asthma  maint on symbicort 160 2bid   Chief Complaint  Patient presents with   Follow-up    Sob with exertion, at baseline.   Dyspnea:  main problem is digging / does fine walking  Cough: none  Sleeping: 30 degrees  SABA use: none  02: none  Covid status:   vax x 4  Rec Plan A = Automatic = Always=   Symbicort 160 Take 2 puffs first thing in am and then another 2 puffs about 12 hours later  Work on inhaler technique   Plan B = Backup (to supplement plan A, not to replace it) Only use your albuterol inhaler as a rescue medication  Ok to try albuterol 15 min before an  activity (on alternating days)  that you know would usually make you short of breath       03/16/2022  yearly f/u ov/[name] re: asthma maint on symbicort 160  Chief Complaint  Patient presents with   Follow-up    Pt states he has no issues since LOV  Dyspnea:  has to walk a bit slower but that's still same pace as others Cough: none  Sleeping: 30 degrees  SABA use: none - never tries pre - treatment prior to exertion  02: none      No obvious day to day or daytime variability or assoc excess/ purulent sputum or mucus plugs or hemoptysis or cp or chest tightness, subjective wheeze or overt sinus or hb symptoms.   Sleeping  without nocturnal  or early am exacerbation  of respiratory  c/o's or need for noct saba. Also denies any obvious fluctuation of symptoms with weather or environmental changes or other aggravating or alleviating factors except as outlined above   No unusual exposure hx or h/o childhood pna/ asthma or knowledge of premature birth.  Current Allergies, Complete Past Medical History, Past Surgical History, Family History, and Social History were reviewed in [employer] record.  ROS  The following are not active complaints unless bolded Hoarseness, sore throat, dysphagia, dental problems, itching, sneezing,  nasal congestion or discharge of excess mucus or purulent secretions, ear ache,   fever, chills, sweats, unintended wt loss or wt gain, classically pleuritic or exertional cp,  orthopnea pnd or arm/hand swelling  or leg swelling, presyncope, palpitations, abdominal pain, anorexia, nausea, vomiting, diarrhea  or change in bowel habits or change in bladder habits, change in stools or change in urine, dysuria, hematuria,  rash, arthralgias, visual complaints, headache, numbness, weakness or ataxia or problems with walking or coordination,  change in mood or  memory.        Current Meds  Medication Sig   acitretin (SORIATANE) 25 MG capsule Take 25 mg by  mouth daily.   albuterol (PROAIR HFA) 108 (90 Base) MCG/ACT inhaler 2 puffs every 4 hours as needed only  if your can't catch your breath (Patient taking differently: Inhale 2 puffs into the lungs every 6 (six) hours as needed for wheezing or shortness of breath.)   amLODipine (NORVASC) 5 MG tablet TAKE 1 TABLET (5 MG TOTAL) BY MOUTH DAILY.   budesonide-formoterol (SYMBICORT) 160-4.5 MCG/ACT inhaler TAKE 2 PUFFS FIRST THING IN AM AND THEN ANOTHER 2 PUFFS ABOUT 12 HOURS LATER.   HYDROcodone-acetaminophen (NORCO) 5-325 MG tablet Take 0.5-1 tablets by mouth every 8 (eight) hours as needed for moderate pain.   latanoprost (XALATAN) 0.005 % ophthalmic solution Place 1 drop into both eyes at bedtime.   methocarbamol (ROBAXIN) 500 MG tablet Take 1 tablet (500 mg total) by mouth every 8 (eight) hours as needed for muscle spasms.   ondansetron (ZOFRAN) 4 MG tablet Take 1 tablet (4 mg total) by mouth every 6 (six) hours as needed for nausea.   valsartan-hydrochlorothiazide (DIOVAN-HCT) 320-12.5 MG tablet TOME UNA TABLETA TODOS LOS DIAS       .       Objective:   Physical Exam   Wts  03/16/2022      139  03/16/2021      138   02/28/2020     146  05/07/2018          152 02/01/2018        148   10/30/2017         143   09/14/17 145 lb 9.6 oz (66 kg)  05/04/17 139 lb 8 oz (63.3 kg)  05/05/16 144 lb (65.3 kg)    Vital signs reviewed  03/16/2022  - Note at rest 02 sats  97% on RA   General appearance:    amb wm nad     HEENT : Oropharynx  clear   Nasal turbinates nl    NECK :  without  apparent JVD/ palpable Nodes/TM    LUNGS: no acc muscle use,  Min barrel  contour chest wall with bilateral  slightly decreased bs s audible wheeze and  without cough on insp or exp maneuvers and min  Hyperresonant  to  percussion bilaterally    CV:  RRR  no s3 or murmur or increase in P2, and no edema   ABD:  soft and nontender with pos end  insp Hoover's  in the supine position.  No bruits or organomegaly  appreciated   MS:  Nl gait/ ext warm without deformities Or obvious joint restrictions  calf tenderness, cyanosis or clubbing     SKIN: warm and dry without lesions    NEURO:  alert, approp, nl sensorium with  no motor  or cerebellar deficits apparent.                  Assessment:

## 2022-03-16 NOTE — Patient Instructions (Signed)
No change in medications   Also  Ok to try albuterol 15 min before an activity (on alternating days)  that you know would usually make you short of breath and see if it makes any difference and if makes none then don't take albuterol after activity unless you can't catch your breath as this means it's the resting that helps, not the albuterol.     Please schedule a follow up visit in 6  months but call sooner if needed

## 2022-04-29 ENCOUNTER — Telehealth: Payer: Self-pay

## 2022-04-29 NOTE — Telephone Encounter (Signed)
[name] stopped by this morning to be seen but I informed him that we didn't have any appointments available. Pt states that for 2 days he has been coughing but can't get anything to come up, congested, and also wheezing. Pt has been taking an OTC cough medication that has been helping some He is also taking   budesonide-formoterol (SYMBICORT) 160-4.5 MCG/ACT inhaler   For the wheezing but that's not helping at all.   Pt wants to know if something can be sent in or recommended for the congestion.   Please advise

## 2022-04-29 NOTE — Telephone Encounter (Signed)
Ok. Thank you.

## 2022-04-29 NOTE — Telephone Encounter (Signed)
I have only seen him once for a sick visit. He is doing everything he should be doing. He should be seen at an urgent care to have testing for covid and flu. He may need a breathing treatment which we cannot do here anyway.

## 2022-04-30 DIAGNOSIS — J45909 Unspecified asthma, uncomplicated: Secondary | ICD-10-CM | POA: Diagnosis not present

## 2022-04-30 DIAGNOSIS — J209 Acute bronchitis, unspecified: Secondary | ICD-10-CM | POA: Diagnosis not present

## 2022-04-30 DIAGNOSIS — R051 Acute cough: Secondary | ICD-10-CM | POA: Diagnosis not present

## 2022-05-02 ENCOUNTER — Other Ambulatory Visit: Payer: Self-pay | Admitting: Hematology & Oncology

## 2022-05-02 DIAGNOSIS — C20 Malignant neoplasm of rectum: Secondary | ICD-10-CM

## 2022-05-03 ENCOUNTER — Ambulatory Visit: Payer: Medicare HMO | Admitting: Nurse Practitioner

## 2022-05-04 ENCOUNTER — Other Ambulatory Visit: Payer: Self-pay | Admitting: *Deleted

## 2022-05-04 DIAGNOSIS — C20 Malignant neoplasm of rectum: Secondary | ICD-10-CM

## 2022-05-05 ENCOUNTER — Inpatient Hospital Stay: Payer: Medicare HMO

## 2022-05-05 ENCOUNTER — Encounter: Payer: Self-pay | Admitting: Hematology & Oncology

## 2022-05-05 ENCOUNTER — Other Ambulatory Visit: Payer: Self-pay

## 2022-05-05 ENCOUNTER — Telehealth: Payer: Self-pay

## 2022-05-05 ENCOUNTER — Inpatient Hospital Stay: Payer: Medicare HMO | Attending: Hematology & Oncology | Admitting: Hematology & Oncology

## 2022-05-05 VITALS — BP 161/93 | HR 101 | Temp 97.8°F | Resp 20 | Ht 68.0 in | Wt 130.4 lb

## 2022-05-05 DIAGNOSIS — Z923 Personal history of irradiation: Secondary | ICD-10-CM | POA: Diagnosis not present

## 2022-05-05 DIAGNOSIS — E7800 Pure hypercholesterolemia, unspecified: Secondary | ICD-10-CM

## 2022-05-05 DIAGNOSIS — R7989 Other specified abnormal findings of blood chemistry: Secondary | ICD-10-CM | POA: Diagnosis not present

## 2022-05-05 DIAGNOSIS — Z85048 Personal history of other malignant neoplasm of rectum, rectosigmoid junction, and anus: Secondary | ICD-10-CM | POA: Diagnosis not present

## 2022-05-05 DIAGNOSIS — Z9221 Personal history of antineoplastic chemotherapy: Secondary | ICD-10-CM | POA: Insufficient documentation

## 2022-05-05 DIAGNOSIS — C20 Malignant neoplasm of rectum: Secondary | ICD-10-CM

## 2022-05-05 LAB — CMP (CANCER CENTER ONLY)
ALT: 100 U/L — ABNORMAL HIGH (ref 0–44)
AST: 69 U/L — ABNORMAL HIGH (ref 15–41)
Albumin: 4.3 g/dL (ref 3.5–5.0)
Alkaline Phosphatase: 55 U/L (ref 38–126)
Anion gap: 11 (ref 5–15)
BUN: 30 mg/dL — ABNORMAL HIGH (ref 8–23)
CO2: 28 mmol/L (ref 22–32)
Calcium: 9.6 mg/dL (ref 8.9–10.3)
Chloride: 94 mmol/L — ABNORMAL LOW (ref 98–111)
Creatinine: 1.02 mg/dL (ref 0.61–1.24)
GFR, Estimated: >60 mL/min (ref >60)
Glucose, Bld: 112 mg/dL — ABNORMAL HIGH (ref 70–99)
Potassium: 3.7 mmol/L (ref 3.5–5.1)
Sodium: 133 mmol/L — ABNORMAL LOW (ref 135–145)
Total Bilirubin: 1 mg/dL (ref 0.3–1.2)
Total Protein: 8 g/dL (ref 6.5–8.1)

## 2022-05-05 LAB — CEA (ACCESS): CEA (CHCC): 1.12 ng/mL (ref 0.00–5.00)

## 2022-05-05 LAB — CBC WITH DIFFERENTIAL (CANCER CENTER ONLY)
Abs Immature Granulocytes: 0.06 10*3/uL (ref 0.00–0.07)
Basophils Absolute: 0 10*3/uL (ref 0.0–0.1)
Basophils Relative: 0 %
Eosinophils Absolute: 0.1 10*3/uL (ref 0.0–0.5)
Eosinophils Relative: 2 %
HCT: 43 % (ref 39.0–52.0)
Hemoglobin: 15.2 g/dL (ref 13.0–17.0)
Immature Granulocytes: 1 %
Lymphocytes Relative: 16 %
Lymphs Abs: 1.2 10*3/uL (ref 0.7–4.0)
MCH: 33 pg (ref 26.0–34.0)
MCHC: 35.3 g/dL (ref 30.0–36.0)
MCV: 93.3 fL (ref 80.0–100.0)
Monocytes Absolute: 0.7 10*3/uL (ref 0.1–1.0)
Monocytes Relative: 9 %
Neutro Abs: 5.4 10*3/uL (ref 1.7–7.7)
Neutrophils Relative %: 72 %
Platelet Count: 280 10*3/uL (ref 150–400)
RBC: 4.61 MIL/uL (ref 4.22–5.81)
RDW: 12 % (ref 11.5–15.5)
WBC Count: 7.5 10*3/uL (ref 4.0–10.5)
nRBC: 0 % (ref 0.0–0.2)

## 2022-05-05 LAB — LIPID PANEL
Cholesterol: 191 mg/dL (ref 0–200)
HDL: 55 mg/dL (ref >40)
LDL Cholesterol: 118 mg/dL — ABNORMAL HIGH (ref 0–99)
Total CHOL/HDL Ratio: 3.5 RATIO
Triglycerides: 92 mg/dL (ref <150)
VLDL: 18 mg/dL (ref 0–40)

## 2022-05-05 LAB — LACTATE DEHYDROGENASE: LDH: 186 U/L (ref 98–192)

## 2022-05-05 NOTE — Telephone Encounter (Signed)
Called and informed patient of lab results, patient verbalized understanding and denies any questions or concerns at this time.   

## 2022-05-05 NOTE — Telephone Encounter (Signed)
-----   Message from Volanda Napoleon, MD sent at 05/05/2022 12:34 PM EST ----- Call and tell him that his cholesterol is okay.  Thanks.[name]
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
## 2022-05-06 ENCOUNTER — Telehealth: Payer: Self-pay

## 2022-05-06 NOTE — Telephone Encounter (Signed)
Called and informed patient of lab results, patient verbalized understanding and denies any questions or concerns at this time.   

## 2022-05-06 NOTE — Telephone Encounter (Signed)
-----   Message from Volanda Napoleon, MD sent at 05/05/2022  4:41 PM EST ----- Call and let him know that the tumor marker is absolutely normal at 1.12.  Thanks.  Laurey Arrow

## 2022-05-18 DIAGNOSIS — W268XXA Contact with other sharp object(s), not elsewhere classified, initial encounter: Secondary | ICD-10-CM | POA: Diagnosis not present

## 2022-05-18 DIAGNOSIS — S81801A Unspecified open wound, right lower leg, initial encounter: Secondary | ICD-10-CM | POA: Diagnosis not present

## 2022-06-16 DIAGNOSIS — Z79899 Other long term (current) drug therapy: Secondary | ICD-10-CM | POA: Diagnosis not present

## 2022-06-16 DIAGNOSIS — Z85828 Personal history of other malignant neoplasm of skin: Secondary | ICD-10-CM | POA: Diagnosis not present

## 2022-06-16 DIAGNOSIS — L400 Psoriasis vulgaris: Secondary | ICD-10-CM | POA: Diagnosis not present

## 2022-08-26 ENCOUNTER — Ambulatory Visit (INDEPENDENT_AMBULATORY_CARE_PROVIDER_SITE_OTHER): Payer: Medicare HMO | Admitting: Nurse Practitioner

## 2022-08-26 ENCOUNTER — Encounter: Payer: Self-pay | Admitting: Nurse Practitioner

## 2022-08-26 VITALS — BP 139/84 | HR 61 | Ht 68.0 in | Wt 141.4 lb

## 2022-08-26 DIAGNOSIS — I10 Essential (primary) hypertension: Secondary | ICD-10-CM | POA: Diagnosis not present

## 2022-08-26 DIAGNOSIS — J4540 Moderate persistent asthma, uncomplicated: Secondary | ICD-10-CM

## 2022-08-26 MED ORDER — VALSARTAN-HYDROCHLOROTHIAZIDE 320-12.5 MG PO TABS: 1.0000 | ORAL_TABLET | Freq: Every day | ORAL | 1 refills | Status: DC

## 2022-08-26 MED ORDER — AMLODIPINE BESYLATE 5 MG PO TABS: 5.0000 mg | ORAL_TABLET | Freq: Every day | ORAL | 1 refills | Status: DC

## 2022-08-26 NOTE — Progress Notes (Signed)
B  Established patient visit   Patient: [name]   DOB: [birth year]   89 y.o. Male  MRN: [id number] Visit Date: 08/26/2022   Chief Complaint  Patient presents with   Medical Management of Chronic Issues   Subjective    HPI  Follow up  -hypertension  --generally well controlled  -COPD --he does see pulmonology  --no unusual shortness of breath reported. -He denies chest pain, chest pressure, or shortness of breath. He denies headaches or visual disturbances. He denies abdominal pain, nausea, vomiting, or changes in bowel or bladder habits.     Medications: Outpatient Medications Prior to Visit  Medication Sig   acitretin (SORIATANE) 25 MG capsule Take 25 mg by mouth daily.   albuterol (PROAIR HFA) 108 (90 Base) MCG/ACT inhaler 2 puffs every 4 hours as needed only  if your can't catch your breath (Patient taking differently: Inhale 2 puffs into the lungs every 6 (six) hours as needed for wheezing or shortness of breath.)   HYDROcodone-acetaminophen (NORCO) 5-325 MG tablet Take 0.5-1 tablets by mouth every 8 (eight) hours as needed for moderate pain.   latanoprost (XALATAN) 0.005 % ophthalmic solution Place 1 drop into both eyes at bedtime.   methocarbamol (ROBAXIN) 500 MG tablet Take 1 tablet (500 mg total) by mouth every 8 (eight) hours as needed for muscle spasms.   ondansetron (ZOFRAN) 4 MG tablet Take 1 tablet (4 mg total) by mouth every 6 (six) hours as needed for nausea.   [DISCONTINUED] amLODipine (NORVASC) 5 MG tablet TAKE 1 TABLET (5 MG TOTAL) BY MOUTH DAILY.   [DISCONTINUED] budesonide-formoterol (SYMBICORT) 160-4.5 MCG/ACT inhaler TAKE 2 PUFFS FIRST THING IN AM AND THEN ANOTHER 2 PUFFS ABOUT 12 HOURS LATER. (Patient not taking: Reported on 09/14/2022)   [DISCONTINUED] valsartan-hydrochlorothiazide (DIOVAN-HCT) 320-12.5 MG tablet TOME UNA TABLETA TODOS LOS DIAS   No facility-administered medications prior to visit.    Review of Systems See HPI     Last CBC Lab  Results  Component Value Date   WBC 7.5 05/05/2022   HGB 15.2 05/05/2022   HCT 43.0 05/05/2022   MCV 93.3 05/05/2022   MCH 33.0 05/05/2022   RDW 12.0 05/05/2022   PLT 280 05/05/2022   Last metabolic panel Lab Results  Component Value Date   GLUCOSE 112 (H) 05/05/2022   NA 133 (L) 05/05/2022   K 3.7 05/05/2022   CL 94 (L) 05/05/2022   CO2 28 05/05/2022   BUN 30 (H) 05/05/2022   CREATININE 1.02 05/05/2022   GFRNONAA >60 05/05/2022   CALCIUM 9.6 05/05/2022   PHOS 3.3 10/31/2021   PROT 8.0 05/05/2022   ALBUMIN 4.3 05/05/2022   LABGLOB 3.1 08/24/2021   AGRATIO 1.5 08/24/2021   BILITOT 1.0 05/05/2022   ALKPHOS 55 05/05/2022   AST 69 (H) 05/05/2022   ALT 100 (H) 05/05/2022   ANIONGAP 11 05/05/2022   Last lipids Lab Results  Component Value Date   CHOL 191 05/05/2022   HDL 55 05/05/2022   LDLCALC 118 (H) 05/05/2022   TRIG 92 05/05/2022   CHOLHDL 3.5 05/05/2022   Last hemoglobin A1c Lab Results  Component Value Date   HGBA1C 5.3 02/03/2021   Last thyroid functions Lab Results  Component Value Date   TSH 2.309 10/29/2021       Objective     Today's Vitals   08/26/22 0817 08/26/22 0903  BP: (Abnormal) 148/68 139/84  Pulse: 61   SpO2: 96%   Weight: 141 lb 6.4 oz (64.1 kg)  Height: 5\' 8"  (1.727 m)    Body mass index is 21.5 kg/m.  BP Readings from Last 3 Encounters:  09/14/22 138/60  08/26/22 139/84  05/05/22 (Abnormal) 161/93    Wt Readings from Last 3 Encounters:  09/15/22 139 lb (63 kg)  09/14/22 139 lb 9.6 oz (63.3 kg)  08/26/22 141 lb 6.4 oz (64.1 kg)    Physical Exam Vitals and nursing note reviewed.  Constitutional:      Appearance: Normal appearance. He is well-developed.  HENT:     Head: Normocephalic and atraumatic.     Nose: Nose normal.     Mouth/Throat:     Mouth: Mucous membranes are moist.     Pharynx: Oropharynx is clear.  Eyes:     Extraocular Movements: Extraocular movements intact.     Conjunctiva/sclera: Conjunctivae  normal.     Pupils: Pupils are equal, round, and reactive to light.  Neck:     Vascular: No carotid bruit.  Cardiovascular:     Rate and Rhythm: Normal rate and regular rhythm.     Pulses: Normal pulses.     Heart sounds: Normal heart sounds.  Pulmonary:     Effort: Pulmonary effort is normal.     Breath sounds: Normal breath sounds.  Abdominal:     Palpations: Abdomen is soft.  Musculoskeletal:        General: Normal range of motion.     Cervical back: Normal range of motion and neck supple.  Lymphadenopathy:     Cervical: No cervical adenopathy.  Skin:    General: Skin is warm and dry.     Capillary Refill: Capillary refill takes less than 2 seconds.  Neurological:     General: No focal deficit present.     Mental Status: He is alert and oriented to person, place, and time.  Psychiatric:        Mood and Affect: Mood normal.        Behavior: Behavior normal.        Thought Content: Thought content normal.        Judgment: Judgment normal.       Assessment & Plan    Essential hypertension Assessment & Plan: Stable.  -Continue current medication.   Orders: -     amLODIPine Besylate; Take 1 tablet (5 mg total) by mouth daily.  Dispense: 90 tablet; Refill: 1 -     Valsartan-hydroCHLOROthiazide; Take 1 tablet by mouth daily.  Dispense: 90 tablet; Refill: 1  Chronic asthma, moderate persistent, uncomplicated Assessment & Plan: Stable.  -continue all inhalers and respiratory medications as prescribed       Return in about 6 months (around 02/25/2023) for blood pressure, FBW a week prior to visit. will also need MWV in 6 or so months. Carlean Jews, NP  [facility] [phone] (phone) [phone] (fax)  [facility]

## 2022-09-05 DIAGNOSIS — L308 Other specified dermatitis: Secondary | ICD-10-CM | POA: Diagnosis not present

## 2022-09-05 DIAGNOSIS — L858 Other specified epidermal thickening: Secondary | ICD-10-CM | POA: Diagnosis not present

## 2022-09-05 DIAGNOSIS — Z85828 Personal history of other malignant neoplasm of skin: Secondary | ICD-10-CM | POA: Diagnosis not present

## 2022-09-05 DIAGNOSIS — L821 Other seborrheic keratosis: Secondary | ICD-10-CM | POA: Diagnosis not present

## 2022-09-12 NOTE — Progress Notes (Unsigned)
Subjective:     Patient ID: [name], male   DOB: 1930-08-04    MRN: [id number]    Brief patient profile:  25 yowm never smoker with hx suggestive of longstanding asthma and  h/o cough attributed to to gerd and cc  variable sob/wheeze  for which inhalers seem  to help referred to pulmonary clinic 09/14/2017 by Dr Jeannetta Nap with worse noct wheeze ? since starting ACEi and refractory to symb  160 dosed at 2bid though baseline hfa technique suboptimal.     History of Present Illness  09/14/2017 1st [facility] office visit/ [name]   Chief Complaint  Patient presents with   Consult    SOB with excertion. wheezing at night.   doe x decades worse with fence post splitting and rapid walking assoc with noct wheeze while on ACEi and better with  use of Symbicort esp at bedtime but also used with "wheezing"  Am of ov (w/in 12 hours of last symb 160  dose)  rec Symbicort 160 can be used up to 2 puffs every 12 hours Work on inhaler technique:   Stop lisinopril -hctz and start diovan 160-25 mg daily - call if problems  Please schedule a follow up office visit in 6 weeks, call sooner if needed with all medications /inhalers/ solutions in hand so we can verify exactly what you are taking. This includes all medications from all doctors and over the counters  - PFTs on return       03/16/2021  f/u ov/[name] re: mod asthma  maint on symbicort 160 2bid   Chief Complaint  Patient presents with   Follow-up    Sob with exertion, at baseline.  Rec Plan A = Automatic = Always=   Symbicort 160 Take 2 puffs first thing in am and then another 2 puffs about 12 hours later  Work on inhaler technique   Plan B = Backup (to supplement plan A, not to replace it) Only use your albuterol inhaler as a rescue medication  Ok to try albuterol 15 min before an activity (on alternating days)  that you know would usually make you short of breath       03/16/2022  yearly f/u ov/[name] re: asthma maint on symbicort  160  Chief Complaint  Patient presents with   Follow-up    Pt states he has no issues since LOV  Dyspnea:  has to walk a bit slower but that's still same pace as others Cough: none  Sleeping: 30 degrees  SABA use: none - never tries pre - treatment prior to exertion  02: none  Rec No change in medications  Also  Ok to try albuterol 15 min before an activity (on alternating days)  that you know would usually make you short of breath        09/14/2022  20m f/u ov/[name] re: asthma  maint on symbicort 160 did not bring inhalers  Chief Complaint  Patient presents with   Follow-up    Doing well.  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   sleep without nocturnal  or early am  exacerbation  of respiratory  c/o's or need for noct saba. Also denies any obvious fluctuation of symptoms with weather or environmental changes or other aggravating or alleviating factors except as outlined above   No unusual exposure hx or h/o childhood pna/ asthma or knowledge of premature birth.  Current Allergies, Complete Past Medical History, Past Surgical History, Family History, and Social History were reviewed in [employer] record.  ROS  The following are not active complaints unless bolded Hoarseness, sore throat, dysphagia, dental problems, itching, sneezing,  nasal congestion or discharge of excess mucus or purulent secretions, ear ache,   fever, chills, sweats, unintended wt loss or wt gain, classically pleuritic or exertional cp,  orthopnea pnd or arm/hand swelling  or leg swelling, presyncope, palpitations, abdominal pain, anorexia, nausea,  vomiting, diarrhea  or change in bowel habits or change in bladder habits, change in stools or change in urine, dysuria, hematuria,  rash, arthralgias, visual complaints, headache, numbness, weakness or ataxia or problems with walking or coordination,  change in mood or  memory.        Current Meds  Medication Sig   acitretin (SORIATANE) 25 MG capsule Take 25 mg by mouth daily.   albuterol (PROAIR HFA) 108 (90 Base) MCG/ACT inhaler 2 puffs every 4 hours as needed only  if your can't catch your breath (Patient taking differently: Inhale 2 puffs into the lungs every 6 (six) hours as needed for wheezing or shortness of breath.)   amLODipine (NORVASC) 5 MG tablet Take 1 tablet (5 mg total) by mouth daily.   HYDROcodone-acetaminophen (NORCO) 5-325 MG tablet Take 0.5-1 tablets by mouth every 8 (eight) hours as needed for moderate pain.   latanoprost (XALATAN) 0.005 % ophthalmic solution Place 1 drop into both eyes at bedtime.   methocarbamol (ROBAXIN) 500 MG tablet Take 1 tablet (500 mg total) by mouth every 8 (eight) hours as needed for muscle spasms.   ondansetron (ZOFRAN) 4 MG tablet Take 1 tablet (4 mg total) by mouth every 6 (six) hours as needed for nausea.   valsartan-hydrochlorothiazide (DIOVAN-HCT) 320-12.5 MG tablet Take 1 tablet by mouth daily.                Objective:   Physical Exam   Wts  09/14/2022        139 03/16/2022      139  03/16/2021      138   02/28/2020     146  05/07/2018          152 02/01/2018        148   10/30/2017         143   09/14/17 145 lb 9.6 oz (66 kg)  05/04/17 139 lb 8 oz (63.3 kg)  05/05/16 144 lb (65.3 kg)    Vital signs reviewed  09/14/2022  - Note at rest 02 sats  96% on RA   General appearance:    amb wm nad      HEENT : Oropharynx  clear / scarred R eyelid      NECK :  without  apparent JVD/ palpable Nodes/TM    LUNGS: no acc muscle use,  Min barrel  contour chest wall with bilateral  slightly decreased bs s audible wheeze and  without  cough on insp or exp maneuvers and min  Hyperresonant  to  percussion bilaterally    CV:  RRR  no s3 or murmur or increase in P2, and no edema   ABD:  soft and nontender    MS:  Nl gait/ ext warm without deformities Or obvious joint restrictions  calf tenderness, cyanosis or clubbing     SKIN: warm and dry without lesions    NEURO:  alert, approp, nl sensorium with  no motor or cerebellar deficits apparent.                  Assessment:

## 2022-09-13 ENCOUNTER — Telehealth: Payer: Self-pay

## 2022-09-13 NOTE — Telephone Encounter (Signed)
Contacted [name] to schedule their annual wellness visit. Appointment made for 09/15/22.  [name], CMA (AAMA)  [facility] [phone]

## 2022-09-14 ENCOUNTER — Ambulatory Visit: Payer: Medicare HMO | Admitting: Internal Medicine

## 2022-09-14 ENCOUNTER — Encounter: Payer: Self-pay | Admitting: Internal Medicine

## 2022-09-14 VITALS — BP 138/60 | HR 52 | Temp 97.6°F | Ht 67.5 in | Wt 139.6 lb

## 2022-09-14 DIAGNOSIS — J4540 Moderate persistent asthma, uncomplicated: Secondary | ICD-10-CM | POA: Diagnosis not present

## 2022-09-14 MED ORDER — BUDESONIDE-FORMOTEROL FUMARATE 160-4.5 MCG/ACT IN AERO: INHALATION_SPRAY | RESPIRATORY_TRACT | Status: DC

## 2022-09-14 NOTE — Assessment & Plan Note (Addendum)
Never smoker PFT's  10/06/09   FEV1 2.56 (100 % ) ratio 51  p no % improvement from saba p ? prior to study with DLCO  116%  - PFT's  10/30/2017  FEV1 1.80 (39 % ) ratio 55   p 39 % improvement from saba p nothing prior to study with DLCO  72 % corrects to 89 % for alv volume   - 10/30/2017  symbicort 160   2 bid - 02/01/2018  After extensive coaching inhaler device,  effectiveness =    75% from baseline 25% (very late insp p trigger, short Ti) - Spirometry 02/01/2018  FEV1 1.3 (59%)  Ratio 56 p am symb 160 with poor hfa   - Allergy profile 02/01/2018 >  Eos 0.3/  IgE  35 RAST neg  - Alpha one AT rec 05/07/2018  > declined  - 02/28/2020  After extensive coaching inhaler device,  effectiveness =    50% (delayed trigger, good Ti)  > rx spacer  - 03/16/2021  After extensive coaching inhaler device,  effectiveness =  80% (short ti)   - Spirometry 03/16/2022  FEV1 1.38 (59%)  Ratio 0.67 with min curvature p am symb 80  - 09/14/2022 attempted FENO could not do   - The proper method of use, as well as anticipated side effects, of a metered-dose inhaler were discussed and demonstrated to the patient using teach back method.    All goals of chronic asthma control met including optimal function and elimination of symptoms with minimal need for rescue therapy.  Contingencies discussed in full including contacting this office immediately if not controlling the symptoms using the rule of two's.     F/u can be yearly, sooner prn          Each maintenance medication was reviewed in detail including emphasizing most importantly the difference between maintenance and prns and under what circumstances the prns are to be triggered using an action plan format where appropriate.  Total time for H and P, chart review, counseling, r and generating customized AVS unique to this office visit / same day charting = 20 min

## 2022-09-14 NOTE — Patient Instructions (Signed)
No change in medications   Please schedule a follow up visit in 12  months but call sooner if needed - bring inhalers

## 2022-09-15 ENCOUNTER — Ambulatory Visit (INDEPENDENT_AMBULATORY_CARE_PROVIDER_SITE_OTHER): Payer: Medicare HMO

## 2022-09-15 VITALS — Ht 67.5 in | Wt 139.0 lb

## 2022-09-15 DIAGNOSIS — Z0000 Encounter for general adult medical examination without abnormal findings: Secondary | ICD-10-CM | POA: Diagnosis not present

## 2022-09-15 NOTE — Patient Instructions (Addendum)
[name] , Thank you for taking time to come for your Medicare Wellness Visit. I appreciate your ongoing commitment to your health goals. Please review the following plan we discussed and let me know if I can assist you in the future.   These are the goals we discussed:  Goals       No current goals (pt-stated)        This is a list of the screening recommended for you and due dates:  Health Maintenance  Topic Date Due   COVID-19 Vaccine (4 - 2023-24 season) 10/01/2022*   Pneumonia Vaccine (2 of 2 - PPSV23 or PCV20) 09/15/2023*   Flu Shot  12/01/2022   Medicare Annual Wellness Visit  09/15/2023   DTaP/Tdap/Td vaccine (2 - Td or Tdap) 10/29/2031   Zoster (Shingles) Vaccine  Completed   HPV Vaccine  Aged Out  *Topic was postponed. The date shown is not the original due date.    Advanced directives: Please bring a copy of your health care power of attorney and living will to the office to be added to your chart at your convenience.   Conditions/risks identified: None  Next appointment: Follow up in one year for your annual wellness visit.   Preventive Care 87 Years and Older, Male  Preventive care refers to lifestyle choices and visits with your health care provider that can promote health and wellness. What does preventive care include? A yearly physical exam. This is also called an annual well check. Dental exams once or twice a year. Routine eye exams. Ask your health care provider how often you should have your eyes checked. Personal lifestyle choices, including: Daily care of your teeth and gums. Regular physical activity. Eating a healthy diet. Avoiding tobacco and drug use. Limiting alcohol use. Practicing safe sex. Taking low doses of aspirin every day. Taking vitamin and mineral supplements as recommended by your health care provider. What happens during an annual well check? The services and screenings done by your health care provider during your annual well  check will depend on your age, overall health, lifestyle risk factors, and family history of disease. Counseling  Your health care provider may ask you questions about your: Alcohol use. Tobacco use. Drug use. Emotional well-being. Home and relationship well-being. Sexual activity. Eating habits. History of falls. Memory and ability to understand (cognition). Work and work Astronomer. Screening  You may have the following tests or measurements: Height, weight, and BMI. Blood pressure. Lipid and cholesterol levels. These may be checked every 5 years, or more frequently if you are over 1 years old. Skin check. Lung cancer screening. You may have this screening every year starting at age 89 if you have a 30-pack-year history of smoking and currently smoke or have quit within the past 15 years. Fecal occult blood test (FOBT) of the stool. You may have this test every year starting at age 68. Flexible sigmoidoscopy or colonoscopy. You may have a sigmoidoscopy every 5 years or a colonoscopy every 10 years starting at age 43. Prostate cancer screening. Recommendations will vary depending on your family history and other risks. Hepatitis C blood test. Hepatitis B blood test. Sexually transmitted disease (STD) testing. Diabetes screening. This is done by checking your blood sugar (glucose) after you have not eaten for a while (fasting). You may have this done every 1-3 years. Abdominal aortic aneurysm (AAA) screening. You may need this if you are a current or former smoker. Osteoporosis. You may be screened starting at age  70 if you are at high risk. Talk with your health care provider about your test results, treatment options, and if necessary, the need for more tests. Vaccines  Your health care provider may recommend certain vaccines, such as: Influenza vaccine. This is recommended every year. Tetanus, diphtheria, and acellular pertussis (Tdap, Td) vaccine. You may need a Td booster  every 10 years. Zoster vaccine. You may need this after age 72. Pneumococcal 13-valent conjugate (PCV13) vaccine. One dose is recommended after age 43. Pneumococcal polysaccharide (PPSV23) vaccine. One dose is recommended after age 49. Talk to your health care provider about which screenings and vaccines you need and how often you need them. This information is not intended to replace advice given to you by your health care provider. Make sure you discuss any questions you have with your health care provider. Document Released: 05/15/2015 Document Revised: 01/06/2016 Document Reviewed: 02/17/2015 Elsevier Interactive Patient Education  2017 [place] Prevention in the Home Falls can cause injuries. They can happen to people of all ages. There are many things you can do to make your home safe and to help prevent falls. What can I do on the outside of my home? Regularly fix the edges of walkways and driveways and fix any cracks. Remove anything that might make you trip as you walk through a door, such as a raised step or threshold. Trim any bushes or trees on the path to your home. Use bright outdoor lighting. Clear any walking paths of anything that might make someone trip, such as rocks or tools. Regularly check to see if handrails are loose or broken. Make sure that both sides of any steps have handrails. Any raised decks and porches should have guardrails on the edges. Have any leaves, snow, or ice cleared regularly. Use sand or salt on walking paths during winter. Clean up any spills in your garage right away. This includes oil or grease spills. What can I do in the bathroom? Use night lights. Install grab bars by the toilet and in the tub and shower. Do not use towel bars as grab bars. Use non-skid mats or decals in the tub or shower. If you need to sit down in the shower, use a plastic, non-slip stool. Keep the floor dry. Clean up any water that spills on the floor as soon  as it happens. Remove soap buildup in the tub or shower regularly. Attach bath mats securely with double-sided non-slip rug tape. Do not have throw rugs and other things on the floor that can make you trip. What can I do in the bedroom? Use night lights. Make sure that you have a light by your bed that is easy to reach. Do not use any sheets or blankets that are too big for your bed. They should not hang down onto the floor. Have a firm chair that has side arms. You can use this for support while you get dressed. Do not have throw rugs and other things on the floor that can make you trip. What can I do in the kitchen? Clean up any spills right away. Avoid walking on wet floors. Keep items that you use a lot in easy-to-reach places. If you need to reach something above you, use a strong step stool that has a grab bar. Keep electrical cords out of the way. Do not use floor polish or wax that makes floors slippery. If you must use wax, use non-skid floor wax. Do not have throw rugs and other  things on the floor that can make you trip. What can I do with my stairs? Do not leave any items on the stairs. Make sure that there are handrails on both sides of the stairs and use them. Fix handrails that are broken or loose. Make sure that handrails are as long as the stairways. Check any carpeting to make sure that it is firmly attached to the stairs. Fix any carpet that is loose or worn. Avoid having throw rugs at the top or bottom of the stairs. If you do have throw rugs, attach them to the floor with carpet tape. Make sure that you have a light switch at the top of the stairs and the bottom of the stairs. If you do not have them, ask someone to add them for you. What else can I do to help prevent falls? Wear shoes that: Do not have Leonardo heels. Have rubber bottoms. Are comfortable and fit you well. Are closed at the toe. Do not wear sandals. If you use a stepladder: Make sure that it is fully  opened. Do not climb a closed stepladder. Make sure that both sides of the stepladder are locked into place. Ask someone to hold it for you, if possible. Clearly mark and make sure that you can see: Any grab bars or handrails. First and last steps. Where the edge of each step is. Use tools that help you move around (mobility aids) if they are needed. These include: Canes. Walkers. Scooters. Crutches. Turn on the lights when you go into a dark area. Replace any light bulbs as soon as they burn out. Set up your furniture so you have a clear path. Avoid moving your furniture around. If any of your floors are uneven, fix them. If there are any pets around you, be aware of where they are. Review your medicines with your doctor. Some medicines can make you feel dizzy. This can increase your chance of falling. Ask your doctor what other things that you can do to help prevent falls. This information is not intended to replace advice given to you by your health care provider. Make sure you discuss any questions you have with your health care provider. Document Released: 02/12/2009 Document Revised: 09/24/2015 Document Reviewed: 05/23/2014 Elsevier Interactive Patient Education  2017 [employer].

## 2022-09-15 NOTE — Progress Notes (Signed)
Subjective:   [name] is a 89 y.o. male who presents for Medicare Annual/Subsequent preventive examination.  Review of Systems    Virtual Visit via Telephone Note  I connected with  [name] on 09/15/22 at  3:00 PM EDT by telephone and verified that I am speaking with the correct person using two identifiers.  Location: Patient: Home  Provider: Office Persons participating in the virtual visit: patient/Nurse Health Advisor   I discussed the limitations, risks, security and privacy concerns of performing an evaluation and management service by telephone and the availability of in person appointments. The patient expressed understanding and agreed to proceed.  Interactive audio and video telecommunications were attempted between this nurse and patient, however failed, due to patient having technical difficulties OR patient did not have access to video capability.  We continued and completed visit with audio only.  Some vital signs may be absent or patient reported.   Tillie Rung, LPN  Cardiac Risk Factors include: advanced age (>78men, >35 women);male gender;hypertension     Objective:    Today's Vitals   09/15/22 1510  Weight: 139 lb (63 kg)  Height: 5' 7.5" (1.715 m)   Body mass index is 21.45 kg/m.     09/15/2022    3:19 PM 05/05/2022    8:07 AM 10/28/2021    6:44 PM 10/28/2021   10:45 AM 05/05/2020    7:58 AM 05/06/2019    8:09 AM 05/04/2018    8:03 AM  Advanced Directives  Does Patient Have a Medical Advance Directive? Yes No Yes Yes Yes Yes Yes  Type of Estate agent of [place];Living will  Out of facility DNR (pink MOST or yellow form) Healthcare Power of [place];Living will Healthcare Power of [place];Living will Healthcare Power of [place];Living will Living will;Healthcare Power of Attorney  Does patient want to make changes to medical advance directive?   No - Patient declined  No - Patient declined No - Patient declined    Copy of Healthcare Power of Attorney in Chart? No - copy requested    No - copy requested    Would patient like information on creating a medical advance directive?  No - Patient declined       Pre-existing out of facility DNR order (yellow form or pink MOST form)   Physician notified to receive inpatient order        Current Medications (verified) Outpatient Encounter Medications as of 09/15/2022  Medication Sig   acitretin (SORIATANE) 25 MG capsule Take 25 mg by mouth daily.   albuterol (PROAIR HFA) 108 (90 Base) MCG/ACT inhaler 2 puffs every 4 hours as needed only  if your can't catch your breath (Patient taking differently: Inhale 2 puffs into the lungs every 6 (six) hours as needed for wheezing or shortness of breath.)   amLODipine (NORVASC) 5 MG tablet Take 1 tablet (5 mg total) by mouth daily.   budesonide-formoterol (SYMBICORT) 160-4.5 MCG/ACT inhaler Take 2 puffs first thing in am and then another 2 puffs about 12 hours later.   HYDROcodone-acetaminophen (NORCO) 5-325 MG tablet Take 0.5-1 tablets by mouth every 8 (eight) hours as needed for moderate pain.   latanoprost (XALATAN) 0.005 % ophthalmic solution Place 1 drop into both eyes at bedtime.   methocarbamol (ROBAXIN) 500 MG tablet Take 1 tablet (500 mg total) by mouth every 8 (eight) hours as needed for muscle spasms.   ondansetron (ZOFRAN) 4 MG tablet Take 1 tablet (4 mg total) by mouth  every 6 (six) hours as needed for nausea.   valsartan-hydrochlorothiazide (DIOVAN-HCT) 320-12.5 MG tablet Take 1 tablet by mouth daily.   No facility-administered encounter medications on file as of 09/15/2022.    Allergies (verified) Patient has no known allergies.   History: Past Medical History:  Diagnosis Date   Asthma    Depression    wife died 2 months ago. 2011-05-01  Eyelid cancer    Hyperlipidemia, mixed 05/06/2015   Hypertension    Rectal cancer (HCC) 1999   Past Surgical History:  Procedure Laterality Date   COLON  RESECTION  1999   cyst eyelid  2012   HAND TUMOR EXCISION  2010   right   Family History  Problem Relation Age of Onset   Hypertension Father    Stroke Father    Suicidality Brother    Colon cancer Neg Hx    Rectal cancer Neg Hx    Social History   Socioeconomic History   Marital status: Married    Spouse name: Not on file   Number of children: Not on file   Years of education: Not on file   Highest education level: Not on file  Occupational History   Not on file  Tobacco Use   Smoking status: Never   Smokeless tobacco: Never   Tobacco comments:    Never Used Tobacco  Vaping Use   Vaping Use: Never used  Substance and Sexual Activity   Alcohol use: Yes    Alcohol/week: 16.0 standard drinks of alcohol    Types: 16 Cans of beer per week   Drug use: No   Sexual activity: Not Currently  Other Topics Concern   Not on file  Social History Narrative   Not on file   Social Determinants of Health   Financial Resource Strain: Low Risk  (09/15/2022)   Overall Financial Resource Strain (CARDIA)    Difficulty of Paying Living Expenses: Not hard at all  Food Insecurity: No Food Insecurity (09/15/2022)   Hunger Vital Sign    Worried About Running Out of Food in the Last Year: Never true    Ran Out of Food in the Last Year: Never true  Transportation Needs: No Transportation Needs (09/15/2022)   PRAPARE - Administrator, Civil Service (Medical): No    Lack of Transportation (Non-Medical): No  Physical Activity: Sufficiently Active (09/15/2022)   Exercise Vital Sign    Days of Exercise per Week: 5 days    Minutes of Exercise per Session: 50 min  Stress: No Stress Concern Present (09/15/2022)   [employer] of Occupational Health - Occupational Stress Questionnaire    Feeling of Stress : Not at all  Social Connections: Moderately Integrated (09/15/2022)   Social Connection and Isolation Panel [NHANES]    Frequency of Communication with Friends and Family: More  than three times a week    Frequency of Social Gatherings with Friends and Family: More than three times a week    Attends Religious Services: More than 4 times per year    Active Member of [employer] or Organizations: Yes    Attends Banker Meetings: More than 4 times per year    Marital Status: Widowed    Tobacco Counseling Counseling given: Not Answered Tobacco comments: Never Used Tobacco   Clinical Intake:  Pre-visit preparation completed: No  Pain : No/denies pain     BMI - recorded: 21.45 Nutritional Status: BMI of 19-24  Normal Nutritional Risks: None Diabetes:  No  How often do you need to have someone help you when you read instructions, pamphlets, or other written materials from your doctor or pharmacy?: 1 - Never  Diabetic?  No  Interpreter Needed?: No  Information entered by :: Theresa Mulligan LPN   Activities of Daily Living    09/15/2022    3:17 PM 12/23/2021    2:55 PM  In your present state of health, do you have any difficulty performing the following activities:  Hearing? 0 0  Vision? 0 0  Difficulty concentrating or making decisions? 0 0  Walking or climbing stairs? 0 0  Dressing or bathing? 0 0  Doing errands, shopping? 0 0  Preparing Food and eating ? N   Using the Toilet? N   In the past six months, have you accidently leaked urine? N   Do you have problems with loss of bowel control? N   Managing your Medications? N   Managing your Finances? N   Housekeeping or managing your Housekeeping? N     Patient Care Team: Melida Quitter, PA as PCP - General (Family Medicine) Kaleen Mask, MD as Referring Physician (Family Medicine) Nyoka Cowden, MD as Consulting Physician (Pulmonary Disease) Mathews Robinsons, MD as Consulting Physician (Dermatology) Robyn Haber, Karl Luke, MD as Referring Physician (Ophthalmology) Arminda Resides, MD as Consulting Physician (Dermatology)  Indicate any recent Medical Services you may  have received from other than [facility] providers in the past year (date may be approximate).     Assessment:   This is a routine wellness examination for [place].  Hearing/Vision screen Hearing Screening - Comments:: Denies hearing difficulties   Vision Screening - Comments::  - up to date with routine eye exams with  [facility]  Dietary issues and exercise activities discussed: Current Exercise Habits: Home exercise routine, Type of exercise: walking, Time (Minutes): 30, Frequency (Times/Week): 5, Weekly Exercise (Minutes/Week): 150, Intensity: Moderate, Exercise limited by: None identified   Goals Addressed               This Visit's Progress     No current goals (pt-stated)         Depression Screen    09/15/2022    3:16 PM 08/26/2022    8:20 AM 02/23/2022    9:08 AM 12/23/2021    2:55 PM 10/27/2021    9:08 AM 08/24/2021    8:31 AM 02/03/2021    8:38 AM  PHQ 2/9 Scores  PHQ - 2 Score 0 0 3 0 0 0 0  PHQ- 9 Score 0 0 4 0 0 0 0    Fall Risk    09/15/2022    3:18 PM 08/26/2022    8:19 AM 02/23/2022    8:59 AM 12/23/2021    2:55 PM 10/27/2021    9:08 AM  Fall Risk   Falls in the past year? 0 0 0 0 0  Number falls in past yr: 0 0 0 0 0  Injury with Fall? 0 0 0 0 0  Risk for fall due to : No Fall Risks No Fall Risks No Fall Risks History of fall(s) No Fall Risks  Follow up Falls prevention discussed Falls evaluation completed Falls evaluation completed Falls evaluation completed Falls evaluation completed    FALL RISK PREVENTION PERTAINING TO THE HOME:  Any stairs in or around the home? Yes  If so, are there any without handrails? No  Home free of loose throw rugs in walkways, pet beds, electrical cords, etc?  Yes  Adequate lighting in your home to reduce risk of falls? Yes   ASSISTIVE DEVICES UTILIZED TO PREVENT FALLS:  Life alert? No  Use of a cane, walker or w/c? No  Grab bars in the bathroom? No  Shower chair or bench in shower? No  Elevated toilet seat or a  handicapped toilet? No   TIMED UP AND GO:  Was the test performed? No . Audio Visit   Cognitive Function:        09/15/2022    3:19 PM 02/23/2022    8:54 AM 02/03/2021    8:39 AM  6CIT Screen  What Year? 0 points 0 points 0 points  What month? 0 points 0 points 0 points  What time? 0 points 0 points 0 points  Count back from 20 0 points 0 points 0 points  Months in reverse 0 points 0 points 0 points  Repeat phrase 0 points 0 points 0 points  Total Score 0 points 0 points 0 points    Immunizations Immunization History  Administered Date(s) Administered   Fluad Quad(high Dose 65+) 01/21/2019, 01/29/2020, 02/03/2021, 02/23/2022   Hepatitis B, ADULT 01/07/2018   Influenza Split 12/31/2016   Influenza-Unspecified 02/05/2014, 12/31/2017, 01/21/2019, 01/21/2020   Moderna Sars-Covid-2 Vaccination 05/13/2019, 06/10/2019, 02/21/2020   Pneumococcal Conjugate-13 11/05/2013   Tdap 10/28/2021   Zoster Recombinat (Shingrix) 09/03/2017, 11/16/2017   Zoster, Live 08/30/2017    TDAP status: Up to date  Flu Vaccine status: Up to date  Pneumococcal vaccine status: Up to date  Covid-19 vaccine status: Completed vaccines  Qualifies for Shingles Vaccine? Yes  Zostavax completed Yes   Shingrix Completed?: Yes  Screening Tests Health Maintenance  Topic Date Due   COVID-19 Vaccine (4 - 2023-24 season) 10/01/2022 (Originally 12/31/2021)   Pneumonia Vaccine 48+ Years old (2 of 2 - PPSV23 or PCV20) 09/15/2023 (Originally 11/06/2014)   INFLUENZA VACCINE  12/01/2022   Medicare Annual Wellness (AWV)  09/15/2023   DTaP/Tdap/Td (2 - Td or Tdap) 10/29/2031   Zoster Vaccines- Shingrix  Completed   HPV VACCINES  Aged Out    Health Maintenance  There are no preventive care reminders to display for this patient.   Colorectal cancer screening: No longer required.   Lung Cancer Screening: (Low Dose CT Chest recommended if Age 76-80 years, 30 pack-year currently smoking OR have quit w/in  15years.) does not qualify.    Additional Screening:  Hepatitis C Screening: does not qualify; Completed   Vision Screening: Recommended annual ophthalmology exams for early detection of glaucoma and other disorders of the eye. Is the patient up to date with their annual eye exam?  Yes  Who is the provider or what is the name of the office in which the patient attends annual eye exams? [facility] If pt is not established with a provider, would they like to be referred to a provider to establish care? No .   Dental Screening: Recommended annual dental exams for proper oral hygiene  Community Resource Referral / Chronic Care Management:  CRR required this visit?  No   CCM required this visit?  No      Plan:     I have personally reviewed and noted the following in the patient's chart:   Medical and social history Use of alcohol, tobacco or illicit drugs  Current medications and supplements including opioid prescriptions. Patient is not currently taking opioid prescriptions. Functional ability and status Nutritional status Physical activity Advanced directives List of other physicians Hospitalizations, surgeries, and ER  visits in previous 12 months Vitals Screenings to include cognitive, depression, and falls Referrals and appointments  In addition, I have reviewed and discussed with patient certain preventive protocols, quality metrics, and best practice recommendations. A written personalized care plan for preventive services as well as general preventive health recommendations were provided to patient.     Tillie Rung, LPN   1/61/0960   Nurse Notes: None

## 2022-09-16 NOTE — Assessment & Plan Note (Signed)
Stable.  Continue current medication

## 2022-09-16 NOTE — Assessment & Plan Note (Signed)
Stable.  -continue all inhalers and respiratory medications as prescribed

## 2022-09-22 ENCOUNTER — Encounter: Payer: Self-pay | Admitting: Family Medicine

## 2022-09-22 ENCOUNTER — Ambulatory Visit (INDEPENDENT_AMBULATORY_CARE_PROVIDER_SITE_OTHER): Payer: Medicare HMO | Admitting: Family Medicine

## 2022-09-22 VITALS — BP 159/75 | HR 51 | Resp 18 | Ht 67.5 in | Wt 142.0 lb

## 2022-09-22 DIAGNOSIS — K4090 Unilateral inguinal hernia, without obstruction or gangrene, not specified as recurrent: Secondary | ICD-10-CM | POA: Diagnosis not present

## 2022-09-22 MED ORDER — HERNIA BELT DOUBLE LARGE MISC
0 refills | Status: AC
Start: 2022-09-22 — End: ?

## 2022-09-22 NOTE — Progress Notes (Signed)
   Acute Office Visit  Subjective:     Patient ID: [name], male    DOB: 11/19/1930, 89 y.o.   MRN: [id number]  Chief Complaint  Patient presents with   Hernia    HPI Patient is in today for hernia.  Patient was showering this morning and noticed a soft bulge in his right inguinal area for the first time.  It is painless and reducible.  It does not have any abnormal color.  He states that this has never happened before.  He is not constipated and does not strain with bowel movements, but he was riding a tractor over rough terrain yesterday and wonders if the jostling may have caused this issue.  Denies abdominal pain, constipation/diarrhea, fever, chills, urinary symptoms.  ROS Negative unless otherwise noted in HPI.     Objective:    BP (!) 172/80 (BP Location: Left Arm, Patient Position: Sitting, Cuff Size: Normal)   Pulse (!) 44   Resp 18   Ht 5' 7.5" (1.715 m)   Wt 142 lb (64.4 kg)   SpO2 96%   BMI 21.91 kg/m   Physical Exam Vitals reviewed.  Constitutional:      General: He is not in acute distress.    Appearance: Normal appearance. He is normal weight. He is not ill-appearing or diaphoretic.  HENT:     Head: Normocephalic and atraumatic.     Nose: Nose normal.  Eyes:     Conjunctiva/sclera: Conjunctivae normal.  Pulmonary:     Effort: Pulmonary effort is normal.  Abdominal:     Palpations: Abdomen is soft.     Tenderness: There is no abdominal tenderness.     Hernia: A hernia is present. Hernia is present in the right inguinal area.  Musculoskeletal:     Cervical back: Normal range of motion.  Skin:    Coloration: Skin is not jaundiced or pale.     Findings: No bruising.  Neurological:     Mental Status: He is alert and oriented to person, place, and time.  Psychiatric:        Mood and Affect: Mood normal.      Assessment & Plan:  Right inguinal hernia Assessment & Plan: Physical exam consistent with right inguinal hernia.  Exam reassuring:  Nontender, reducible, no signs of strangulation.  We discussed conservative management including preventing constipation, avoiding lifting heavy loads, and providing extra support if he does need to ride the tractor.  Recommended hernia belt before he is able to see general surgery.  Patient did express concern and would be reassured if he could see general surgery to discuss whether or not surgery is necessary.  We also discussed warning signs to seek immediate help including fever, hernia pain, nonreducible, changes in color.  Patient verbalized understanding and is agreeable with this plan.  Orders: -     Hernia Belt Double Large; Wear hernia belt for additional support, especially while riding the tractor.  Dispense: 1 each; Refill: 0 -     Ambulatory referral to General Surgery    Return if symptoms worsen or fail to improve.  Melida Quitter, PA

## 2022-09-22 NOTE — Patient Instructions (Signed)
There are hernia belts that you can purchase for additional support.  I have sent in 1 to your pharmacy, but you may have to pay out-of-pocket unless you are pharmacy benefits include over-the-counter prescriptions.  You will need to call your insurance company to ask them.  Otherwise, you can find a hernia belt at a local pharmacy or on [employer].

## 2022-09-22 NOTE — Assessment & Plan Note (Signed)
Physical exam consistent with right inguinal hernia.  Exam reassuring: Nontender, reducible, no signs of strangulation.  We discussed conservative management including preventing constipation, avoiding lifting heavy loads, and providing extra support if he does need to ride the tractor.  Recommended hernia belt before he is able to see general surgery.  Patient did express concern and would be reassured if he could see general surgery to discuss whether or not surgery is necessary.  We also discussed warning signs to seek immediate help including fever, hernia pain, nonreducible, changes in color.  Patient verbalized understanding and is agreeable with this plan.

## 2022-09-29 NOTE — Addendum Note (Signed)
Addended by: Vincent Gros on: 09/29/2022 08:41 AM   Modules accepted: Level of Service

## 2022-10-25 DIAGNOSIS — H524 Presbyopia: Secondary | ICD-10-CM | POA: Diagnosis not present

## 2022-11-14 ENCOUNTER — Other Ambulatory Visit: Payer: Self-pay | Admitting: Internal Medicine

## 2022-11-14 DIAGNOSIS — J4540 Moderate persistent asthma, uncomplicated: Secondary | ICD-10-CM

## 2023-02-09 ENCOUNTER — Other Ambulatory Visit: Payer: Self-pay

## 2023-02-09 DIAGNOSIS — E782 Mixed hyperlipidemia: Secondary | ICD-10-CM

## 2023-02-09 DIAGNOSIS — Z0000 Encounter for general adult medical examination without abnormal findings: Secondary | ICD-10-CM

## 2023-02-09 DIAGNOSIS — I10 Essential (primary) hypertension: Secondary | ICD-10-CM

## 2023-02-18 ENCOUNTER — Other Ambulatory Visit: Payer: Self-pay | Admitting: Nurse Practitioner

## 2023-02-18 DIAGNOSIS — I10 Essential (primary) hypertension: Secondary | ICD-10-CM

## 2023-02-20 ENCOUNTER — Other Ambulatory Visit: Payer: Medicare HMO

## 2023-02-20 DIAGNOSIS — I10 Essential (primary) hypertension: Secondary | ICD-10-CM

## 2023-02-20 DIAGNOSIS — Z0000 Encounter for general adult medical examination without abnormal findings: Secondary | ICD-10-CM

## 2023-02-20 DIAGNOSIS — E782 Mixed hyperlipidemia: Secondary | ICD-10-CM

## 2023-02-21 LAB — LIPID PANEL
Chol/HDL Ratio: 2.9 ratio (ref 0.0–5.0)
Cholesterol, Total: 159 mg/dL (ref 100–199)
HDL: 54 mg/dL (ref >39)
LDL Chol Calc (NIH): 84 mg/dL (ref 0–99)
Triglycerides: 117 mg/dL (ref 0–149)
VLDL Cholesterol Cal: 21 mg/dL (ref 5–40)

## 2023-02-21 LAB — COMPREHENSIVE METABOLIC PANEL
ALT: 26 [IU]/L (ref 0–44)
AST: 23 [IU]/L (ref 0–40)
Albumin: 4 g/dL (ref 3.7–4.7)
Alkaline Phosphatase: 67 [IU]/L (ref 44–121)
BUN/Creatinine Ratio: 13 (ref 10–24)
BUN: 10 mg/dL (ref 8–27)
Bilirubin Total: 0.6 mg/dL (ref 0.0–1.2)
CO2: 25 mmol/L (ref 20–29)
Calcium: 9.5 mg/dL (ref 8.6–10.2)
Chloride: 97 mmol/L (ref 96–106)
Creatinine, Ser: 0.77 mg/dL (ref 0.76–1.27)
Globulin, Total: 2.7 g/dL (ref 1.5–4.5)
Glucose: 94 mg/dL (ref 70–99)
Potassium: 4.1 mmol/L (ref 3.5–5.2)
Sodium: 138 mmol/L (ref 134–144)
Total Protein: 6.7 g/dL (ref 6.0–8.5)
eGFR: 86 mL/min/{1.73_m2} (ref >59)

## 2023-02-21 LAB — CBC WITH DIFFERENTIAL/PLATELET
Basophils Absolute: 0 10*3/uL (ref 0.0–0.2)
Basos: 1 %
EOS (ABSOLUTE): 0.5 10*3/uL — ABNORMAL HIGH (ref 0.0–0.4)
Eos: 10 %
Hematocrit: 41.1 % (ref 37.5–51.0)
Hemoglobin: 14.1 g/dL (ref 13.0–17.7)
Immature Grans (Abs): 0 10*3/uL (ref 0.0–0.1)
Immature Granulocytes: 0 %
Lymphocytes Absolute: 1.1 10*3/uL (ref 0.7–3.1)
Lymphs: 21 %
MCH: 32 pg (ref 26.6–33.0)
MCHC: 34.3 g/dL (ref 31.5–35.7)
MCV: 93 fL (ref 79–97)
Monocytes Absolute: 0.6 10*3/uL (ref 0.1–0.9)
Monocytes: 12 %
Neutrophils Absolute: 3 10*3/uL (ref 1.4–7.0)
Neutrophils: 56 %
Platelets: 198 10*3/uL (ref 150–450)
RBC: 4.4 x10E6/uL (ref 4.14–5.80)
RDW: 11.7 % (ref 11.6–15.4)
WBC: 5.2 10*3/uL (ref 3.4–10.8)

## 2023-02-21 LAB — HEMOGLOBIN A1C
Est. average glucose Bld gHb Est-mCnc: 117 mg/dL
Hgb A1c MFr Bld: 5.7 % — ABNORMAL HIGH (ref 4.8–5.6)

## 2023-02-21 LAB — TSH: TSH: 1.11 u[IU]/mL (ref 0.450–4.500)

## 2023-02-24 ENCOUNTER — Other Ambulatory Visit: Payer: Self-pay | Admitting: Nurse Practitioner

## 2023-02-24 DIAGNOSIS — I10 Essential (primary) hypertension: Secondary | ICD-10-CM

## 2023-02-24 NOTE — Telephone Encounter (Signed)
Hi. Another one for you!

## 2023-02-27 ENCOUNTER — Encounter: Payer: Self-pay | Admitting: Family Medicine

## 2023-02-27 ENCOUNTER — Ambulatory Visit (INDEPENDENT_AMBULATORY_CARE_PROVIDER_SITE_OTHER): Payer: Medicare HMO | Admitting: Family Medicine

## 2023-02-27 VITALS — BP 130/67 | HR 58 | Resp 18 | Ht 67.5 in | Wt 144.0 lb

## 2023-02-27 DIAGNOSIS — I700 Atherosclerosis of aorta: Secondary | ICD-10-CM

## 2023-02-27 DIAGNOSIS — I10 Essential (primary) hypertension: Secondary | ICD-10-CM | POA: Diagnosis not present

## 2023-02-27 DIAGNOSIS — Z23 Encounter for immunization: Secondary | ICD-10-CM | POA: Diagnosis not present

## 2023-02-27 MED ORDER — VALSARTAN-HYDROCHLOROTHIAZIDE 320-25 MG PO TABS: 1.0000 | ORAL_TABLET | Freq: Every day | ORAL | 1 refills | Status: DC

## 2023-02-27 NOTE — Assessment & Plan Note (Signed)
Initially noted on CXR in April 2019, it does not appear that there was any follow-up since that time.  Given lack of follow-up in addition to uncontrolled hypertension, ordering echocardiogram.

## 2023-02-27 NOTE — Patient Instructions (Addendum)
After we increase your blood pressure medicine, you will come back for a nurse visit to check your blood pressure again and make sure that it is decreasing.    You should be getting a call to schedule your echocardiogram within the next couple of weeks.

## 2023-02-27 NOTE — Progress Notes (Signed)
Established Patient Office Visit  Subjective   Patient ID: [name], male    DOB: 19-Dec-1930  Age: 89 y.o. MRN: [id number]  Chief Complaint  Patient presents with   Hypertension    HPI [name] is a 89 y.o. male presenting today for follow up of hypertension. Hypertension:   Pt denies chest pain, SOB, dizziness, edema, syncope, fatigue or heart palpitations. Taking amlodipine and valsartan-hydrochlorothiazide daily each morning, reports excellent compliance with treatment. Denies side effects.  He has been checking his blood pressure at home and states that it is typically high.  He has not been keeping a log of the numbers that he gets at home.   Outpatient Medications Prior to Visit  Medication Sig   acitretin (SORIATANE) 25 MG capsule Take 25 mg by mouth daily.   albuterol (PROAIR HFA) 108 (90 Base) MCG/ACT inhaler 2 puffs every 4 hours as needed only  if your can't catch your breath (Patient taking differently: Inhale 2 puffs into the lungs every 6 (six) hours as needed for wheezing or shortness of breath.)   amLODipine (NORVASC) 5 MG tablet TAKE 1 TABLET (5 MG TOTAL) BY MOUTH DAILY.   budesonide-formoterol (SYMBICORT) 160-4.5 MCG/ACT inhaler Take 2 puffs first thing in am and then another 2 puffs about 12 hours later.   Elastic Bandages & Supports (HERNIA BELT DOUBLE LARGE) MISC Wear hernia belt for additional support, especially while riding the tractor.   HYDROcodone-acetaminophen (NORCO) 5-325 MG tablet Take 0.5-1 tablets by mouth every 8 (eight) hours as needed for moderate pain.   latanoprost (XALATAN) 0.005 % ophthalmic solution Place 1 drop into both eyes at bedtime.   methocarbamol (ROBAXIN) 500 MG tablet Take 1 tablet (500 mg total) by mouth every 8 (eight) hours as needed for muscle spasms.   ondansetron (ZOFRAN) 4 MG tablet Take 1 tablet (4 mg total) by mouth every 6 (six) hours as needed for nausea.   valsartan-hydrochlorothiazide (DIOVAN-HCT) 320-12.5 MG  tablet Take 1 tablet by mouth daily.   [DISCONTINUED] valsartan-hydrochlorothiazide (DIOVAN-HCT) 320-12.5 MG tablet TAKE 1 TABLET BY MOUTH EVERY DAY   No facility-administered medications prior to visit.    ROS Negative unless otherwise noted in HPI   Objective:     BP 130/67 (BP Location: Left Arm, Patient Position: Sitting, Cuff Size: Normal)   Pulse (!) 58   Resp 18   Ht 5' 7.5" (1.715 m)   Wt 144 lb (65.3 kg)   SpO2 94%   BMI 22.22 kg/m   Physical Exam Constitutional:      General: He is not in acute distress.    Appearance: Normal appearance.  HENT:     Head: Normocephalic and atraumatic.  Cardiovascular:     Rate and Rhythm: Normal rate and regular rhythm.     Heart sounds: Normal heart sounds. No murmur heard.    No friction rub. No gallop.  Pulmonary:     Effort: Pulmonary effort is normal. No respiratory distress.     Breath sounds: Normal breath sounds. No wheezing, rhonchi or rales.  Skin:    General: Skin is warm and dry.  Neurological:     Mental Status: He is alert and oriented to person, place, and time.  Psychiatric:        Mood and Affect: Mood normal.     Assessment & Plan:  Essential hypertension Assessment & Plan: BP goal <130/80.  Above goal initially and as reported at home, but at goal and blood pressure was  repeated in the office.  Continue amlodipine 5 mg daily, valsartan-hydrochlorothiazide 320-12.5 mg daily, keep blood pressure log for the next 2 weeks and return for nurse visit blood pressure check at that time.  Most recent CMP.  Will continue to monitor.   Aortic atherosclerosis [facility]) Assessment & Plan: Initially noted on CXR in April 2019, it does not appear that there was any follow-up since that time.  Given lack of follow-up in addition to uncontrolled hypertension, ordering echocardiogram.  Orders: -     ECHOCARDIOGRAM COMPLETE; Future  Need for pneumococcal 20-valent conjugate vaccination -     Pneumococcal conjugate vaccine  20-valent    Return in about 2 weeks (around 03/13/2023) for BP check nurse visit; 1 month for HTN, echocardiogram follow up.   Melida Quitter, PA

## 2023-02-27 NOTE — Assessment & Plan Note (Addendum)
BP goal <130/80.  Above goal initially and as reported at home, but at goal and blood pressure was repeated in the office.  Continue amlodipine 5 mg daily, valsartan-hydrochlorothiazide 320-12.5 mg daily, keep blood pressure log for the next 2 weeks and return for nurse visit blood pressure check at that time.  Most recent CMP.  Will continue to monitor.

## 2023-03-10 NOTE — Addendum Note (Signed)
Addended by: Lamonte Sakai, Ousmane Seeman D on: 03/10/2023 01:25 PM   Modules accepted: Orders

## 2023-03-13 ENCOUNTER — Ambulatory Visit: Payer: Medicare HMO

## 2023-03-16 ENCOUNTER — Ambulatory Visit: Payer: Medicare HMO | Attending: Internal Medicine

## 2023-03-16 DIAGNOSIS — I700 Atherosclerosis of aorta: Secondary | ICD-10-CM | POA: Diagnosis not present

## 2023-03-17 LAB — ECHOCARDIOGRAM COMPLETE
AR max vel: 3.27 cm2
AV Area VTI: 3.6 cm2
AV Area mean vel: 3.23 cm2
AV Mean grad: 2 mm[Hg]
AV Peak grad: 4.7 mm[Hg]
AV Vena cont: 0.5 cm
Ao pk vel: 1.08 m/s
Area-P 1/2: 3.19 cm2
Calc EF: 55 %
S' Lateral: 2.3 cm
Single Plane A2C EF: 56.4 %
Single Plane A4C EF: 55.3 %

## 2023-04-12 ENCOUNTER — Encounter: Payer: Self-pay | Admitting: Family Medicine

## 2023-04-12 ENCOUNTER — Ambulatory Visit (INDEPENDENT_AMBULATORY_CARE_PROVIDER_SITE_OTHER): Payer: Medicare HMO | Admitting: Family Medicine

## 2023-04-12 VITALS — BP 108/68 | HR 79 | Resp 18 | Ht 67.5 in | Wt 138.0 lb

## 2023-04-12 DIAGNOSIS — E7800 Pure hypercholesterolemia, unspecified: Secondary | ICD-10-CM

## 2023-04-12 DIAGNOSIS — I10 Essential (primary) hypertension: Secondary | ICD-10-CM | POA: Diagnosis not present

## 2023-04-12 NOTE — Assessment & Plan Note (Signed)
BP goal <130/80.  Stable, at goal.  Continue amlodipine 5 mg daily, valsartan-hydrochlorothiazide 320-12.5 mg daily.  Most recent CMP within normal limits.  Will continue to monitor.

## 2023-04-12 NOTE — Progress Notes (Signed)
Established Patient Office Visit  Subjective   Patient ID: [name], male    DOB: March 02, 1931  Age: 89 y.o. MRN: [id number]  Chief Complaint  Patient presents with   Hypertension    HPI [name] is a 89 y.o. male presenting today for follow up of hypertension and to discuss echocardiogram results.  Echocardiogram was ordered to follow-up on aortic atherosclerosis initially noted on CXR April 2019.  He notes that he has cut back on alcohol consumption which has improved his blood pressure at home, he has been getting similar values to his blood pressure in clinic today.  Outpatient Medications Prior to Visit  Medication Sig   acitretin (SORIATANE) 25 MG capsule Take 25 mg by mouth daily.   albuterol (PROAIR HFA) 108 (90 Base) MCG/ACT inhaler 2 puffs every 4 hours as needed only  if your can't catch your breath (Patient taking differently: Inhale 2 puffs into the lungs every 6 (six) hours as needed for wheezing or shortness of breath.)   amLODipine (NORVASC) 5 MG tablet TAKE 1 TABLET (5 MG TOTAL) BY MOUTH DAILY.   budesonide-formoterol (SYMBICORT) 160-4.5 MCG/ACT inhaler Take 2 puffs first thing in am and then another 2 puffs about 12 hours later.   Elastic Bandages & Supports (HERNIA BELT DOUBLE LARGE) MISC Wear hernia belt for additional support, especially while riding the tractor.   HYDROcodone-acetaminophen (NORCO) 5-325 MG tablet Take 0.5-1 tablets by mouth every 8 (eight) hours as needed for moderate pain.   latanoprost (XALATAN) 0.005 % ophthalmic solution Place 1 drop into both eyes at bedtime.   methocarbamol (ROBAXIN) 500 MG tablet Take 1 tablet (500 mg total) by mouth every 8 (eight) hours as needed for muscle spasms.   ondansetron (ZOFRAN) 4 MG tablet Take 1 tablet (4 mg total) by mouth every 6 (six) hours as needed for nausea.   valsartan-hydrochlorothiazide (DIOVAN-HCT) 320-12.5 MG tablet Take 1 tablet by mouth daily.   No facility-administered medications prior  to visit.    ROS Negative unless otherwise noted in HPI   Objective:     BP 108/68 (BP Location: Left Arm, Patient Position: Sitting, Cuff Size: Normal)   Pulse 79   Resp 18   Ht 5' 7.5" (1.715 m)   Wt 138 lb (62.6 kg)   SpO2 96%   BMI 21.29 kg/m   Physical Exam Constitutional:      General: He is not in acute distress.    Appearance: Normal appearance.  HENT:     Head: Normocephalic and atraumatic.  Cardiovascular:     Rate and Rhythm: Normal rate and regular rhythm.     Heart sounds: Normal heart sounds. No murmur (No discernible murmur) heard.    No friction rub. No gallop.  Pulmonary:     Effort: Pulmonary effort is normal. No respiratory distress.     Breath sounds: Normal breath sounds. No wheezing, rhonchi or rales.  Skin:    General: Skin is warm and dry.  Neurological:     Mental Status: He is alert and oriented to person, place, and time.  Psychiatric:        Mood and Affect: Mood normal.     Assessment & Plan:  Essential hypertension Assessment & Plan: BP goal <130/80.  Stable, at goal.  Continue amlodipine 5 mg daily, valsartan-hydrochlorothiazide 320-12.5 mg daily.  Most recent CMP within normal limits.  Will continue to monitor.   Ejection fraction 60-65%, normal LV function but grade 1 diastolic dysfunction.  Mildly  dilated left atrium mild mitral valve regurg, mild to moderate tricuspid valve regurg, mild aortic valve regurg.  There is no aortic stenosis, no noted aortic atherosclerosis on echocardiogram.  Return in about 5 months (around 09/10/2023) for follow-up for HTN, HLD, fasting blood work 1 week before.    Melida Quitter, PA

## 2023-04-12 NOTE — Patient Instructions (Signed)
ECHOCARDIOGRAM: Most recent echocardiogram shows that your heart is still pumping out blood at the same efficiency as your last echocardiogram.  One of the chambers of your heart called the left atrium is mildly dilated which is new from your last echocardiogram.  To prevent this from worsening, it is essential that we keep your blood pressure consistently at the goal of less than 130 on the top number and less than 80 on the bottom number.  Please be checking your blood pressure at home and keep a log so that we know if we need to make any changes to your current routine.

## 2023-05-05 ENCOUNTER — Inpatient Hospital Stay: Payer: Medicare HMO

## 2023-05-05 ENCOUNTER — Ambulatory Visit: Payer: Medicare HMO | Admitting: Hematology & Oncology

## 2023-06-08 ENCOUNTER — Encounter: Payer: Self-pay | Admitting: Family Medicine

## 2023-06-09 ENCOUNTER — Encounter: Payer: Self-pay | Admitting: Family Medicine

## 2023-08-16 ENCOUNTER — Other Ambulatory Visit: Payer: Self-pay | Admitting: Family Medicine

## 2023-08-16 DIAGNOSIS — I10 Essential (primary) hypertension: Secondary | ICD-10-CM

## 2023-08-17 NOTE — Telephone Encounter (Signed)
 LVM for pt to call office to schedule appointment following his lab visit on 5/5

## 2023-08-19 ENCOUNTER — Other Ambulatory Visit: Payer: Self-pay | Admitting: Family Medicine

## 2023-08-19 DIAGNOSIS — I10 Essential (primary) hypertension: Secondary | ICD-10-CM

## 2023-08-21 ENCOUNTER — Telehealth: Payer: Self-pay | Admitting: *Deleted

## 2023-08-21 NOTE — Telephone Encounter (Signed)
 LVM for pt to call office to see about scheduling a office visit to follow up after labs, originally it was scheduled on 5/12 with Britta Candy but since she is gone please schedule this with Dr. Arabella Beach.

## 2023-09-04 ENCOUNTER — Other Ambulatory Visit: Payer: Medicare HMO

## 2023-09-11 ENCOUNTER — Ambulatory Visit: Payer: Medicare HMO | Admitting: Family Medicine

## 2023-09-13 ENCOUNTER — Other Ambulatory Visit

## 2023-09-13 DIAGNOSIS — E7800 Pure hypercholesterolemia, unspecified: Secondary | ICD-10-CM | POA: Diagnosis not present

## 2023-09-13 DIAGNOSIS — I10 Essential (primary) hypertension: Secondary | ICD-10-CM

## 2023-09-13 NOTE — Progress Notes (Unsigned)
 Subjective:     Patient ID: [name], male   DOB: 1931/03/06    MRN: [id number]    Brief patient profile:  58 yowm never smoker with hx suggestive of longstanding asthma and  h/o cough attributed to to gerd and cc  variable sob/wheeze  for which inhalers seem  to help referred to pulmonary clinic 09/14/2017 by Dr [name] with worse noct wheeze ? since starting ACEi and refractory to symb  160 dosed at 2bid though baseline hfa technique suboptimal.     History of Present Illness  09/14/2017 1st [name] office visit/ [name]   Chief Complaint  Patient presents with   Consult    SOB with excertion. wheezing at night.   doe x decades worse with fence post splitting and rapid walking assoc with noct wheeze while on ACEi and better with  use of Symbicort  esp at bedtime but also used with "wheezing"  Am of ov (w/in 12 hours of last symb 160  dose)  rec Symbicort  160 can be used up to 2 puffs every 12 hours Work on inhaler technique:   Stop lisinopril -hctz and start diovan  160-25 mg daily - call if problems  Please schedule a follow up office visit in 6 weeks, call sooner if needed with all medications /inhalers/ solutions in hand so we can verify exactly what you are taking. This includes all medications from all doctors and over the counters  - PFTs on return     03/16/2021  f/u ov/[name] re: mod asthma  maint on symbicort  160 2bid   Chief Complaint  Patient presents with   Follow-up    Sob with exertion, at baseline.  Rec Plan A = Automatic = Always=   Symbicort  160 Take 2 puffs first thing in am and then another 2 puffs about 12 hours later  Work on inhaler technique   Plan B = Backup (to supplement plan A, not to replace it) Only use your albuterol  inhaler as a rescue medication  Ok to try albuterol  15 min before an activity (on alternating days)  that you know would usually make you short of breath       09/14/2022  21m f/u ov/[name] re: asthma  maint on symbicort  160 did  not bring inhalers  Chief Complaint  Patient presents with   Follow-up    Doing well.  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   Dyspnea:  very active farming can tell if not taking daytime symbicort   Cough: no  Sleeping: 30 degrees HOB s resp cc normally, some wheezing when not taking the am symbicort   SABA use: rarely  02: none     No obvious day to day or daytime variability or assoc excess/ purulent sputum or mucus plugs or hemoptysis or cp or chest tightness,  overt sinus or hb symptoms.    Also denies any obvious fluctuation of symptoms with weather or environmental changes or other aggravating or alleviating factors except as outlined above   No unusual exposure hx or h/o childhood pna/ asthma or knowledge of premature birth.  Current Allergies,  Complete Past Medical History, Past Surgical History, Family History, and Social History were reviewed in [employer] record.  ROS  The following are not active complaints unless bolded Hoarseness, sore throat, dysphagia, dental problems, itching, sneezing,  nasal congestion or discharge of excess mucus or purulent secretions, ear ache,   fever, chills, sweats, unintended wt loss or wt gain, classically pleuritic or exertional cp,  orthopnea pnd or arm/hand swelling  or leg swelling, presyncope, palpitations, abdominal pain, anorexia, nausea, vomiting, diarrhea  or change in bowel habits or change in bladder habits, change in stools or change in urine, dysuria, hematuria,  rash, arthralgias, visual complaints, headache, numbness, weakness or ataxia or problems with walking or coordination,  change in mood or   memory.        Current Meds  Medication Sig   albuterol  (PROAIR  HFA) 108 (90 Base) MCG/ACT inhaler 2 puffs every 4 hours as needed only  if your can't catch your breath (Patient taking differently: Inhale 2 puffs into the lungs every 6 (six) hours as needed for wheezing or shortness of breath.)   amLODipine  (NORVASC ) 5 MG tablet TAKE 1 TABLET (5 MG TOTAL) BY MOUTH DAILY.   budesonide -formoterol  (SYMBICORT ) 160-4.5 MCG/ACT inhaler Take 2 puffs first thing in am and then another 2 puffs about 12 hours later.   Elastic Bandages & Supports (HERNIA BELT DOUBLE LARGE) MISC Wear hernia belt for additional support, especially while riding the tractor.   latanoprost  (XALATAN ) 0.005 % ophthalmic solution Place 1 drop into both eyes at bedtime.               Objective:   Physical Exam   Wts  09/14/2023        133 09/14/2022        139 03/16/2022      139  03/16/2021      138   02/28/2020     146  05/07/2018          152 02/01/2018        148   10/30/2017         143   09/14/17 145 lb 9.6 oz (66 kg)  05/04/17 139 lb 8 oz (63.3 kg)  05/05/16 144 lb (65.3 kg)      Vital signs reviewed  09/14/2023  - Note at rest 02 sats  97% on RA   General appearance:    amb elderly wm nad     HEENT : Oropharynx  clear   Nasal turbinates nl    NECK :  without  apparent JVD/ palpable Nodes/TM    LUNGS: no acc muscle use,  Min barrel  contour chest wall with bilateral  slightly decreased bs s audible wheeze and  without cough on insp or exp maneuvers and min  Hyperresonant  to  percussion bilaterally    CV:  RRR  no s3 or murmur or increase in P2, and no edema   ABD:  soft and nontender   MS:  Nl gait/ ext warm without deformities Or obvious joint restrictions  calf tenderness, cyanosis or clubbing     SKIN: warm and dry without lesions    NEURO:  alert, approp, nl sensorium with  no motor or cerebellar deficits apparent.                      Assessment:

## 2023-09-14 ENCOUNTER — Encounter: Payer: Self-pay | Admitting: Internal Medicine

## 2023-09-14 ENCOUNTER — Ambulatory Visit: Admitting: Internal Medicine

## 2023-09-14 VITALS — BP 150/72 | HR 47 | Ht 68.0 in | Wt 133.8 lb

## 2023-09-14 DIAGNOSIS — J45909 Unspecified asthma, uncomplicated: Secondary | ICD-10-CM

## 2023-09-14 DIAGNOSIS — J4540 Moderate persistent asthma, uncomplicated: Secondary | ICD-10-CM

## 2023-09-14 LAB — CBC WITH DIFFERENTIAL/PLATELET
Basophils Absolute: 0 10*3/uL (ref 0.0–0.2)
Basos: 1 %
EOS (ABSOLUTE): 0.4 10*3/uL (ref 0.0–0.4)
Eos: 8 %
Hematocrit: 44.1 % (ref 37.5–51.0)
Hemoglobin: 14.8 g/dL (ref 13.0–17.7)
Immature Grans (Abs): 0 10*3/uL (ref 0.0–0.1)
Immature Granulocytes: 0 %
Lymphocytes Absolute: 1 10*3/uL (ref 0.7–3.1)
Lymphs: 20 %
MCH: 31.2 pg (ref 26.6–33.0)
MCHC: 33.6 g/dL (ref 31.5–35.7)
MCV: 93 fL (ref 79–97)
Monocytes Absolute: 0.5 10*3/uL (ref 0.1–0.9)
Monocytes: 9 %
Neutrophils Absolute: 3.2 10*3/uL (ref 1.4–7.0)
Neutrophils: 62 %
Platelets: 183 10*3/uL (ref 150–450)
RBC: 4.75 x10E6/uL (ref 4.14–5.80)
RDW: 12.8 % (ref 11.6–15.4)
WBC: 5.1 10*3/uL (ref 3.4–10.8)

## 2023-09-14 LAB — COMPREHENSIVE METABOLIC PANEL WITH GFR
ALT: 19 IU/L (ref 0–44)
AST: 23 IU/L (ref 0–40)
Albumin: 4.3 g/dL (ref 3.6–4.6)
Alkaline Phosphatase: 85 IU/L (ref 44–121)
BUN/Creatinine Ratio: 12 (ref 10–24)
BUN: 13 mg/dL (ref 10–36)
Bilirubin Total: 0.7 mg/dL (ref 0.0–1.2)
CO2: 18 mmol/L — ABNORMAL LOW (ref 20–29)
Calcium: 9.8 mg/dL (ref 8.6–10.2)
Chloride: 99 mmol/L (ref 96–106)
Creatinine, Ser: 1.08 mg/dL (ref 0.76–1.27)
Globulin, Total: 2.8 g/dL (ref 1.5–4.5)
Glucose: 90 mg/dL (ref 70–99)
Potassium: 4.8 mmol/L (ref 3.5–5.2)
Sodium: 138 mmol/L (ref 134–144)
Total Protein: 7.1 g/dL (ref 6.0–8.5)
eGFR: 65 mL/min/{1.73_m2} (ref >59)

## 2023-09-14 LAB — LIPID PANEL
Chol/HDL Ratio: 3.6 ratio (ref 0.0–5.0)
Cholesterol, Total: 182 mg/dL (ref 100–199)
HDL: 50 mg/dL (ref >39)
LDL Chol Calc (NIH): 110 mg/dL — ABNORMAL HIGH (ref 0–99)
Triglycerides: 125 mg/dL (ref 0–149)
VLDL Cholesterol Cal: 22 mg/dL (ref 5–40)

## 2023-09-14 MED ORDER — BUDESONIDE-FORMOTEROL FUMARATE 160-4.5 MCG/ACT IN AERO: INHALATION_SPRAY | RESPIRATORY_TRACT | 3 refills | Status: DC

## 2023-09-14 NOTE — Assessment & Plan Note (Signed)
 Never smoker PFT's  10/06/09   FEV1 2.56 (100 % ) ratio 51  p no % improvement from saba p ? prior to study with DLCO  116%  - PFT's  10/30/2017  FEV1 1.80 (39 % ) ratio 55   p 39 % improvement from saba p nothing prior to study with DLCO  72 % corrects to 89 % for alv volume   - 10/30/2017  symbicort  160   2 bid - 02/01/2018  After extensive coaching inhaler device,  effectiveness =    75% from baseline 25% (very late insp p trigger, short Ti) - Spirometry 02/01/2018  FEV1 1.3 (59%)  Ratio 56 p am symb 160 with poor hfa   - Allergy  profile 02/01/2018 >  Eos 0.3/  IgE  35 RAST neg  - Alpha one AT rec 05/07/2018  > declined  - 02/28/2020  After extensive coaching inhaler device,  effectiveness =    50% (delayed trigger, good Ti)  > rx spacer  - 03/16/2021  After extensive coaching inhaler device,  effectiveness =  80% (short ti)   - Spirometry 03/16/2022  FEV1 1.38 (59%)  Ratio 0.67 with min curvature p am symb 80 - 09/14/2022 attempted FENO could not do    All goals of chronic asthma control met including optimal function and elimination of symptoms with minimal need for rescue therapy on symbicort  160  x 2 each am only   Contingencies discussed in full including contacting this office immediately if not controlling the symptoms using the rule of two's.     F/u yearly, sooner if needed      Each maintenance medication was reviewed in detail including emphasizing most importantly the difference between maintenance and prns and under what circumstances the prns are to be triggered using an action plan format where appropriate.  Total time for H and P, chart review, counseling, reviewing hfa  device(s) and generating customized AVS unique to this office visit / same day charting = 20 min

## 2023-09-14 NOTE — Patient Instructions (Signed)
No change in medications   Please schedule a follow up visit in 12  months but call sooner if needed  

## 2023-09-15 ENCOUNTER — Ambulatory Visit: Payer: Self-pay | Admitting: Family Medicine

## 2023-09-20 ENCOUNTER — Ambulatory Visit (INDEPENDENT_AMBULATORY_CARE_PROVIDER_SITE_OTHER): Admitting: Family Medicine

## 2023-09-20 ENCOUNTER — Encounter: Payer: Self-pay | Admitting: Family Medicine

## 2023-09-20 VITALS — BP 180/89 | HR 51 | Ht 68.0 in | Wt 133.1 lb

## 2023-09-20 DIAGNOSIS — J4540 Moderate persistent asthma, uncomplicated: Secondary | ICD-10-CM | POA: Diagnosis not present

## 2023-09-20 DIAGNOSIS — I10 Essential (primary) hypertension: Secondary | ICD-10-CM

## 2023-09-20 DIAGNOSIS — K4090 Unilateral inguinal hernia, without obstruction or gangrene, not specified as recurrent: Secondary | ICD-10-CM

## 2023-09-20 MED ORDER — AMLODIPINE BESYLATE 10 MG PO TABS: 10.0000 mg | ORAL_TABLET | Freq: Every day | ORAL | 1 refills | Status: DC

## 2023-09-20 NOTE — Assessment & Plan Note (Signed)
 Patient requested that we refill his Symbicort  going forward.  He has no complications with this.  Recent note from pulmonology recommended 1 year follow-up.  Will send in his refills when he requested.  Does not use his albuterol .  Has not needed it.

## 2023-09-20 NOTE — Assessment & Plan Note (Signed)
 Patient's blood pressure was elevated on initial and repeat.  150/90 on repeat.  Agreeable to increasing amlodipine .  Continue Diovan  HCTZ.

## 2023-09-20 NOTE — Assessment & Plan Note (Signed)
 Patient has right inguinal hernia.  He states that this is a new thing for him, previous note from last year states that he had similar concerns a year ago.  No evidence of incarceration or strangulation.  Discussed red flag signs with him.  Recommended hernia belt.  Provided picture and advised him where to find them.  He declined general surgery referral.

## 2023-09-20 NOTE — Progress Notes (Unsigned)
   Established Patient Office Visit  Subjective   Patient ID: [name], male    DOB: 24-Apr-1931  Age: 89 y.o. MRN: [id number]  Chief Complaint  Patient presents with   Medical Management of Chronic Issues    HPI Subjective - Hernia: Noticed in groin area, reducible when lying down, non-painful, no discoloration - No current symptoms related to asthma - Psoriasis: Using topical cream only  Medications: Amlodipine , Valsartan -HCTZ for hypertension taken in morning, Latanoprost  eye drops  PMH: Hypertension, Asthma, Psoriasis, History of hernia repair, History of head and neck cancer PSH: Prior hernia repair FH: Not discussed Social Hx: Lives alone, has niece and cousin in [place], drives independently, 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), 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, has neighbor who assists with farm work, history of cattle-related injury 3-4 years ago requiring 5-day hospitalization with rib fractures  ROS: Denies use of rescue inhaler, denies pain at hernia site, reports home BP readings "always up some" in 150s    The ASCVD Risk score (Arnett DK, et al., 2019) failed to calculate for the following reasons:   The 2019 ASCVD risk score is only valid for ages 60 to 71  Health Maintenance Due  Topic Date Due   COVID-19 Vaccine (5 - Moderna risk 2024-25 season) 07/17/2023   Medicare Annual Wellness (AWV)  09/15/2023      Objective:     BP (!) 180/89   Pulse (!) 51   Ht 5\' 8"  (1.727 m)   Wt 133 lb 1.3 oz (60.4 kg)   SpO2 98%   BMI 20.23 kg/m  {Vitals History (Optional):23777}  Physical Exam General: Alert, oriented CV: Rate regular Pulmonary pulm lungs clear bilaterally GU: Large inguinal hernia on the right side.  No tenderness, warmth, or discoloration.   No results found for any visits on 09/20/23.      Assessment & Plan:   Right inguinal hernia Assessment & Plan: Patient has right inguinal hernia.  He states that this is a new thing for  him, previous note from last year states that he had similar concerns a year ago.  No evidence of incarceration or strangulation.  Discussed red flag signs with him.  Recommended hernia belt.  Provided picture and advised him where to find them.  He declined general surgery referral.   Essential hypertension Assessment & Plan: Patient's blood pressure was elevated on initial and repeat.  150/90 on repeat.  Agreeable to increasing amlodipine .  Continue Diovan  HCTZ.   Chronic asthma, moderate persistent, uncomplicated Assessment & Plan: Patient requested that we refill his Symbicort  going forward.  He has no complications with this.  Recent note from pulmonology recommended 1 year follow-up.  Will send in his refills when he requested.  Does not use his albuterol .  Has not needed it.      Return in about 6 months (around 03/22/2024) for HTN.    Laneta Pintos, MD

## 2023-09-20 NOTE — Patient Instructions (Addendum)
 It was nice to see you today,  We addressed the following topics today: -For your hernia, I have enclosed a picture of a hernia belt that may help with your symptoms.  It is available over-the-counter at most pharmacies.  Is also available at medical supply stores and [employer].  No prescription needed. - For your blood pressure I am increasing your amlodipine  to 10 mg.  If you have any of the 5 mg tablets left you can take 2 of the 5 mg tablets until you get the new prescription.  When she get the new prescription only take one 10 mg tablet as well as your Diovan      Have a great day,  Etha Henle, MD

## 2023-10-26 DIAGNOSIS — H52223 Regular astigmatism, bilateral: Secondary | ICD-10-CM | POA: Diagnosis not present

## 2024-02-08 ENCOUNTER — Other Ambulatory Visit: Payer: Self-pay | Admitting: Family Medicine

## 2024-02-08 DIAGNOSIS — I10 Essential (primary) hypertension: Secondary | ICD-10-CM

## 2024-03-22 ENCOUNTER — Ambulatory Visit

## 2024-03-22 VITALS — BP 139/53 | HR 42 | Ht 68.0 in | Wt 130.2 lb

## 2024-03-22 DIAGNOSIS — J4540 Moderate persistent asthma, uncomplicated: Secondary | ICD-10-CM

## 2024-03-22 DIAGNOSIS — Z23 Encounter for immunization: Secondary | ICD-10-CM | POA: Diagnosis not present

## 2024-03-22 DIAGNOSIS — I10 Essential (primary) hypertension: Secondary | ICD-10-CM | POA: Diagnosis not present

## 2024-03-22 DIAGNOSIS — L989 Disorder of the skin and subcutaneous tissue, unspecified: Secondary | ICD-10-CM | POA: Diagnosis not present

## 2024-03-22 DIAGNOSIS — E7800 Pure hypercholesterolemia, unspecified: Secondary | ICD-10-CM

## 2024-03-22 DIAGNOSIS — R7303 Prediabetes: Secondary | ICD-10-CM | POA: Diagnosis not present

## 2024-03-22 MED ORDER — AMLODIPINE BESYLATE 10 MG PO TABS: 10.0000 mg | ORAL_TABLET | Freq: Every day | ORAL | 1 refills | Status: AC

## 2024-03-22 MED ORDER — VALSARTAN-HYDROCHLOROTHIAZIDE 320-25 MG PO TABS
1.0000 | ORAL_TABLET | Freq: Every day | ORAL | 3 refills | Status: AC
Start: 2024-03-22 — End: ?

## 2024-03-22 MED ORDER — BUDESONIDE-FORMOTEROL FUMARATE 160-4.5 MCG/ACT IN AERO: INHALATION_SPRAY | RESPIRATORY_TRACT | 3 refills | Status: AC

## 2024-03-22 NOTE — Patient Instructions (Signed)
 It was nice to see you today!  As we discussed in clinic:  - We are increasing your Valsartan -hydrochlorothiazide  from 320-12.5 mg to 320-25 mg. Please disregard your old bottle of medication and replace it with the new one I have sent it.  -We will call you with lab results  - Please monitor your blood pressure at home and bring the log with you to your next visit  If you have any problems before your next visit feel free to message me via MyChart (minor issues or questions) or call the office, otherwise you may reach out to schedule an office visit.  Thank you! Saddie Sacks, PA-C

## 2024-03-22 NOTE — Assessment & Plan Note (Signed)
 Follows with Dr. Darlean. Has plenty of albuterol  as he uses it very infrequently. Does need refill on Symbicort .

## 2024-03-22 NOTE — Assessment & Plan Note (Signed)
 Skin lesion with overlying crust resembling AK vs cutaneous horn noted to right ear (see media tab for photo). Cryotherapy performed today in clinic. Advised that lesion may turn red and be mildly painful for a few days following the procedure. Advised to follow up if lesion begins to bleed or does not heal or recurs. Discussed referral to dermatology for FBSE but patient declined.

## 2024-03-22 NOTE — Assessment & Plan Note (Signed)
 BP Goal <130/80. BP above goal in office today and on recheck. Agreeable to increase Diovan  to 325-25 mg. Continue Amlodipine  10 mg daily. Provided with BP log for ambulatory BP monitoring. Follow up in 4 weeks for HTN follow up.

## 2024-03-22 NOTE — Progress Notes (Signed)
 Established Patient Office Visit  Subjective   Patient ID: [name], male    DOB: 09/10/1930  Age: 89 y.o. MRN: [id number]  Chief Complaint  Patient presents with   Hypertension    HPI  Discussed the use of AI scribe software for clinical note transcription with the patient, who gave verbal consent to proceed.  History of Present Illness   [name] is a 89 year old male with hypertension who presents for medication adjustment and routine follow-up.  Hypertension - Elevated blood pressure noted recently - Currently taking a combination pill containing valsartan  and hydrochlorothiazide  (320-12.5 mg) and amlodipine  10 mg daily  - Has not been checking blood pressure regularly at home  Respiratory symptoms - Uses Symbicort  inhaler once daily prescribed by Dr. 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 Would like it examined and removed if possible.      ROS Per HPI.    Objective:     BP (!) 139/53   Pulse (!) 42   Ht 5' 8 (1.727 m)   Wt 130 lb 4 oz (59.1 kg)   SpO2 96%   BMI 19.80 kg/m    Physical Exam Constitutional:      General: He is not in acute distress.    Appearance: Normal appearance.  Cardiovascular:     Rate and Rhythm: Regular rhythm. Bradycardia present.     Heart sounds: Normal heart sounds. No murmur heard.    No friction rub. No gallop.  Pulmonary:     Effort: Pulmonary effort is normal. No respiratory distress.     Breath sounds: Normal breath sounds.  Musculoskeletal:        General: No swelling.     Cervical back: Neck supple.  Lymphadenopathy:     Cervical: No cervical adenopathy.  Skin:    General: Skin is warm and dry.     Comments: Crusted, raised lesion  without swelling, discharge or erythema noted to right ear (see media tab for photo)   Neurological:     General: No focal deficit present.     Mental Status: He is alert.  Psychiatric:        Mood and Affect: Mood normal.        Behavior: Behavior normal.        Thought Content: Thought content normal.      No results found for any visits on 03/22/24.  Last CBC Lab Results  Component Value Date   WBC 5.1 09/13/2023   HGB 14.8 09/13/2023   HCT 44.1 09/13/2023   MCV 93 09/13/2023   MCH 31.2 09/13/2023   RDW 12.8 09/13/2023   PLT 183 09/13/2023   Last metabolic panel Lab Results  Component Value Date   GLUCOSE 90 09/13/2023   NA 138 09/13/2023   K 4.8 09/13/2023   CL 99 09/13/2023   CO2 18 (L) 09/13/2023   BUN 13 09/13/2023   CREATININE 1.08 09/13/2023   EGFR 65 09/13/2023   CALCIUM  9.8 09/13/2023   PHOS 3.3 10/31/2021   PROT 7.1 09/13/2023   ALBUMIN 4.3 09/13/2023   LABGLOB 2.8 09/13/2023   AGRATIO 1.5 08/24/2021   BILITOT 0.7 09/13/2023   ALKPHOS 85 09/13/2023  AST 23 09/13/2023   ALT 19 09/13/2023   ANIONGAP 11 05/05/2022   Last lipids Lab Results  Component Value Date   CHOL 182 09/13/2023   HDL 50 09/13/2023   LDLCALC 110 (H) 09/13/2023   TRIG 125 09/13/2023   CHOLHDL 3.6 09/13/2023   Last hemoglobin A1c Lab Results  Component Value Date   HGBA1C 5.7 (H) 02/20/2023   Last thyroid  functions Lab Results  Component Value Date   TSH 1.110 02/20/2023      The ASCVD Risk score (Arnett DK, et al., 2019) failed to calculate for the following reasons:   The 2019 ASCVD risk score is only valid for ages 50 to 54    Assessment & Plan:   Encounter for vaccination -     Flu vaccine HIGH DOSE PF(Fluzone Trivalent)  Essential hypertension Assessment & Plan: BP Goal <130/80. BP above goal in office today and on recheck. Agreeable to increase Diovan  to 325-25 mg. Continue Amlodipine  10 mg daily. Provided with BP log for ambulatory BP monitoring.  Follow up in 4 weeks for HTN follow up.   Orders: -     amLODIPine  Besylate; Take 1 tablet (10 mg total) by mouth daily.  Dispense: 90 tablet; Refill: 1 -     CBC with Differential/Platelet  Moderate persistent asthma, uncomplicated -     Budesonide -Formoterol  Fumarate; Take 2 puffs first thing in am and then another 2 puffs about 12 hours later.  Dispense: 30.6 each; Refill: 3  Prediabetes -     Hemoglobin A1c  Elevated LDL cholesterol level -     Comprehensive metabolic panel with GFR -     Lipid panel  Encounter for immunization -     Flu vaccine trivalent PF, 6mos and older(Flulaval,Afluria,Fluarix,Fluzone)  Chronic asthma, moderate persistent, uncomplicated Assessment & Plan: Follows with Dr. Darlean. Has plenty of albuterol  as he uses it very infrequently. Does need refill on Symbicort .   Skin lesion Assessment & Plan: Skin lesion with overlying crust resembling AK vs cutaneous horn noted to right ear (see media tab for photo). Cryotherapy performed today in clinic. Advised that lesion may turn red and be mildly painful for a few days following the procedure. Advised to follow up if lesion begins to bleed or does not heal or recurs. Discussed referral to dermatology for FBSE but patient declined.    Other orders -     Valsartan -hydroCHLOROthiazide ; Take 1 tablet by mouth daily.  Dispense: 90 tablet; Refill: 3    Return in about 4 weeks (around 04/19/2024) for HTN.    Saddie JULIANNA Sacks, PA-C

## 2024-03-23 LAB — CBC WITH DIFFERENTIAL/PLATELET
Basophils Absolute: 0.1 x10E3/uL (ref 0.0–0.2)
Basos: 1 %
EOS (ABSOLUTE): 0.5 x10E3/uL — ABNORMAL HIGH (ref 0.0–0.4)
Eos: 9 %
Hematocrit: 42.7 % (ref 37.5–51.0)
Hemoglobin: 14.2 g/dL (ref 13.0–17.7)
Immature Grans (Abs): 0 x10E3/uL (ref 0.0–0.1)
Immature Granulocytes: 0 %
Lymphocytes Absolute: 1.2 x10E3/uL (ref 0.7–3.1)
Lymphs: 20 %
MCH: 31.3 pg (ref 26.6–33.0)
MCHC: 33.3 g/dL (ref 31.5–35.7)
MCV: 94 fL (ref 79–97)
Monocytes Absolute: 0.5 x10E3/uL (ref 0.1–0.9)
Monocytes: 8 %
Neutrophils Absolute: 3.6 x10E3/uL (ref 1.4–7.0)
Neutrophils: 62 %
Platelets: 200 x10E3/uL (ref 150–450)
RBC: 4.53 x10E6/uL (ref 4.14–5.80)
RDW: 12.6 % (ref 11.6–15.4)
WBC: 5.8 x10E3/uL (ref 3.4–10.8)

## 2024-03-23 LAB — COMPREHENSIVE METABOLIC PANEL WITH GFR
ALT: 12 IU/L (ref 0–44)
AST: 21 IU/L (ref 0–40)
Albumin: 4.4 g/dL (ref 3.6–4.6)
Alkaline Phosphatase: 69 IU/L (ref 48–129)
BUN/Creatinine Ratio: 13 (ref 10–24)
BUN: 14 mg/dL (ref 10–36)
Bilirubin Total: 0.6 mg/dL (ref 0.0–1.2)
CO2: 23 mmol/L (ref 20–29)
Calcium: 10 mg/dL (ref 8.6–10.2)
Chloride: 98 mmol/L (ref 96–106)
Creatinine, Ser: 1.06 mg/dL (ref 0.76–1.27)
Globulin, Total: 2.8 g/dL (ref 1.5–4.5)
Glucose: 98 mg/dL (ref 70–99)
Potassium: 4.6 mmol/L (ref 3.5–5.2)
Sodium: 136 mmol/L (ref 134–144)
Total Protein: 7.2 g/dL (ref 6.0–8.5)
eGFR: 66 mL/min/1.73 (ref >59)

## 2024-03-23 LAB — LIPID PANEL
Chol/HDL Ratio: 2.9 ratio (ref 0.0–5.0)
Cholesterol, Total: 171 mg/dL (ref 100–199)
HDL: 58 mg/dL (ref >39)
LDL Chol Calc (NIH): 91 mg/dL (ref 0–99)
Triglycerides: 126 mg/dL (ref 0–149)
VLDL Cholesterol Cal: 22 mg/dL (ref 5–40)

## 2024-03-23 LAB — HEMOGLOBIN A1C
Est. average glucose Bld gHb Est-mCnc: 108 mg/dL
Hgb A1c MFr Bld: 5.4 % (ref 4.8–5.6)

## 2024-03-25 ENCOUNTER — Ambulatory Visit: Payer: Self-pay

## 2024-03-25 NOTE — Telephone Encounter (Signed)
 Called and spoke with patient; related lab results.

## 2024-03-25 NOTE — Telephone Encounter (Signed)
 Tried calling the patient; no VM set up.

## 2024-04-18 ENCOUNTER — Ambulatory Visit

## 2024-04-18 VITALS — BP 128/68 | HR 45 | Temp 97.7°F | Ht 68.0 in | Wt 130.1 lb

## 2024-04-18 DIAGNOSIS — I10 Essential (primary) hypertension: Secondary | ICD-10-CM | POA: Diagnosis not present

## 2024-04-18 DIAGNOSIS — L409 Psoriasis, unspecified: Secondary | ICD-10-CM

## 2024-04-18 DIAGNOSIS — L570 Actinic keratosis: Secondary | ICD-10-CM

## 2024-04-18 MED ORDER — TRIAMCINOLONE ACETONIDE 0.1 % EX CREA: 1.0000 | TOPICAL_CREAM | Freq: Two times a day (BID) | CUTANEOUS | 0 refills | Status: AC

## 2024-04-18 NOTE — Patient Instructions (Signed)
 VISIT SUMMARY: You came in today to discuss your blood pressure management and skin concerns. Your blood pressure is generally well-controlled, and we have refilled your prescription for triamcinolone  for your psoriasis. We also discussed monitoring your skin lesions and considering a dermatology referral if needed.  YOUR PLAN: HYPERTENSION: Your blood pressure is generally well-controlled with your current medication. -Continue taking your current blood pressure medication. -Monitor your blood pressure at home regularly. -Report if your blood pressure consistently exceeds 130 mmHg for potential medication adjustment.  PSORIASIS: You have dry patches on your elbows and hips that require topical treatment. -We have refilled your prescription for triamcinolone .  ACTINIC KERATOSIS AND OTHER SKIN LESIONS: You have a persistent lesion on your ear and a new scab-like spot that may need further attention. -Monitor the lesions for any changes or if they do not heal. -Consider a dermatology referral if the lesions persist or worsen.  If you have any problems before your next visit feel free to message me via MyChart (minor issues or questions) or call the office, otherwise you may reach out to schedule an office visit.  Thank you! Saddie Sacks, PA-C

## 2024-04-19 DIAGNOSIS — L570 Actinic keratosis: Secondary | ICD-10-CM | POA: Insufficient documentation

## 2024-04-19 NOTE — Assessment & Plan Note (Signed)
 BP Goal < 130/80. At goal in office today and on ambulatory BP monitoring. Continue Diovan  325-25 mg and amlodipine  10 mg daily. Continue checking BP at home. Report if readings persistently >130 SBP.

## 2024-04-19 NOTE — Assessment & Plan Note (Signed)
 Refilled triamcinolone  0.1% cream for as needed use.

## 2024-04-19 NOTE — Assessment & Plan Note (Signed)
 Presence of skin lesions, including a bleeding lesion on the ear and a new scab-like lesion. Dermatology referral discussed but patient declined multiple times. Discussed risks that the lesions could be / turn into skin cancer. Patient verbalized understanding.  - Advised to monitor lesions for changes or lack of healing. - If/when patient becomes agreeable, will refer back to Dr. Bard Molt at [facility].

## 2024-04-19 NOTE — Progress Notes (Signed)
 "  Established Patient Office Visit  Subjective   Patient ID: [name], male    DOB: Jun 22, 1930  Age: 89 y.o. MRN: [id number]  No chief complaint on file.   HPI  Discussed the use of AI scribe software for clinical note transcription with the patient, who gave verbal consent to proceed.  History of Present Illness   [name] is a 89 year old male with hypertension and psoriasis who presents for blood pressure management and skin concerns.  Hypertension - Monitors blood pressure at home daily - Blood pressure readings mostly in the range of 118-132/70s - Denies side effects from medication  - Denies CP, SOB, edema, HA, or vision changes.   Psoriasiform dermatitis - Uses triamcinolone  for dry patches on elbows and hips - Requires refill of triamcinolone , prefers a large jar for convenience - No need for refills of other medications at this time  Cutaneous lesions - Persistent lesion on ear with occasional bleeding, likely due to picking - New scab-like spot above upper lip appeared in the last few days, possibly from shaving - History of dermatology evaluation with Dr. Bard Molt at [facility] several years ago - Patient declines referral to dermatology today         ROS Per HPI.    Objective:     BP 128/68   Pulse (!) 45   Temp 97.7 F (36.5 C) (Oral)   Ht 5' 8 (1.727 m)   Wt 130 lb 1.9 oz (59 kg)   SpO2 97%   BMI 19.78 kg/m    Physical Exam Constitutional:      General: He is not in acute distress.    Appearance: Normal appearance.  Cardiovascular:     Rate and Rhythm: Normal rate and regular rhythm.     Heart sounds: Normal heart sounds. No murmur heard.    No friction rub. No gallop.  Pulmonary:     Effort: Pulmonary effort is normal. No respiratory distress.     Breath sounds: Normal breath sounds.  Musculoskeletal:        General: No swelling.     Cervical back: Neck supple.  Lymphadenopathy:     Cervical: No  cervical adenopathy.  Skin:    General: Skin is warm and dry.     Comments: Psoriatic rash noted to tops of hands, elbows.  Scattered skin lesions on arms, ears, neck, etc. Most consistent with seborrheic keratoses and actinic keratoses.   Neurological:     General: No focal deficit present.     Mental Status: He is alert.  Psychiatric:        Mood and Affect: Mood normal.        Behavior: Behavior normal.        Thought Content: Thought content normal.      No results found for any visits on 04/18/24.    The ASCVD Risk score (Arnett DK, et al., 2019) failed to calculate for the following reasons:   The 2019 ASCVD risk score is only valid for ages 62 to 32   * - Cholesterol units were assumed    Assessment & Plan:   Essential hypertension Assessment & Plan: BP Goal < 130/80. At goal in office today and on ambulatory BP monitoring. Continue Diovan  325-25 mg and amlodipine  10 mg daily. Continue checking BP at home. Report if readings persistently >130 SBP.    Generalized psoriasis Assessment & Plan: Refilled triamcinolone  0.1% cream for as needed use.  Actinic keratoses Assessment & Plan: Presence of skin lesions, including a bleeding lesion on the ear and a new scab-like lesion. Dermatology referral discussed but patient declined multiple times. Discussed risks that the lesions could be / turn into skin cancer. Patient verbalized understanding.  - Advised to monitor lesions for changes or lack of healing. - If/when patient becomes agreeable, will refer back to Dr. Bard Molt at [facility].       Other orders -     Triamcinolone  Acetonide; Apply 1 Application topically 2 (two) times daily.  Dispense: 545 g; Refill: 0      Return in about 4 months (around 08/17/2024) for HTN, HLD.    Saddie JULIANNA Sacks, PA-C "

## 2024-08-12 ENCOUNTER — Other Ambulatory Visit

## 2024-08-19 ENCOUNTER — Ambulatory Visit
